# Patient Record
Sex: Male | Born: 1965 | Race: White | Hispanic: No | Marital: Single | Smoking: Former smoker
Health system: Southern US, Community
[De-identification: ages and names within clinical notes are randomized; demographics above are authoritative.]

## PROBLEM LIST (undated history)

## (undated) DIAGNOSIS — F419 Anxiety disorder, unspecified: Secondary | ICD-10-CM

## (undated) DIAGNOSIS — I10 Essential (primary) hypertension: Secondary | ICD-10-CM

## (undated) DIAGNOSIS — G4730 Sleep apnea, unspecified: Secondary | ICD-10-CM

## (undated) DIAGNOSIS — F39 Unspecified mood [affective] disorder: Secondary | ICD-10-CM

## (undated) DIAGNOSIS — F319 Bipolar disorder, unspecified: Secondary | ICD-10-CM

---

## 2015-06-05 ENCOUNTER — Other Ambulatory Visit: Payer: Self-pay | Admitting: Orthopedic Surgery

## 2015-06-05 DIAGNOSIS — M47816 Spondylosis without myelopathy or radiculopathy, lumbar region: Secondary | ICD-10-CM

## 2015-06-10 ENCOUNTER — Ambulatory Visit
Admission: RE | Admit: 2015-06-10 | Discharge: 2015-06-10 | Disposition: A | Discharge status: Home / Self Care | Payer: BLUE CROSS/BLUE SHIELD | Source: Ambulatory Visit | Attending: Orthopedic Surgery | Admitting: Orthopedic Surgery

## 2015-06-10 DIAGNOSIS — M47816 Spondylosis without myelopathy or radiculopathy, lumbar region: Secondary | ICD-10-CM

## 2015-11-24 DIAGNOSIS — R030 Elevated blood-pressure reading, without diagnosis of hypertension: Secondary | ICD-10-CM | POA: Diagnosis not present

## 2015-11-24 DIAGNOSIS — R399 Unspecified symptoms and signs involving the genitourinary system: Secondary | ICD-10-CM | POA: Diagnosis not present

## 2015-11-24 DIAGNOSIS — K5900 Constipation, unspecified: Secondary | ICD-10-CM | POA: Diagnosis not present

## 2015-11-24 DIAGNOSIS — N529 Male erectile dysfunction, unspecified: Secondary | ICD-10-CM | POA: Diagnosis not present

## 2015-12-10 DIAGNOSIS — F411 Generalized anxiety disorder: Secondary | ICD-10-CM | POA: Diagnosis not present

## 2016-01-08 DIAGNOSIS — F411 Generalized anxiety disorder: Secondary | ICD-10-CM | POA: Diagnosis not present

## 2016-01-16 DIAGNOSIS — R400 Somnolence: Secondary | ICD-10-CM | POA: Diagnosis not present

## 2016-02-18 DIAGNOSIS — G4733 Obstructive sleep apnea (adult) (pediatric): Secondary | ICD-10-CM | POA: Diagnosis not present

## 2016-03-18 ENCOUNTER — Emergency Department (HOSPITAL_BASED_OUTPATIENT_CLINIC_OR_DEPARTMENT_OTHER)
Admission: EM | Admit: 2016-03-18 | Discharge: 2016-03-18 | Disposition: A | Discharge status: Home / Self Care | Payer: BLUE CROSS/BLUE SHIELD | Attending: Emergency Medicine | Admitting: Emergency Medicine

## 2016-03-18 ENCOUNTER — Encounter (HOSPITAL_BASED_OUTPATIENT_CLINIC_OR_DEPARTMENT_OTHER): Payer: Self-pay | Admitting: *Deleted

## 2016-03-18 DIAGNOSIS — Z79899 Other long term (current) drug therapy: Secondary | ICD-10-CM | POA: Diagnosis not present

## 2016-03-18 DIAGNOSIS — I10 Essential (primary) hypertension: Secondary | ICD-10-CM | POA: Insufficient documentation

## 2016-03-18 DIAGNOSIS — Z760 Encounter for issue of repeat prescription: Secondary | ICD-10-CM | POA: Diagnosis not present

## 2016-03-18 HISTORY — DX: Essential (primary) hypertension: I10

## 2016-03-18 HISTORY — DX: Sleep apnea, unspecified: G47.30

## 2016-03-18 HISTORY — DX: Unspecified mood (affective) disorder: F39

## 2016-03-18 HISTORY — DX: Bipolar disorder, unspecified: F31.9

## 2016-03-18 HISTORY — DX: Anxiety disorder, unspecified: F41.9

## 2016-03-18 MED ORDER — QUETIAPINE FUMARATE 300 MG PO TABS: 300.0000 mg | ORAL_TABLET | Freq: Every day | ORAL | 0 refills | Status: DC

## 2016-03-18 MED ORDER — VENLAFAXINE HCL ER 75 MG PO CP24: 225.0000 mg | ORAL_CAPSULE | Freq: Every evening | ORAL | 0 refills | Status: DC

## 2016-03-18 NOTE — ED Triage Notes (Addendum)
Pt reports out of effexor xr '150mg'$  and seroquel '300mg'$  x 4 days

## 2016-03-18 NOTE — ED Notes (Addendum)
Dr Pauline Good prescriber of medication unable to be reached for refill of medications. Pt reports he has been taking medications for year and missed appointment with provider today and has been attempting to get refill threw his pharmacy without success. Pt further that his therapist is Brent Bulla and they both can be reached at [phone]. Pt states he needs enough medication to get to August 19 th 2017 which is the date that he is scheduled to see the above providers

## 2016-03-18 NOTE — ED Provider Notes (Signed)
[facility] DEPT [facility] Provider Note   CSN: [id number] Arrival date & time: 03/18/16  1925  First Provider Contact:   First MD Initiated Contact with Patient 03/18/16 2134     By signing my name below, I, Julien Nordmann, attest that this documentation has been prepared under the direction and in the presence of [employer], PA-C.  Electronically Signed: Julien Nordmann, ED Scribe. 03/18/16. 9:41 PM.    History   Chief Complaint Chief Complaint  Patient presents with  . Medication Refill   The history is provided by the patient. No language interpreter was used.   HPI Comments: Kevin Hunter is a 50 y.o. male who has a PMHx of anxiety, bipolar disorder, mood disorder, HTN, depression, and sleep apnea presents to the Emergency Department presenting with a request for a medication refill. Pt reports he ran out of effexor xr 150 mg and seroquel 300 mg four days ago. He says he has not been able to rest well this past week after being recently diagnosed with sleep apnea. He notes having an appointment with Dr. Pauline Good today who prescribes his medication but was unable to get his medication today due to being 10 minutes late. He states he has been been very unstable emotionally since he has been off of his medication. He denies HI and SI. Pt does not currently have any pain.  Past Medical History:  Diagnosis Date  . Anxiety   . Bipolar 1 disorder ([place])   . Hypertension   . Mood disorder ([place])   . Sleep apnea     There are no active problems to display for this patient.   History reviewed. No pertinent surgical history.     Home Medications    Prior to Admission medications   Medication Sig Start Date End Date Taking? Authorizing Provider  amLODipine (NORVASC) 5 MG tablet Take 5 mg by mouth daily.   Yes Historical Provider, MD  clonazePAM (KLONOPIN) 1 MG tablet Take 1 mg by mouth 2 (two) times daily.   Yes Historical Provider, MD  linaclotide (LINZESS) 145 MCG CAPS  capsule Take 145 mcg by mouth daily before breakfast.   Yes Historical Provider, MD  tolterodine (DETROL) 2 MG tablet Take 4 mg by mouth 2 (two) times daily.   Yes Historical Provider, MD  traZODone (DESYREL) 100 MG tablet Take 100 mg by mouth at bedtime.   Yes Historical Provider, MD  QUEtiapine (SEROQUEL) 300 MG tablet Take 1 tablet (300 mg total) by mouth at bedtime. 03/18/16   Kalman Drape, PA  venlafaxine XR (EFFEXOR XR) 75 MG 24 hr capsule Take 3 capsules (225 mg total) by mouth every evening. 03/18/16   Kalman Drape, PA    Family History No family history on file.  Social History Social History  Substance Use Topics  . Smoking status: Not on file  . Smokeless tobacco: Not on file  . Alcohol use Not on file     Allergies   Review of patient's allergies indicates not on file.   Review of Systems Review of Systems  Respiratory: Negative for shortness of breath.   Cardiovascular: Negative for chest pain.  Neurological: Negative for headaches.  Psychiatric/Behavioral: Negative for suicidal ideas.     Physical Exam Updated Vital Signs BP 164/100   Pulse 92   Temp 98.1 F (36.7 C) (Oral)   Resp 16   Ht '5\' 10"'$  (1.778 m)   Wt 270 lb (122.5 kg)   SpO2 97%  BMI 38.74 kg/m   Physical Exam  Constitutional: He appears well-developed and well-nourished. No distress.  HENT:  Head: Normocephalic and atraumatic.  Eyes: Conjunctivae are normal.  Pulmonary/Chest: Effort normal. No respiratory distress.  Musculoskeletal: Normal range of motion.  Neurological: He is alert. Coordination normal.  Skin: Skin is warm and dry. He is not diaphoretic.  Psychiatric: He has a normal mood and affect. His behavior is normal.  Nursing note and vitals reviewed.    ED Treatments / Results  DIAGNOSTIC STUDIES: Oxygen Saturation is 97% on RA, normal by my interpretation.  COORDINATION OF CARE:  9:40 PM Discussed treatment plan with pt at bedside and pt agreed to  plan.  Labs (all labs ordered are listed, but only abnormal results are displayed) Labs Reviewed - No data to display  EKG  EKG Interpretation None       Radiology No results found.  Procedures Procedures (including critical care time)  Medications Ordered in ED Medications - No data to display   Initial Impression / Assessment and Plan / ED Course  I have reviewed the triage vital signs and the nursing notes.  Pertinent labs & imaging results that were available during my care of the patient were reviewed by me and considered in my medical decision making (see chart for details).  Clinical Course    Pt here for refill of medication. Medication is not a controlled substance. Will refill medication here. Discussed need to follow up with PCP in 2-3 days.  Pt denies SI or HI. Pt is safe for discharge at this time.  I personally performed the services described in this documentation, which was scribed in my presence. The recorded information has been reviewed and is accurate.     Final Clinical Impressions(s) / ED Diagnoses   Final diagnoses:  Medication refill    New Prescriptions Discharge Medication List as of 03/18/2016  9:47 PM    START taking these medications   Details  venlafaxine XR (EFFEXOR XR) 75 MG 24 hr capsule Take 3 capsules (225 mg total) by mouth every evening., Starting Thu 03/18/2016, [place], [place] 03/18/16 2356    Leo Grosser, MD 03/19/16 [phone]

## 2016-03-18 NOTE — Discharge Instructions (Signed)
Follow up with your primary care provider to have your prescriptions refilled.  Return to the emergency department with any concerning symptoms such as chest pain, shortness of breath, fever, nausea or vomiting.

## 2016-03-31 DIAGNOSIS — F411 Generalized anxiety disorder: Secondary | ICD-10-CM | POA: Diagnosis not present

## 2016-04-08 DIAGNOSIS — G4733 Obstructive sleep apnea (adult) (pediatric): Secondary | ICD-10-CM | POA: Diagnosis not present

## 2016-06-24 DIAGNOSIS — F411 Generalized anxiety disorder: Secondary | ICD-10-CM | POA: Diagnosis not present

## 2016-08-02 DIAGNOSIS — G4700 Insomnia, unspecified: Secondary | ICD-10-CM | POA: Diagnosis not present

## 2016-08-02 DIAGNOSIS — G4733 Obstructive sleep apnea (adult) (pediatric): Secondary | ICD-10-CM | POA: Diagnosis not present

## 2016-09-03 DIAGNOSIS — Z23 Encounter for immunization: Secondary | ICD-10-CM | POA: Diagnosis not present

## 2016-09-21 DIAGNOSIS — F411 Generalized anxiety disorder: Secondary | ICD-10-CM | POA: Diagnosis not present

## 2016-12-03 DIAGNOSIS — R399 Unspecified symptoms and signs involving the genitourinary system: Secondary | ICD-10-CM | POA: Diagnosis not present

## 2016-12-03 DIAGNOSIS — I10 Essential (primary) hypertension: Secondary | ICD-10-CM | POA: Diagnosis not present

## 2016-12-03 DIAGNOSIS — N529 Male erectile dysfunction, unspecified: Secondary | ICD-10-CM | POA: Diagnosis not present

## 2016-12-03 DIAGNOSIS — K5900 Constipation, unspecified: Secondary | ICD-10-CM | POA: Diagnosis not present

## 2017-01-04 DIAGNOSIS — F411 Generalized anxiety disorder: Secondary | ICD-10-CM | POA: Diagnosis not present

## 2017-01-04 DIAGNOSIS — F331 Major depressive disorder, recurrent, moderate: Secondary | ICD-10-CM | POA: Diagnosis not present

## 2017-02-07 DIAGNOSIS — K529 Noninfective gastroenteritis and colitis, unspecified: Secondary | ICD-10-CM | POA: Diagnosis not present

## 2017-02-07 DIAGNOSIS — Z1211 Encounter for screening for malignant neoplasm of colon: Secondary | ICD-10-CM | POA: Diagnosis not present

## 2017-02-07 DIAGNOSIS — K635 Polyp of colon: Secondary | ICD-10-CM | POA: Diagnosis not present

## 2017-02-10 DIAGNOSIS — Z1211 Encounter for screening for malignant neoplasm of colon: Secondary | ICD-10-CM | POA: Diagnosis not present

## 2017-02-10 DIAGNOSIS — K635 Polyp of colon: Secondary | ICD-10-CM | POA: Diagnosis not present

## 2017-02-10 DIAGNOSIS — K529 Noninfective gastroenteritis and colitis, unspecified: Secondary | ICD-10-CM | POA: Diagnosis not present

## 2017-04-11 DIAGNOSIS — F411 Generalized anxiety disorder: Secondary | ICD-10-CM | POA: Diagnosis not present

## 2017-04-11 DIAGNOSIS — F331 Major depressive disorder, recurrent, moderate: Secondary | ICD-10-CM | POA: Diagnosis not present

## 2017-06-29 DIAGNOSIS — N529 Male erectile dysfunction, unspecified: Secondary | ICD-10-CM | POA: Diagnosis not present

## 2017-06-29 DIAGNOSIS — R21 Rash and other nonspecific skin eruption: Secondary | ICD-10-CM | POA: Diagnosis not present

## 2017-06-29 DIAGNOSIS — I10 Essential (primary) hypertension: Secondary | ICD-10-CM | POA: Diagnosis not present

## 2017-06-29 DIAGNOSIS — R0981 Nasal congestion: Secondary | ICD-10-CM | POA: Diagnosis not present

## 2017-08-01 DIAGNOSIS — F411 Generalized anxiety disorder: Secondary | ICD-10-CM | POA: Diagnosis not present

## 2017-12-05 DIAGNOSIS — I10 Essential (primary) hypertension: Secondary | ICD-10-CM | POA: Diagnosis not present

## 2017-12-05 DIAGNOSIS — R399 Unspecified symptoms and signs involving the genitourinary system: Secondary | ICD-10-CM | POA: Diagnosis not present

## 2017-12-05 DIAGNOSIS — N529 Male erectile dysfunction, unspecified: Secondary | ICD-10-CM | POA: Diagnosis not present

## 2017-12-05 DIAGNOSIS — K5900 Constipation, unspecified: Secondary | ICD-10-CM | POA: Diagnosis not present

## 2017-12-07 DIAGNOSIS — F319 Bipolar disorder, unspecified: Secondary | ICD-10-CM | POA: Diagnosis not present

## 2019-01-10 DIAGNOSIS — K5900 Constipation, unspecified: Secondary | ICD-10-CM | POA: Diagnosis not present

## 2019-01-10 DIAGNOSIS — I10 Essential (primary) hypertension: Secondary | ICD-10-CM | POA: Diagnosis not present

## 2019-01-10 DIAGNOSIS — R399 Unspecified symptoms and signs involving the genitourinary system: Secondary | ICD-10-CM | POA: Diagnosis not present

## 2019-01-10 DIAGNOSIS — N529 Male erectile dysfunction, unspecified: Secondary | ICD-10-CM | POA: Diagnosis not present

## 2019-03-22 DIAGNOSIS — F319 Bipolar disorder, unspecified: Secondary | ICD-10-CM | POA: Diagnosis not present

## 2019-06-19 DIAGNOSIS — F319 Bipolar disorder, unspecified: Secondary | ICD-10-CM | POA: Diagnosis not present

## 2019-07-16 DIAGNOSIS — R399 Unspecified symptoms and signs involving the genitourinary system: Secondary | ICD-10-CM | POA: Diagnosis not present

## 2019-07-16 DIAGNOSIS — K5900 Constipation, unspecified: Secondary | ICD-10-CM | POA: Diagnosis not present

## 2019-07-16 DIAGNOSIS — N529 Male erectile dysfunction, unspecified: Secondary | ICD-10-CM | POA: Diagnosis not present

## 2019-07-16 DIAGNOSIS — I10 Essential (primary) hypertension: Secondary | ICD-10-CM | POA: Diagnosis not present

## 2019-08-30 DIAGNOSIS — I10 Essential (primary) hypertension: Secondary | ICD-10-CM | POA: Diagnosis not present

## 2019-09-18 DIAGNOSIS — F319 Bipolar disorder, unspecified: Secondary | ICD-10-CM | POA: Diagnosis not present

## 2019-12-18 DIAGNOSIS — F319 Bipolar disorder, unspecified: Secondary | ICD-10-CM | POA: Diagnosis not present

## 2020-03-19 DIAGNOSIS — F319 Bipolar disorder, unspecified: Secondary | ICD-10-CM | POA: Diagnosis not present

## 2020-05-06 DIAGNOSIS — N529 Male erectile dysfunction, unspecified: Secondary | ICD-10-CM | POA: Diagnosis not present

## 2020-05-06 DIAGNOSIS — R399 Unspecified symptoms and signs involving the genitourinary system: Secondary | ICD-10-CM | POA: Diagnosis not present

## 2020-05-06 DIAGNOSIS — I10 Essential (primary) hypertension: Secondary | ICD-10-CM | POA: Diagnosis not present

## 2020-05-06 DIAGNOSIS — Z23 Encounter for immunization: Secondary | ICD-10-CM | POA: Diagnosis not present

## 2020-05-06 DIAGNOSIS — K5900 Constipation, unspecified: Secondary | ICD-10-CM | POA: Diagnosis not present

## 2021-05-20 ENCOUNTER — Emergency Department (HOSPITAL_BASED_OUTPATIENT_CLINIC_OR_DEPARTMENT_OTHER): Payer: Managed Care, Other (non HMO)

## 2021-05-20 ENCOUNTER — Inpatient Hospital Stay (HOSPITAL_BASED_OUTPATIENT_CLINIC_OR_DEPARTMENT_OTHER)
Admission: EM | Admit: 2021-05-20 | Discharge: 2021-05-23 | DRG: 166 | Disposition: A | Discharge status: Home / Self Care | Payer: Managed Care, Other (non HMO) | Attending: Internal Medicine | Admitting: Internal Medicine

## 2021-05-20 ENCOUNTER — Other Ambulatory Visit: Payer: Self-pay

## 2021-05-20 ENCOUNTER — Encounter (HOSPITAL_BASED_OUTPATIENT_CLINIC_OR_DEPARTMENT_OTHER): Payer: Self-pay

## 2021-05-20 DIAGNOSIS — F411 Generalized anxiety disorder: Secondary | ICD-10-CM | POA: Diagnosis present

## 2021-05-20 DIAGNOSIS — C3411 Malignant neoplasm of upper lobe, right bronchus or lung: Principal | ICD-10-CM | POA: Diagnosis present

## 2021-05-20 DIAGNOSIS — C7931 Secondary malignant neoplasm of brain: Secondary | ICD-10-CM | POA: Diagnosis present

## 2021-05-20 DIAGNOSIS — G936 Cerebral edema: Secondary | ICD-10-CM | POA: Diagnosis present

## 2021-05-20 DIAGNOSIS — Z79899 Other long term (current) drug therapy: Secondary | ICD-10-CM

## 2021-05-20 DIAGNOSIS — Z6841 Body Mass Index (BMI) 40.0 and over, adult: Secondary | ICD-10-CM

## 2021-05-20 DIAGNOSIS — G4730 Sleep apnea, unspecified: Secondary | ICD-10-CM | POA: Diagnosis present

## 2021-05-20 DIAGNOSIS — Z87891 Personal history of nicotine dependence: Secondary | ICD-10-CM

## 2021-05-20 DIAGNOSIS — F319 Bipolar disorder, unspecified: Secondary | ICD-10-CM | POA: Diagnosis present

## 2021-05-20 DIAGNOSIS — Z419 Encounter for procedure for purposes other than remedying health state, unspecified: Secondary | ICD-10-CM

## 2021-05-20 DIAGNOSIS — I10 Essential (primary) hypertension: Secondary | ICD-10-CM | POA: Diagnosis present

## 2021-05-20 DIAGNOSIS — R918 Other nonspecific abnormal finding of lung field: Secondary | ICD-10-CM

## 2021-05-20 DIAGNOSIS — E871 Hypo-osmolality and hyponatremia: Secondary | ICD-10-CM | POA: Diagnosis present

## 2021-05-20 DIAGNOSIS — D496 Neoplasm of unspecified behavior of brain: Secondary | ICD-10-CM | POA: Insufficient documentation

## 2021-05-20 DIAGNOSIS — Z880 Allergy status to penicillin: Secondary | ICD-10-CM

## 2021-05-20 DIAGNOSIS — Z9889 Other specified postprocedural states: Secondary | ICD-10-CM

## 2021-05-20 DIAGNOSIS — C799 Secondary malignant neoplasm of unspecified site: Secondary | ICD-10-CM

## 2021-05-20 DIAGNOSIS — Z20822 Contact with and (suspected) exposure to covid-19: Secondary | ICD-10-CM | POA: Diagnosis present

## 2021-05-20 DIAGNOSIS — R519 Headache, unspecified: Secondary | ICD-10-CM

## 2021-05-20 DIAGNOSIS — F419 Anxiety disorder, unspecified: Secondary | ICD-10-CM | POA: Diagnosis present

## 2021-05-20 DIAGNOSIS — E669 Obesity, unspecified: Secondary | ICD-10-CM | POA: Diagnosis present

## 2021-05-20 DIAGNOSIS — R040 Epistaxis: Secondary | ICD-10-CM | POA: Diagnosis not present

## 2021-05-20 LAB — CBC WITH DIFFERENTIAL/PLATELET
Abs Immature Granulocytes: 0.05 10*3/uL (ref 0.00–0.07)
Basophils Absolute: 0.1 10*3/uL (ref 0.0–0.1)
Basophils Relative: 1 %
Eosinophils Absolute: 0.2 10*3/uL (ref 0.0–0.5)
Eosinophils Relative: 2 %
HCT: 42.1 % (ref 39.0–52.0)
Hemoglobin: 14.5 g/dL (ref 13.0–17.0)
Immature Granulocytes: 1 %
Lymphocytes Relative: 15 %
Lymphs Abs: 1.5 10*3/uL (ref 0.7–4.0)
MCH: 29.9 pg (ref 26.0–34.0)
MCHC: 34.4 g/dL (ref 30.0–36.0)
MCV: 86.8 fL (ref 80.0–100.0)
Monocytes Absolute: 0.9 10*3/uL (ref 0.1–1.0)
Monocytes Relative: 9 %
Neutro Abs: 7.2 10*3/uL (ref 1.7–7.7)
Neutrophils Relative %: 72 %
Platelets: 261 10*3/uL (ref 150–400)
RBC: 4.85 MIL/uL (ref 4.22–5.81)
RDW: 14.4 % (ref 11.5–15.5)
WBC: 9.7 10*3/uL (ref 4.0–10.5)
nRBC: 0 % (ref 0.0–0.2)

## 2021-05-20 LAB — COMPREHENSIVE METABOLIC PANEL
ALT: 24 U/L (ref 0–44)
AST: 22 U/L (ref 15–41)
Albumin: 4.1 g/dL (ref 3.5–5.0)
Alkaline Phosphatase: 72 U/L (ref 38–126)
Anion gap: 7 (ref 5–15)
BUN: 16 mg/dL (ref 6–20)
CO2: 24 mmol/L (ref 22–32)
Calcium: 8.8 mg/dL — ABNORMAL LOW (ref 8.9–10.3)
Chloride: 102 mmol/L (ref 98–111)
Creatinine, Ser: 1.14 mg/dL (ref 0.61–1.24)
GFR, Estimated: >60 mL/min (ref >60)
Glucose, Bld: 91 mg/dL (ref 70–99)
Potassium: 3.4 mmol/L — ABNORMAL LOW (ref 3.5–5.1)
Sodium: 133 mmol/L — ABNORMAL LOW (ref 135–145)
Total Bilirubin: 0.3 mg/dL (ref 0.3–1.2)
Total Protein: 7.8 g/dL (ref 6.5–8.1)

## 2021-05-20 LAB — RESP PANEL BY RT-PCR (FLU A&B, COVID) ARPGX2
Influenza A by PCR: NEGATIVE
Influenza B by PCR: NEGATIVE
SARS Coronavirus 2 by RT PCR: NEGATIVE

## 2021-05-20 LAB — TSH: TSH: 1.802 u[IU]/mL (ref 0.350–4.500)

## 2021-05-20 LAB — LIPASE, BLOOD: Lipase: 31 U/L (ref 11–51)

## 2021-05-20 IMAGING — CT CT HEAD W/O CM
3 series · 15 of 47 positions shown, 18 images · non-contrast
Comparison: None.

CLINICAL DATA: Three weeks of headache, dizziness, trouble writing
and typing

EXAM:
CT HEAD WITHOUT CONTRAST
TECHNIQUE: Contiguous axial images were obtained from the base of the skull
through the vertex without intravenous contrast.

[Series 2: head wo · axial · 0.44mm/px · z∈[-178,-43]mm · 9 of 33 slices shown, 12 images]
[im 3/33  brain]
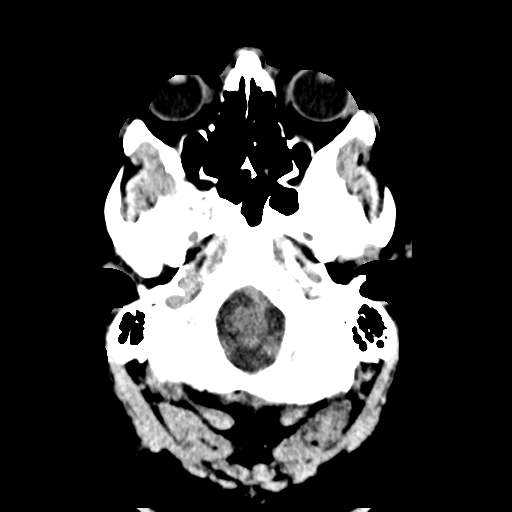
[im 3/33  bone]
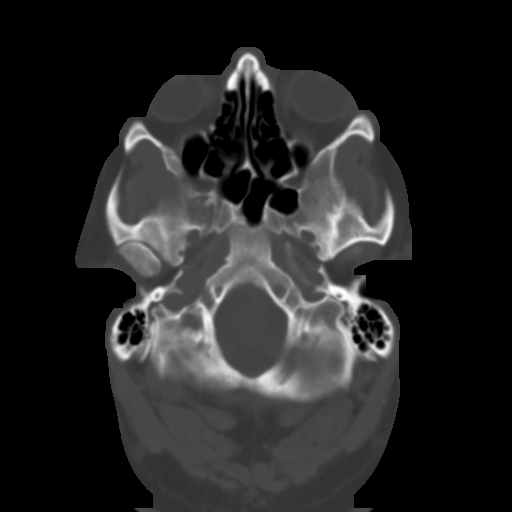
[im 6/33  brain]
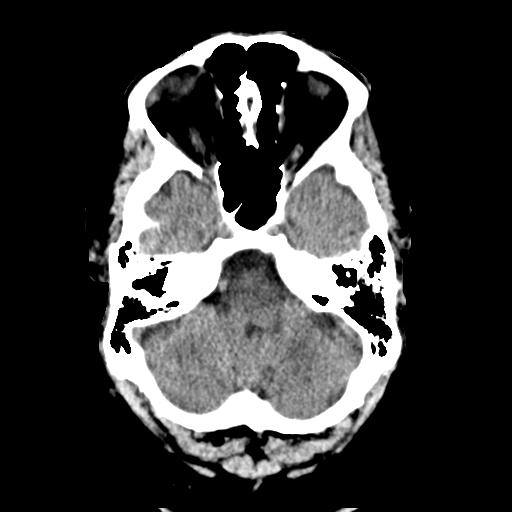
[im 9/33  brain]
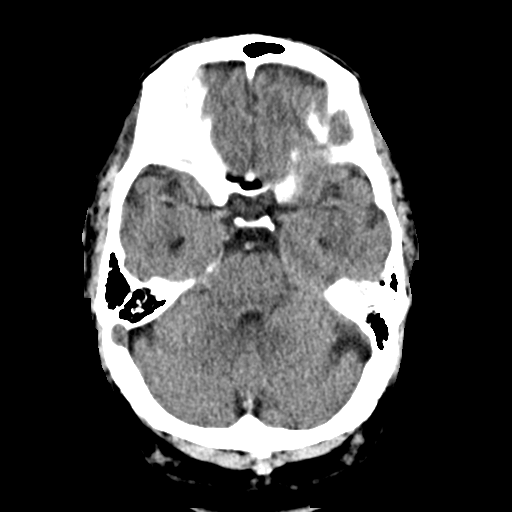
[im 13/33  brain]
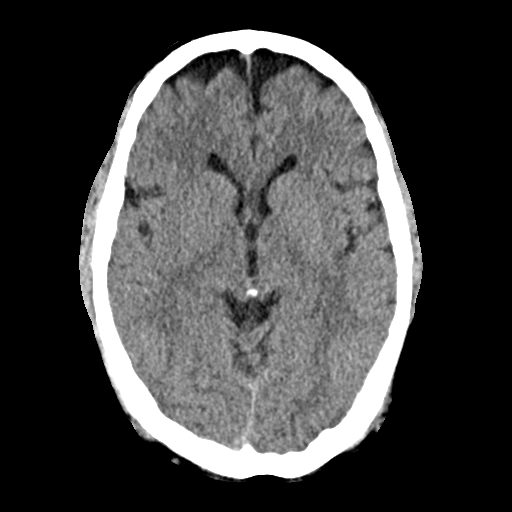
[im 17/33  brain]
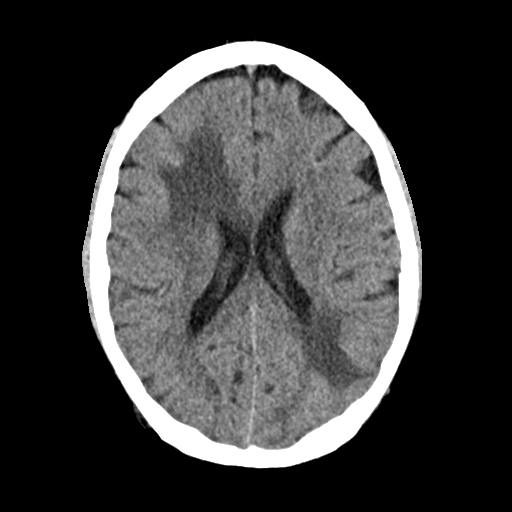
[im 17/33  bone]
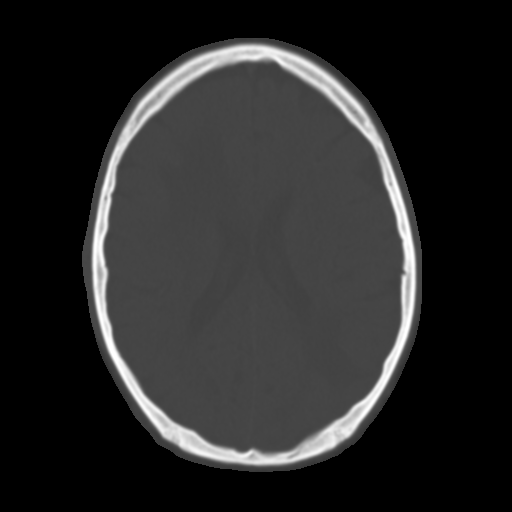
[im 20/33  brain]
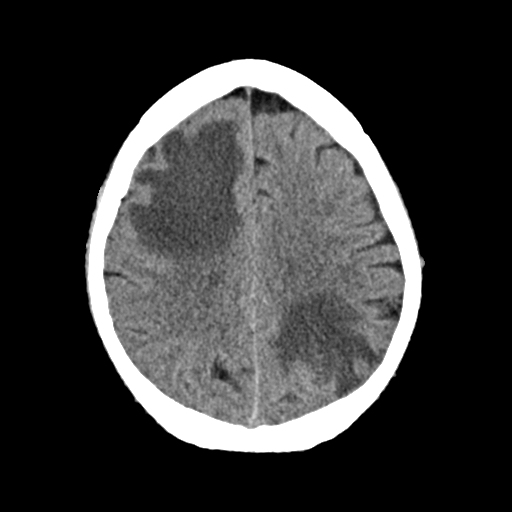
[im 24/33  brain]
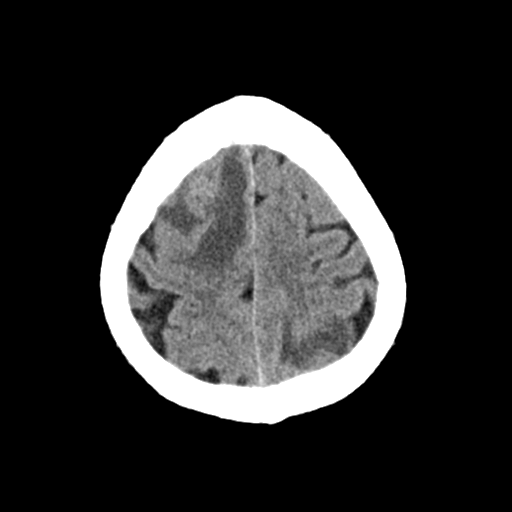
[im 27/33  brain]
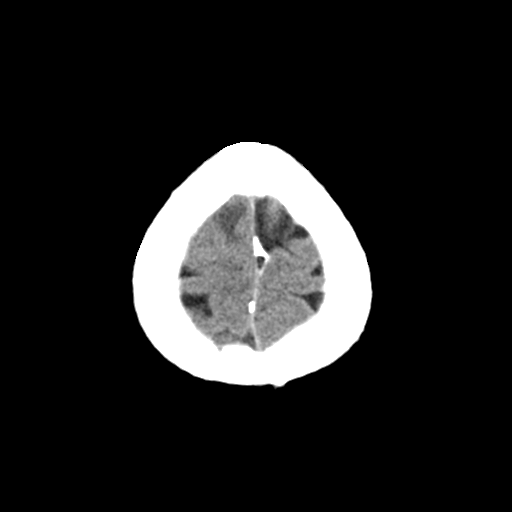
[im 30/33  brain]
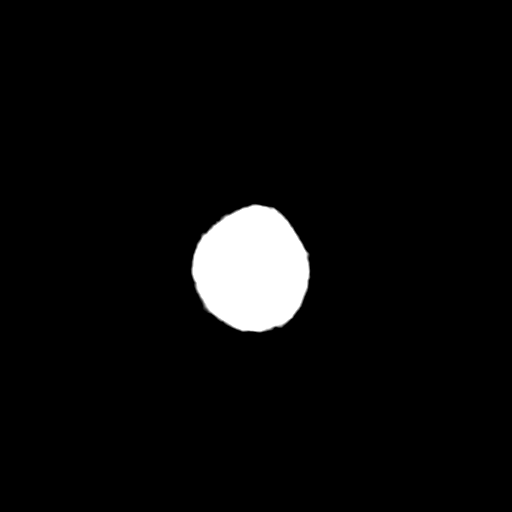
[im 30/33  bone]
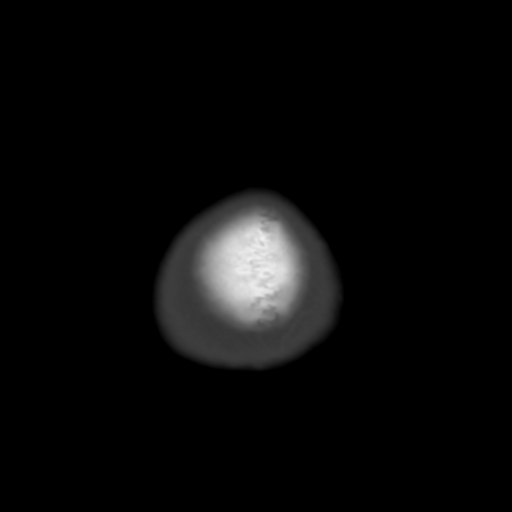

[Series 4: coronal soft · coronal · 0.34mm/px · 3 of 72 slices shown]
[im 24/72  brain]
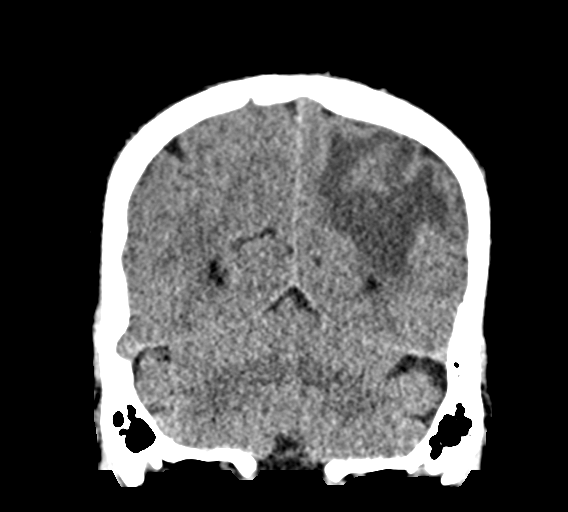
[im 32/72  brain]
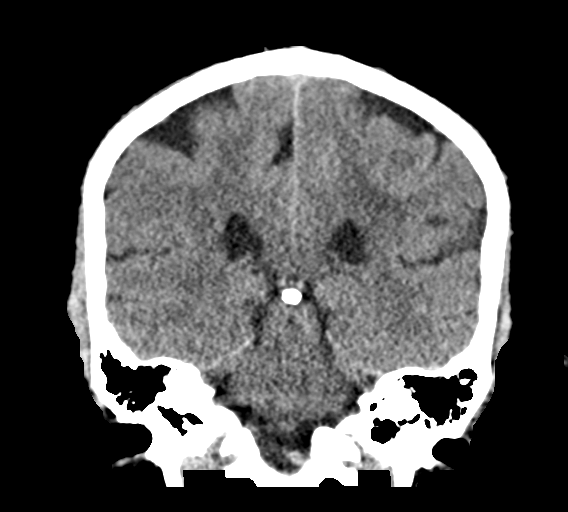
[im 40/72  brain]
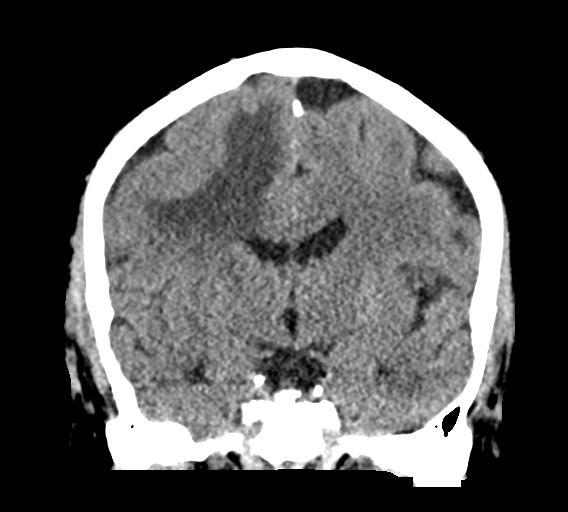

[Series 5: sag soft · sagittal · 0.34mm/px · 3 of 58 slices shown]
[im 20/58  brain]
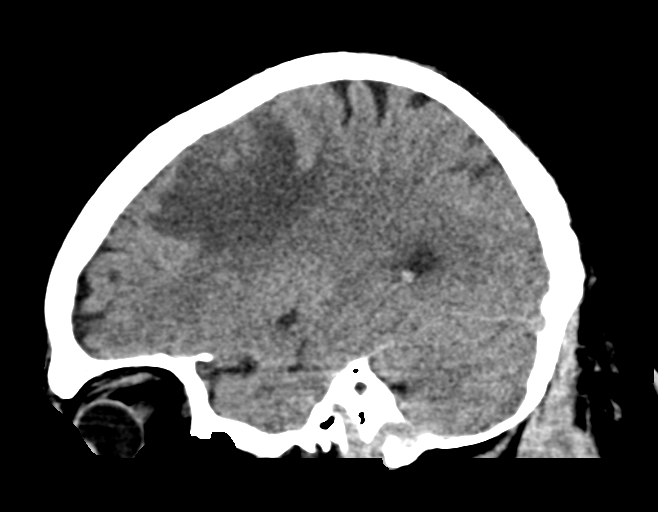
[im 29/58  brain]
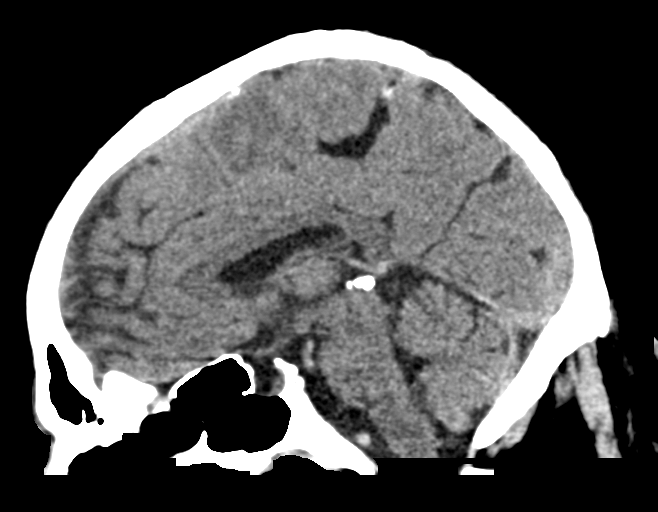
[im 39/58  brain]
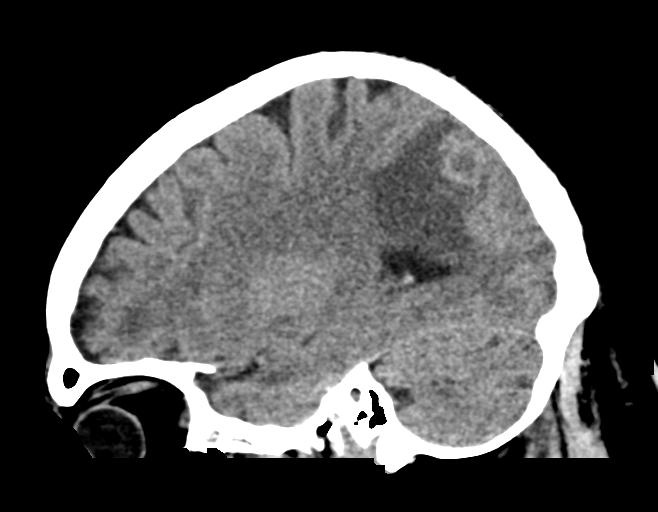

[15 of 47 positions shown; findings below may reference images not displayed]

FINDINGS: Brain: There is a 1.5 cm centrally hypodense lesion in the right
frontal lobe with significant surrounding vasogenic edema with
sparing of the overlying white matter. There is a similar appearing
lesion in the left parietal lobe measuring 1.2 cm with surrounding
edema.

There is mild vasogenic edema in the left inferior frontal gyrus
(4-60).

There is no evidence of acute intracranial hemorrhage or infarct.
There is no extra-axial fluid collection

Mass effect from the above described edema slightly effaces the body
of the right lateral ventricle. There is no midline shift.

Vascular: No hyperdense vessel or unexpected calcification.

Skull: Normal. Negative for fracture or focal lesion.

Sinuses/Orbits: The imaged paranasal sinuses are clear. The globes
and orbits are unremarkable.

Other: None.
IMPRESSION: Discrete parenchymal lesions in the right frontal and left parietal
lobes with marked surrounding vasogenic edema described above most
suspicious for metastases. Mild vasogenic edema in the left inferior
frontal gyrus raises suspicion for an additional lesion in this
location. Brain MRI with and without contrast is recommended for
further characterization.

## 2021-05-20 MED ORDER — CLONAZEPAM 0.5 MG PO TABS
1.0000 mg | ORAL_TABLET | Freq: Two times a day (BID) | ORAL | Status: DC
  Filled 2021-05-20: qty 1
  Filled 2021-05-20: qty 2

## 2021-05-20 MED ORDER — DEXAMETHASONE SODIUM PHOSPHATE 10 MG/ML IJ SOLN
10.0000 mg | Freq: Once | INTRAMUSCULAR | Status: AC
  Administered 2021-05-20: 10 mg via INTRAVENOUS
  Filled 2021-05-20: qty 1

## 2021-05-20 MED ORDER — LORAZEPAM 0.5 MG PO TABS
0.5000 mg | ORAL_TABLET | Freq: Four times a day (QID) | ORAL | Status: DC | PRN
  Administered 2021-05-20: 0.5 mg via ORAL
  Filled 2021-05-20: qty 1

## 2021-05-20 MED ORDER — SODIUM CHLORIDE 0.9 % IV SOLN: 2000.0000 mg | Freq: Once | INTRAVENOUS | Status: DC

## 2021-05-20 MED ORDER — LEVETIRACETAM IN NACL 1000 MG/100ML IV SOLN
1000.0000 mg | Freq: Once | INTRAVENOUS | Status: AC
  Administered 2021-05-20: 1000 mg via INTRAVENOUS
  Filled 2021-05-20: qty 100

## 2021-05-20 MED ORDER — IOHEXOL 300 MG/ML  SOLN
100.0000 mL | Freq: Once | INTRAMUSCULAR | Status: AC | PRN
  Administered 2021-05-20: 100 mL via INTRAVENOUS

## 2021-05-20 MED ORDER — QUETIAPINE FUMARATE 100 MG PO TABS
300.0000 mg | ORAL_TABLET | Freq: Every day | ORAL | Status: DC
  Administered 2021-05-20 – 2021-05-22 (×3): 300 mg via ORAL
  Filled 2021-05-20 (×3): qty 3

## 2021-05-20 MED ORDER — AMLODIPINE BESYLATE 5 MG PO TABS
5.0000 mg | ORAL_TABLET | Freq: Every day | ORAL | Status: DC
  Administered 2021-05-20 – 2021-05-23 (×4): 5 mg via ORAL
  Filled 2021-05-20 (×4): qty 1

## 2021-05-20 MED ORDER — DEXAMETHASONE SODIUM PHOSPHATE 4 MG/ML IJ SOLN
4.0000 mg | Freq: Four times a day (QID) | INTRAMUSCULAR | Status: DC
  Administered 2021-05-20 – 2021-05-21 (×2): 4 mg via INTRAVENOUS
  Filled 2021-05-20 (×3): qty 1

## 2021-05-20 MED ORDER — TRAZODONE HCL 100 MG PO TABS
100.0000 mg | ORAL_TABLET | Freq: Every day | ORAL | Status: DC
  Administered 2021-05-20 – 2021-05-22 (×3): 100 mg via ORAL
  Filled 2021-05-20 (×2): qty 1
  Filled 2021-05-20: qty 2

## 2021-05-20 MED ORDER — LEVETIRACETAM IN NACL 500 MG/100ML IV SOLN
500.0000 mg | Freq: Two times a day (BID) | INTRAVENOUS | Status: DC
  Filled 2021-05-20 (×2): qty 100

## 2021-05-20 MED ORDER — VENLAFAXINE HCL ER 75 MG PO CP24
225.0000 mg | ORAL_CAPSULE | Freq: Every evening | ORAL | Status: DC
  Filled 2021-05-20: qty 1

## 2021-05-20 NOTE — ED Triage Notes (Signed)
Pt c/o HA, dizziness, and misspelling words when typing x ~1 month-states he was seen by PCP-no tests and was advised to increase trazadone-pt states today sx was worse while at work today-NAD-steady gait

## 2021-05-20 NOTE — ED Provider Notes (Signed)
Emergency Department Provider Note   I have reviewed the triage vital signs and the nursing notes.   HISTORY  Chief Complaint Headache   HPI Kevin Hunter is a 55 y.o. male with PMH reviewed below presents to the emergency department with intermittent right-sided headache and various neurologic complaints.  Patient has noticed intermittent headaches for the past month which is unusual to him.  He frequently gets a throbbing, right-sided headache in the right forehead area.  Denies any sudden onset, maximal intensity headache symptoms.  No photophobia.  No nausea or vomiting.  He was occasionally feeling lightheaded but denies chest pain or heart palpitations.  He tells me that he noticed around 2 weeks ago that he was having a more cautious gait and thought maybe his left leg was more weak than the right.  Around this time he also noticed that he would have difficulty spelling certain words which was unusual for him.  He would be taking notes and realized that he was misspelling multiple things.  He was at a meeting today and was typing on a screen in front of multiple people who noticed him misspelling multiple words and ultimately asked him what was going on.  This prompted him to come to the emergency department.  He was not having a headache at the time of spelling issues.  He did have a headache earlier this morning but it improved with Sudafed.  Denies any URI symptoms.  Denies numbness, vision change, speech change.   Past Medical History:  Diagnosis Date   Anxiety    Bipolar 1 disorder ([place])    Hypertension    Mood disorder ([place])    Sleep apnea     Patient Active Problem List   Diagnosis Date Noted   Brain metastasis ([place]) 05/21/2021   Hyponatremia 05/21/2021   Bipolar 1 disorder ([place])    Anxiety    Hypertension    Brain tumor ([place]) 05/20/2021     History reviewed. No pertinent surgical history.  Allergies Penicillins  History reviewed. No pertinent family  history.  Social History Social History   Tobacco Use   Smoking status: Former    Types: Cigarettes   Smokeless tobacco: Never  Vaping Use   Vaping Use: Every day  Substance Use Topics   Alcohol use: Never   Drug use: Never    Review of Systems  Constitutional: No fever/chills Eyes: No visual changes. ENT: No sore throat. Cardiovascular: Denies chest pain. Respiratory: Denies shortness of breath. Gastrointestinal: No abdominal pain.  No nausea, no vomiting.  No diarrhea.  No constipation. Genitourinary: Negative for dysuria. Musculoskeletal: Negative for back pain. Skin: Negative for rash. Neurological: Negative for focal weakness or numbness. Positive HA. Positive issues with writing/typing.   10-point ROS otherwise negative.  ____________________________________________   PHYSICAL EXAM:  VITAL SIGNS: ED Triage Vitals  Enc Vitals Group     BP 05/20/21 1207 (!) 164/110     Pulse Rate 05/20/21 1207 83     Resp 05/20/21 1207 20     Temp 05/20/21 1207 98.3 F (36.8 C)     Temp Source 05/20/21 1207 Oral     SpO2 05/20/21 1207 95 %     Weight 05/20/21 1205 296 lb (134.3 kg)     Height 05/20/21 1205 5\' 10"  (1.778 m)   Constitutional: Alert and oriented. Well appearing and in no acute distress. Eyes: Conjunctivae are normal.  Head: Atraumatic. Nose: No congestion/rhinnorhea. Mouth/Throat: Mucous membranes are moist.   Neck:  No stridor.  Cardiovascular: Normal rate, regular rhythm. Good peripheral circulation. Grossly normal heart sounds.   Respiratory: Normal respiratory effort.  No retractions. Lungs CTAB. Gastrointestinal: Soft and nontender. No distention.  Musculoskeletal: No lower extremity tenderness nor edema. No gross deformities of extremities. Neurologic:  Normal speech and language. No gross focal neurologic deficits are appreciated. No facial asymmetry. 4+/5 strength in the LUE and LLE. 5/5 on the RLE.  Skin:  Skin is warm, dry and intact. No rash  noted.  ____________________________________________   LABS (all labs ordered are listed, but only abnormal results are displayed)  Labs Reviewed  COMPREHENSIVE METABOLIC PANEL - Abnormal; Notable for the following components:      Result Value   Sodium 133 (*)    Potassium 3.4 (*)    Calcium 8.8 (*)    All other components within normal limits  COMPREHENSIVE METABOLIC PANEL - Abnormal; Notable for the following components:   Glucose, Bld 122 (*)    Total Protein 8.3 (*)    All other components within normal limits  CBC - Abnormal; Notable for the following components:   WBC 11.3 (*)    All other components within normal limits  RESP PANEL BY RT-PCR (FLU A&B, COVID) ARPGX2  LIPASE, BLOOD  CBC WITH DIFFERENTIAL/PLATELET  TSH  MAGNESIUM  OSMOLALITY  HIV ANTIBODY (ROUTINE TESTING W REFLEX)  URINALYSIS, ROUTINE W REFLEX MICROSCOPIC  SODIUM, URINE, RANDOM  CREATININE, URINE, RANDOM  OSMOLALITY, URINE   ____________________________________________  RADIOLOGY  CT head:   IMPRESSION: Discrete parenchymal lesions in the right frontal and left parietal lobes with marked surrounding vasogenic edema described above most suspicious for metastases. Mild vasogenic edema in the left inferior frontal gyrus raises suspicion for an additional lesion in this location. Brain MRI with and without contrast is recommended for further characterization.   Electronically Signed By: Valetta Mole M.D. On: 05/20/2021 13:49 ____________________________________________   PROCEDURES  Procedure(s) performed:   Procedures  None ____________________________________________   INITIAL IMPRESSION / ASSESSMENT AND PLAN / ED COURSE  Pertinent labs & imaging results that were available during my care of the patient were reviewed by me and considered in my medical decision making (see chart for details).   Patient presents emergency department with 2 to 4 weeks of intermittent headaches with  neurologic symptoms as described above.  He has no significant focal neurologic deficits.  Some subjective weakness in the left leg at times but difficult to fully appreciate this on exam.  Plan for initial noncontrast head CT imaging.  No history features to suspect subarachnoid hemorrhage, intracranial mass.  Stroke does remain on my differential.  It sounds like his issues with spelling and taking notes has been ongoing for the past 2 weeks but ultimately noticed by coworkers today which brought him to care.   02:15 PM  Discussed the case with Dr. Malen Gauze.  Advises on Decadron and Keppra dosing.  Will send patient for CT chest/abdomen/pelvis to look for area of primary cancer, presuming metastatic disease.  Patient will need transfer/admit for MRI brain with/without contrast along with oncology and neurosurgical consultation. No airway concern. Updated patient on CT findings and plan moving forward.   Discussed patient's case with [facility] to request admission. Patient and family (if present) updated with plan. Care transferred to [facility] service.  I reviewed all nursing notes, vitals, pertinent old records, EKGs, labs, imaging (as available).  ____________________________________________  FINAL CLINICAL IMPRESSION(S) / ED DIAGNOSES  Final diagnoses:  Metastasis ([place])  Acute nonintractable headache, unspecified  headache type     MEDICATIONS GIVEN DURING THIS VISIT:  Medications  dexamethasone (DECADRON) injection 4 mg (4 mg Intravenous Given 05/21/21 0515)  levETIRAcetam (KEPPRA) IVPB 500 mg/100 mL premix (has no administration in time range)  amLODipine (NORVASC) tablet 5 mg (5 mg Oral Given 05/20/21 2214)  clonazePAM (KLONOPIN) tablet 1 mg (1 mg Oral Not Given 05/20/21 2215)  QUEtiapine (SEROQUEL) tablet 300 mg (300 mg Oral Given 05/20/21 2216)  traZODone (DESYREL) tablet 100 mg (100 mg Oral Given 05/20/21 2216)  LORazepam (ATIVAN) tablet 0.5 mg (0.5 mg Oral Given 05/20/21 2216)  acetaminophen  (TYLENOL) tablet 650 mg (has no administration in time range)    Or  acetaminophen (TYLENOL) suppository 650 mg (has no administration in time range)  dexamethasone (DECADRON) injection 10 mg (10 mg Intravenous Given 05/20/21 1442)  levETIRAcetam (KEPPRA) IVPB 1000 mg/100 mL premix (0 mg Intravenous Stopped 05/20/21 1503)    And  levETIRAcetam (KEPPRA) IVPB 1000 mg/100 mL premix (0 mg Intravenous Stopped 05/20/21 1501)  iohexol (OMNIPAQUE) 300 MG/ML solution 100 mL (100 mLs Intravenous Contrast Given 05/20/21 1427)  gadobutrol (GADAVIST) 1 MMOL/ML injection 10 mL (10 mLs Intravenous Contrast Given 05/21/21 0656)      Note:  This document was prepared using Dragon voice recognition software and may include unintentional dictation errors.  Nanda Quinton, MD, [facility] Emergency [facility]    Kaneisha Ellenberger, [facility], MD 05/21/21 [phone]

## 2021-05-20 NOTE — ED Notes (Signed)
Patient transported to CT 

## 2021-05-20 NOTE — Plan of Care (Addendum)
ON CALL PHONE NOTE  Called by Dr Laverta [place] at [facility]. Patient presenting with 2 weeks of symptoms of misspelling words, headache and dizziness which was very apparent today when he was in a meeting and was asked to come get checked out.  CT head revealing of multiple areas of vasogenic edema concerning for metastatic process.  Impression: Likely brain metastases  Recs: -Oncological work-up-CT chest abdomen pelvis to look for a primary, MRI of the brain with and without contrast when he is at [facility], oncology and neurosurgical consultation. -Decadron 10 mg IV x1 followed by Decadron 4 mg every 6 hours.  I would also recommend putting him on Keppra 500 twice daily after giving him a 2000 mg load of Keppra x1.  I am not sure if an inpatient neurological consultation is necessary-he needs oncology and neurosurgery consultations as inpatient and should follow up with neuro-oncology as outpatient if needed.  Please call inpatient neurology service as needed.  -- Amie [place], MD Neurologist [facility] Pager: [phone]

## 2021-05-21 ENCOUNTER — Inpatient Hospital Stay (HOSPITAL_COMMUNITY): Payer: Managed Care, Other (non HMO)

## 2021-05-21 ENCOUNTER — Encounter (HOSPITAL_COMMUNITY): Payer: Self-pay | Admitting: Internal Medicine

## 2021-05-21 ENCOUNTER — Other Ambulatory Visit: Payer: Self-pay | Admitting: Radiation Therapy

## 2021-05-21 DIAGNOSIS — R519 Headache, unspecified: Secondary | ICD-10-CM | POA: Diagnosis not present

## 2021-05-21 DIAGNOSIS — E669 Obesity, unspecified: Secondary | ICD-10-CM | POA: Diagnosis present

## 2021-05-21 DIAGNOSIS — I10 Essential (primary) hypertension: Secondary | ICD-10-CM | POA: Diagnosis present

## 2021-05-21 DIAGNOSIS — R918 Other nonspecific abnormal finding of lung field: Secondary | ICD-10-CM

## 2021-05-21 DIAGNOSIS — C7931 Secondary malignant neoplasm of brain: Secondary | ICD-10-CM

## 2021-05-21 DIAGNOSIS — Z87891 Personal history of nicotine dependence: Secondary | ICD-10-CM | POA: Diagnosis not present

## 2021-05-21 DIAGNOSIS — Z6841 Body Mass Index (BMI) 40.0 and over, adult: Secondary | ICD-10-CM | POA: Diagnosis not present

## 2021-05-21 DIAGNOSIS — C3411 Malignant neoplasm of upper lobe, right bronchus or lung: Secondary | ICD-10-CM | POA: Diagnosis present

## 2021-05-21 DIAGNOSIS — Z20822 Contact with and (suspected) exposure to covid-19: Secondary | ICD-10-CM | POA: Diagnosis present

## 2021-05-21 DIAGNOSIS — Z880 Allergy status to penicillin: Secondary | ICD-10-CM | POA: Diagnosis not present

## 2021-05-21 DIAGNOSIS — F319 Bipolar disorder, unspecified: Secondary | ICD-10-CM | POA: Diagnosis not present

## 2021-05-21 DIAGNOSIS — R040 Epistaxis: Secondary | ICD-10-CM | POA: Diagnosis not present

## 2021-05-21 DIAGNOSIS — E871 Hypo-osmolality and hyponatremia: Secondary | ICD-10-CM

## 2021-05-21 DIAGNOSIS — F411 Generalized anxiety disorder: Secondary | ICD-10-CM | POA: Diagnosis present

## 2021-05-21 DIAGNOSIS — F419 Anxiety disorder, unspecified: Secondary | ICD-10-CM | POA: Diagnosis not present

## 2021-05-21 DIAGNOSIS — Z79899 Other long term (current) drug therapy: Secondary | ICD-10-CM | POA: Diagnosis not present

## 2021-05-21 DIAGNOSIS — G936 Cerebral edema: Secondary | ICD-10-CM | POA: Diagnosis present

## 2021-05-21 DIAGNOSIS — G4730 Sleep apnea, unspecified: Secondary | ICD-10-CM | POA: Diagnosis present

## 2021-05-21 LAB — CBC
HCT: 45.4 % (ref 39.0–52.0)
Hemoglobin: 15.7 g/dL (ref 13.0–17.0)
MCH: 29.9 pg (ref 26.0–34.0)
MCHC: 34.6 g/dL (ref 30.0–36.0)
MCV: 86.5 fL (ref 80.0–100.0)
Platelets: 280 10*3/uL (ref 150–400)
RBC: 5.25 MIL/uL (ref 4.22–5.81)
RDW: 14.3 % (ref 11.5–15.5)
WBC: 11.3 10*3/uL — ABNORMAL HIGH (ref 4.0–10.5)
nRBC: 0 % (ref 0.0–0.2)

## 2021-05-21 LAB — CREATININE, URINE, RANDOM: Creatinine, Urine: 225.47 mg/dL

## 2021-05-21 LAB — RAPID URINE DRUG SCREEN, HOSP PERFORMED
Amphetamines: NOT DETECTED
Barbiturates: NOT DETECTED
Benzodiazepines: NOT DETECTED
Cocaine: NOT DETECTED
Opiates: NOT DETECTED
Tetrahydrocannabinol: NOT DETECTED

## 2021-05-21 LAB — COMPREHENSIVE METABOLIC PANEL
ALT: 26 U/L (ref 0–44)
AST: 25 U/L (ref 15–41)
Albumin: 4 g/dL (ref 3.5–5.0)
Alkaline Phosphatase: 76 U/L (ref 38–126)
Anion gap: 11 (ref 5–15)
BUN: 14 mg/dL (ref 6–20)
CO2: 23 mmol/L (ref 22–32)
Calcium: 9.4 mg/dL (ref 8.9–10.3)
Chloride: 101 mmol/L (ref 98–111)
Creatinine, Ser: 1.1 mg/dL (ref 0.61–1.24)
GFR, Estimated: >60 mL/min (ref >60)
Glucose, Bld: 122 mg/dL — ABNORMAL HIGH (ref 70–99)
Potassium: 4.2 mmol/L (ref 3.5–5.1)
Sodium: 135 mmol/L (ref 135–145)
Total Bilirubin: 0.4 mg/dL (ref 0.3–1.2)
Total Protein: 8.3 g/dL — ABNORMAL HIGH (ref 6.5–8.1)

## 2021-05-21 LAB — OSMOLALITY, URINE: Osmolality, Ur: 822 mOsm/kg (ref 300–900)

## 2021-05-21 LAB — HIV ANTIBODY (ROUTINE TESTING W REFLEX): HIV Screen 4th Generation wRfx: NONREACTIVE

## 2021-05-21 LAB — URINALYSIS, ROUTINE W REFLEX MICROSCOPIC
Bilirubin Urine: NEGATIVE
Glucose, UA: NEGATIVE mg/dL
Hgb urine dipstick: NEGATIVE
Ketones, ur: NEGATIVE mg/dL
Leukocytes,Ua: NEGATIVE
Nitrite: NEGATIVE
Protein, ur: 30 mg/dL — AB
Specific Gravity, Urine: 1.034 — ABNORMAL HIGH (ref 1.005–1.030)
pH: 5 (ref 5.0–8.0)

## 2021-05-21 LAB — OSMOLALITY: Osmolality: 285 mOsm/kg (ref 275–295)

## 2021-05-21 LAB — SODIUM, URINE, RANDOM: Sodium, Ur: 36 mmol/L

## 2021-05-21 LAB — MAGNESIUM: Magnesium: 2.3 mg/dL (ref 1.7–2.4)

## 2021-05-21 MED ORDER — HYDROCODONE-ACETAMINOPHEN 5-325 MG PO TABS
1.0000 | ORAL_TABLET | ORAL | Status: DC | PRN
  Administered 2021-05-21: 1 via ORAL
  Filled 2021-05-21: qty 1

## 2021-05-21 MED ORDER — CLONAZEPAM 0.5 MG PO TABS
2.0000 mg | ORAL_TABLET | Freq: Two times a day (BID) | ORAL | Status: DC
  Administered 2021-05-21 – 2021-05-23 (×4): 2 mg via ORAL
  Filled 2021-05-21 (×5): qty 4

## 2021-05-21 MED ORDER — CLONAZEPAM 0.5 MG PO TABS: 2.0000 mg | ORAL_TABLET | Freq: Two times a day (BID) | ORAL | Status: DC

## 2021-05-21 MED ORDER — LEVETIRACETAM 500 MG PO TABS
500.0000 mg | ORAL_TABLET | Freq: Two times a day (BID) | ORAL | Status: DC
  Administered 2021-05-21 – 2021-05-23 (×5): 500 mg via ORAL
  Filled 2021-05-21 (×5): qty 1

## 2021-05-21 MED ORDER — VENLAFAXINE HCL ER 75 MG PO CP24
225.0000 mg | ORAL_CAPSULE | Freq: Every evening | ORAL | Status: DC
  Administered 2021-05-21 – 2021-05-22 (×2): 225 mg via ORAL
  Filled 2021-05-21 (×4): qty 1

## 2021-05-21 MED ORDER — ACETAMINOPHEN 650 MG RE SUPP: 650.0000 mg | Freq: Four times a day (QID) | RECTAL | Status: DC | PRN

## 2021-05-21 MED ORDER — GADOBUTROL 1 MMOL/ML IV SOLN
10.0000 mL | Freq: Once | INTRAVENOUS | Status: AC | PRN
  Administered 2021-05-21: 10 mL via INTRAVENOUS

## 2021-05-21 MED ORDER — ACETAMINOPHEN 325 MG PO TABS: 650.0000 mg | ORAL_TABLET | Freq: Four times a day (QID) | ORAL | Status: DC | PRN

## 2021-05-21 MED ORDER — LORAZEPAM 1 MG PO TABS: 1.0000 mg | ORAL_TABLET | Freq: Four times a day (QID) | ORAL | Status: DC | PRN

## 2021-05-21 MED ORDER — DEXAMETHASONE 4 MG PO TABS
4.0000 mg | ORAL_TABLET | Freq: Four times a day (QID) | ORAL | Status: DC
  Administered 2021-05-21 – 2021-05-23 (×7): 4 mg via ORAL
  Filled 2021-05-21 (×10): qty 1

## 2021-05-21 NOTE — Consult Note (Signed)
NAME:  YUEPHENG SCHALLER, MRN:  [id number], DOB:  [birth year], LOS: 0 ADMISSION DATE:  05/20/2021, CONSULTATION DATE: 05/21/2021 REFERRING MD: Dr. Olevia Bowens, CHIEF COMPLAINT: Lung mass  History of Present Illness:  This is a 55 year old gentleman, past medical history of hypertension presented to the emergency department in [facility] with headache.  CT imaging concerning for brain metastasis.  CT of the chest revealed large upper lobe mass on the right side.  Pulmonary was consulted for discussion regarding tissue biopsy.  Case has been reviewed by neurosurgery as well as neuro oncology.  He is a former smoker.  Pertinent  Medical History   Past Medical History:  Diagnosis Date   Anxiety    Bipolar 1 disorder ([place])    Hypertension    Mood disorder ([place])    Sleep apnea      Significant Hospital Events: Including procedures, antibiotic start and stop dates in addition to other pertinent events     Interim History / Subjective:  Per HPI above.  Patient very anxious about recent diagnosis.  Objective   Blood pressure (!) 149/97, pulse 87, temperature 98.5 F (36.9 C), resp. rate 18, height _0  (1.778 m), weight 134.3 kg, SpO2 95 %.        Intake/Output Summary (Last 24 hours) at 05/21/2021 1010 Last data filed at 05/21/2021 0900 Gross per 24 hour  Intake 92.89 ml  Output 1100 ml  Net -1007.11 ml   Filed Weights   05/20/21 1205  Weight: 134.3 kg    Examination: General: This is a obese middle-aged gentleman tearful sitting up at bed. HENT: Large neck, NCAT tracking appropriately Lungs: Clear to auscultation bilaterally no crackles no wheeze Cardiovascular: regular rate rhythm S1-S2 Abdomen: Obese soft nontender nondistended Extremities: No significant edema Neuro: Alert oriented following commands GU: Deferred  Resolved Hospital Problem list     Assessment & Plan:   55 year old male Right upper lobe lung mass, small amount of mediastinal adenopathy, Brain metastasis  concerning for an advanced stage bronchogenic carcinoma.  Plan: Met with patient this morning discussed risk benefits and alternatives of proceeding with bronchoscopy. Patient N.p.o. at midnight. Not on any blood thinners or antiplatelets We discussed risk of bleeding and pneumothorax. Patient is agreeable to proceed. Once tissue diagnosis is confirmed we will continue discussion with multidisciplinary team to include options for potential stereotactic radiosurgery for the brain lesions as well as chemo and radiation needs.  We appreciate the consultation. Please call with any questions.   Labs   CBC: Recent Labs  Lab 05/20/21 1324 05/21/21 0457  WBC 9.7 11.3*  NEUTROABS 7.2  --   HGB 14.5 15.7  HCT 42.1 45.4  MCV 86.8 86.5  PLT 261 993    Basic Metabolic Panel: Recent Labs  Lab 05/20/21 1324 05/21/21 0457  NA 133* 135  K 3.4* 4.2  CL 102 101  CO2 24 23  GLUCOSE 91 122*  BUN 16 14  CREATININE 1.14 1.10  CALCIUM 8.8* 9.4  MG  --  2.3   GFR: Estimated Creatinine Clearance: 105.9 mL/min (by C-G formula based on SCr of 1.1 mg/dL). Recent Labs  Lab 05/20/21 1324 05/21/21 0457  WBC 9.7 11.3*    Liver Function Tests: Recent Labs  Lab 05/20/21 1324 05/21/21 0457  AST 22 25  ALT 24 26  ALKPHOS 72 76  BILITOT 0.3 0.4  PROT 7.8 8.3*  ALBUMIN 4.1 4.0   Recent Labs  Lab 05/20/21 1324  LIPASE 31   No results  for input(s): AMMONIA in the last 168 hours.  ABG No results found for: PHART, PCO2ART, PO2ART, HCO3, TCO2, ACIDBASEDEF, O2SAT   Coagulation Profile: No results for input(s): INR, PROTIME in the last 168 hours.  Cardiac Enzymes: No results for input(s): CKTOTAL, CKMB, CKMBINDEX, TROPONINI in the last 168 hours.  HbA1C: No results found for: HGBA1C  CBG: No results for input(s): GLUCAP in the last 168 hours.  Review of Systems:   Review of Systems  Constitutional:  Negative for chills, fever, malaise/fatigue and weight loss.  HENT:   Negative for hearing loss, sore throat and tinnitus.   Eyes:  Negative for blurred vision and double vision.  Respiratory:  Negative for cough, hemoptysis, sputum production, shortness of breath, wheezing and stridor.   Cardiovascular:  Negative for chest pain, palpitations, orthopnea, leg swelling and PND.  Gastrointestinal:  Negative for abdominal pain, constipation, diarrhea, heartburn, nausea and vomiting.  Genitourinary:  Negative for dysuria, hematuria and urgency.  Musculoskeletal:  Negative for joint pain and myalgias.  Skin:  Negative for itching and rash.  Neurological:  Positive for dizziness and headaches. Negative for tingling and weakness.  Endo/Heme/Allergies:  Negative for environmental allergies. Does not bruise/bleed easily.  Psychiatric/Behavioral:  Negative for depression. The patient is not nervous/anxious and does not have insomnia.   All other systems reviewed and are negative.   Past Medical History:  He,  has a past medical history of Anxiety, Bipolar 1 disorder ([place]), Hypertension, Mood disorder ([place]), and Sleep apnea.   Surgical History:  History reviewed. No pertinent surgical history.   Social History:   reports that he has quit smoking. His smoking use included cigarettes. He has never used smokeless tobacco. He reports that he does not drink alcohol and does not use drugs.   Family History:  His family history is not on file.   Allergies Allergies  Allergen Reactions   Penicillins      Home Medications  Prior to Admission medications   Medication Sig Start Date End Date Taking? Authorizing Provider  amLODipine (NORVASC) 5 MG tablet Take 5 mg by mouth daily.   Yes [provider]  clonazePAM (KLONOPIN) 1 MG tablet Take 1 mg by mouth 2 (two) times daily.   Yes [provider]  QUEtiapine (SEROQUEL) 300 MG tablet Take 1 tablet (300 mg total) by mouth at bedtime. 03/18/16  Yes Focht, Jessica L, PA  tolterodine (DETROL) 2 MG tablet Take 4  mg by mouth 2 (two) times daily.   Yes [provider]  traZODone (DESYREL) 100 MG tablet Take 100 mg by mouth at bedtime.   Yes [provider]  venlafaxine XR (EFFEXOR XR) 75 MG 24 hr capsule Take 3 capsules (225 mg total) by mouth every evening. 03/18/16  Yes Focht, Fraser Din, PA  linaclotide (LINZESS) 145 MCG CAPS capsule Take 145 mcg by mouth daily before breakfast.    [provider]     Garner Nash, DO [facility] 05/21/2021 10:11 AM

## 2021-05-21 NOTE — Progress Notes (Signed)
05/21/2021 2:34 PM  Paged MD Olevia Bowens to inform him that patient has been off the floor. Per previous conversations that staff have had with patient, he went home to shower and plans to return.   New orders received.   Attending RN updated.   Kennedy Bohanon MSN, RN-BC, CNML [facility] Phone: [phone]

## 2021-05-21 NOTE — H&P (Signed)
History and Physical    PLEASE NOTE THAT DRAGON DICTATION SOFTWARE WAS USED IN THE CONSTRUCTION OF THIS NOTE.   Kevin Hunter YKD:[id number] DOB: 1965/11/21 DOA: 05/20/2021  PCP: Corine Shelter, PA-C Patient coming from: home   I have personally briefly reviewed patient's old medical records in [place]  Chief Complaint: Headaches  HPI: Kevin Hunter is a 55 y.o. male with medical history significant for hypertension who is admitted to [facility] on 05/20/2021 by way of transfer from [place] emergency department with suspected brain metastasis after presenting from home to the latter facility complaining of headaches.  The patient reports 1 month of intermittent, but progressive right-sided headaches associated with the right frontotemporal region.  Denies any associated visual or olfactory aura, nor any associated scintillating scotoma.  Denies any associated photophobia, nausea, vomiting.  Outside of the last month, he denies any history of regular or recurrent headaches.   Additionally, over the last 2 weeks, he reports potential weakness in his left lower extremity relative to his right lower extremity, denying any unilateral weakness in the upper extremities.  Denies any associated numbness, paresthesia, change in vision, blurry vision, diplopia, facial droop, dysarthria, dysphagia, or vertigo.  Over the last 2 weeks, he has noted however difficulty in spelling which she states is new for him.   Denies any associated subjective fever, chills, rigors, or generalized myalgias.  No recent neck stiffness, sore throat, shortness of breath, wheezing, abdominal pain, diarrhea, or rash.  No recent known COVID-19 exposures.  Not associate with any recent dysuria, gross materia, or change in urinary urgency/frequency.  He also denies any recent chest pain, palpitations, diaphoresis.   Denies any known history of underlying malignancy.  Confirms that he is a former  smoker.   While the patient is noted difficulty with spelling over the last 2 weeks, he conveys that his work colleagues first noted this issue earlier today, prompting him to present to [place] emergency department for further evaluation thereof.     [facility] ED Course:  Vital signs in the ED were notable for the following:  Temperature max 98.3, heart rate 72-83; blood pressure 117/83-156/95; respiratory rate 17-20, oxygen saturation 93 to 95% on room air.  Labs were notable for the following: CMP notable for the following: Sodium 133, without prior serum sodium data point available for point comparison, chloride 102, bicarbonate 24, creatinine 1.14, glucose 91, liver enzymes were found to be within normal limits.  TSH within normal limits.  CBC notable for white blood cell count 9700.  Screening COVID-19/influenza PCR were checked in the emergency department earlier today and found to be negative.  Imaging and additional notable ED work-up: Noncontrast CT of the head showed discrete parenchymal lesions in the right frontal and left parietal lobes with surrounding edema without midline shift, and concerning for metastatic disease.  CT chest/abdomen/pelvis with contrast showed mass of the right lung apex measuring 6.8 x 4.2 cm, favoring primary lung malignancy as well as showed mildly enlarged right hilar and mediastinal lymph nodes concerning for metastatic disease.  Case was discussed with the on-call neurologist, Dr. Malen Gauze, who recommended admission to the hospitalist service at [facility] for further evaluation and management of suspected new diagnosis of brain metastasis, including pursuing MRI brain.  Additionally, Dr. Malen Gauze recommended initiation of Decadron as well as Keppra.  Specifically, he recommended Keppra 2 g IV x1 dose followed by Keppra 500 mg IV twice  daily.  Additionally, he recommended Decadron 10 mg IV x1 followed by Decadron 4 mg IV every 6  hours.  While in the ED, the following were administered: Decadron 10 mg IV x1, Keppra 2 g IV x1, Ativan 0.5 mg p.o. x1 dose for anxiety.  Additionally, he following home medications were resumed: Norvasc, clonazepam, quetiapine.     Review of Systems: As per HPI otherwise 10 point review of systems negative.   Past Medical History:  Diagnosis Date   Anxiety    Bipolar 1 disorder ([place])    Hypertension    Mood disorder ([place])    Sleep apnea     History reviewed. No pertinent surgical history.  Social History:  reports that he has quit smoking. His smoking use included cigarettes. He has never used smokeless tobacco. He reports that he does not drink alcohol and does not use drugs.   Allergies  Allergen Reactions   Penicillins     Family history reviewed and not pertinent    Prior to Admission medications   Medication Sig Start Date End Date Taking? Authorizing Provider  amLODipine (NORVASC) 5 MG tablet Take 5 mg by mouth daily.   Yes [provider]  clonazePAM (KLONOPIN) 1 MG tablet Take 1 mg by mouth 2 (two) times daily.   Yes [provider]  QUEtiapine (SEROQUEL) 300 MG tablet Take 1 tablet (300 mg total) by mouth at bedtime. 03/18/16  Yes Focht, Jessica L, PA  tolterodine (DETROL) 2 MG tablet Take 4 mg by mouth 2 (two) times daily.   Yes [provider]  traZODone (DESYREL) 100 MG tablet Take 100 mg by mouth at bedtime.   Yes [provider]  venlafaxine XR (EFFEXOR XR) 75 MG 24 hr capsule Take 3 capsules (225 mg total) by mouth every evening. 03/18/16  Yes Focht, Fraser Din, PA  linaclotide (LINZESS) 145 MCG CAPS capsule Take 145 mcg by mouth daily before breakfast.    [provider]     Objective    Physical Exam: Vitals:   05/20/21 2300 05/21/21 0100 05/21/21 0251 05/21/21 0354  BP: 131/85 133/63 127/88 (!) 139/94  Pulse: 79 94 89 78  Resp:   18 18  Temp:   97.7 F (36.5 C) (!) 97.5 F (36.4 C)  TempSrc:   Oral    SpO2: 90% 90% 93% 90%  Weight:      Height:        General: appears to be stated age; alert, oriented Skin: warm, dry, no rash Head:  AT/[place] Mouth:  Oral mucosa membranes appear moist, normal dentition Neck: supple; trachea midline Heart:  RRR; did not appreciate any M/R/G Lungs: CTAB, did not appreciate any wheezes, rales, or rhonchi Abdomen: + BS; soft, ND, NT Vascular: 2+ pedal pulses b/l; 2+ radial pulses b/l Extremities: no peripheral edema, no muscle wasting Neuro: strength and sensation intact in upper and lower extremities b/l    Labs on Admission: I have personally reviewed following labs and imaging studies  CBC: Recent Labs  Lab 05/20/21 1324  WBC 9.7  NEUTROABS 7.2  HGB 14.5  HCT 42.1  MCV 86.8  PLT 035   Basic Metabolic Panel: Recent Labs  Lab 05/20/21 1324  NA 133*  K 3.4*  CL 102  CO2 24  GLUCOSE 91  BUN 16  CREATININE 1.14  CALCIUM 8.8*   GFR: Estimated Creatinine Clearance: 102.2 mL/min (by C-G formula based on SCr of 1.14 mg/dL). Liver Function Tests: Recent Labs  Lab  05/20/21 1324  AST 22  ALT 24  ALKPHOS 72  BILITOT 0.3  PROT 7.8  ALBUMIN 4.1   Recent Labs  Lab 05/20/21 1324  LIPASE 31   No results for input(s): AMMONIA in the last 168 hours. Coagulation Profile: No results for input(s): INR, PROTIME in the last 168 hours. Cardiac Enzymes: No results for input(s): CKTOTAL, CKMB, CKMBINDEX, TROPONINI in the last 168 hours. BNP (last 3 results) No results for input(s): PROBNP in the last 8760 hours. HbA1C: No results for input(s): HGBA1C in the last 72 hours. CBG: No results for input(s): GLUCAP in the last 168 hours. Lipid Profile: No results for input(s): CHOL, HDL, LDLCALC, TRIG, CHOLHDL, LDLDIRECT in the last 72 hours. Thyroid Function Tests: Recent Labs    05/20/21 1324  TSH 1.802   Anemia Panel: No results for input(s): VITAMINB12, FOLATE, FERRITIN, TIBC, IRON, RETICCTPCT in the last 72 hours. Urine  analysis: No results found for: COLORURINE, APPEARANCEUR, LABSPEC, PHURINE, GLUCOSEU, HGBUR, BILIRUBINUR, KETONESUR, PROTEINUR, UROBILINOGEN, NITRITE, LEUKOCYTESUR  Radiological Exams on Admission: CT HEAD WO CONTRAST (5MM)  Result Date: 05/20/2021 CLINICAL DATA:  Three weeks of headache, dizziness, trouble writing and typing EXAM: CT HEAD WITHOUT CONTRAST TECHNIQUE: Contiguous axial images were obtained from the base of the skull through the vertex without intravenous contrast. COMPARISON:  None. FINDINGS: Brain: There is a 1.5 cm centrally hypodense lesion in the right frontal lobe with significant surrounding vasogenic edema with sparing of the overlying white matter. There is a similar appearing lesion in the left parietal lobe measuring 1.2 cm with surrounding edema. There is mild vasogenic edema in the left inferior frontal gyrus (4-60). There is no evidence of acute intracranial hemorrhage or infarct. There is no extra-axial fluid collection Mass effect from the above described edema slightly effaces the body of the right lateral ventricle. There is no midline shift. Vascular: No hyperdense vessel or unexpected calcification. Skull: Normal. Negative for fracture or focal lesion. Sinuses/Orbits: The imaged paranasal sinuses are clear. The globes and orbits are unremarkable. Other: None. IMPRESSION: Discrete parenchymal lesions in the right frontal and left parietal lobes with marked surrounding vasogenic edema described above most suspicious for metastases. Mild vasogenic edema in the left inferior frontal gyrus raises suspicion for an additional lesion in this location. Brain MRI with and without contrast is recommended for further characterization. Electronically Signed   By: Valetta Mole M.D.   On: 05/20/2021 13:49   CT CHEST ABDOMEN PELVIS W CONTRAST  Result Date: 05/20/2021 CLINICAL DATA:  Evaluate for metastatic disease EXAM: CT CHEST, ABDOMEN, AND PELVIS WITH CONTRAST TECHNIQUE: Multidetector  CT imaging of the chest, abdomen and pelvis was performed following the standard protocol during bolus administration of intravenous contrast. CONTRAST:  17mL OMNIPAQUE IOHEXOL 300 MG/ML  SOLN COMPARISON:  None. FINDINGS: CT CHEST FINDINGS Cardiovascular: Normal heart size. No pericardial effusion. No significant coronary artery calcifications. No significant atherosclerotic disease of the thoracic aorta. Mediastinum/Nodes: Esophagus and thyroid are unremarkable. Enlarged right hilar lymph node measuring 1.3 cm in short axis on series 2, image 27. Enlarged right lower paratracheal lymph node measuring 1.4 cm short axis on series 2, image 23. Lungs/Pleura: Central airways are patent upper lobe predominant centrilobular and paraseptal emphysema. Mass of the right lung apex measuring 6.8 x 4.2 cm which abuts the lateral chest wall. Musculoskeletal: No chest wall mass or suspicious bone lesions identified. CT ABDOMEN PELVIS FINDINGS Hepatobiliary: No focal liver abnormality is seen. No gallstones, gallbladder wall thickening, or biliary dilatation. Pancreas: Unremarkable.  No pancreatic ductal dilatation or surrounding inflammatory changes. Spleen: Normal in size without focal abnormality. Adrenals/Urinary Tract: Indeterminate right adrenal nodule measuring 2.7 cm. Kidneys enhance symmetrically with no evidence of hydronephrosis or nephrolithiasis. Bladder is unremarkable. Stomach/Bowel: Stomach is within normal limits. Appendix appears normal. No evidence of bowel wall thickening, distention, or inflammatory changes. Vascular/Lymphatic: Aortic atherosclerosis. No enlarged abdominal or pelvic lymph nodes. Reproductive: Prostate is unremarkable. Other: No abdominal wall hernia or abnormality. No abdominopelvic ascites. Musculoskeletal: T8-T9 endplate lucencies, likely due to Schmorl's nodes. No aggressive appearing osseous lesions. IMPRESSION: Mass of the right lung apex measuring 6.8 x 4.2 cm which abuts the lateral  chest wall, favor primary lung malignancy. Mildly enlarged right hilar and mediastinal lymph nodes, concerning for metastatic disease. Indeterminate right adrenal nodule, differential includes metastatic disease versus a lipid poor adenoma. Aortic Atherosclerosis (ICD10-I70.0) and Emphysema (ICD10-J43.9). Electronically Signed   By: Yetta Glassman M.D.   On: 05/20/2021 15:29      Assessment/Plan   Kevin Hunter is a 55 y.o. male with medical history significant for hypertension who is admitted to [facility] on 05/20/2021 by way of transfer from [place] emergency department with suspected brain metastasis after presenting from home to the latter facility complaining of headaches.   Principal Problem:   Brain metastasis ([place]) Active Problems:   Hyponatremia   Bipolar 1 disorder ([facility])   Anxiety   Hypertension     #) Brain metastasis: Per CT head performed earlier today, new diagnosis of suspected brain metastasis, with CT head showing discrete parenchymal lesions in the right frontal and left parietal lobes with surrounding cerebral edema concerning for metastasis, with ensuing CT chest/abdomen/pelvis showing evidence of a mass in the right lung apex concerning for primary lung malignancy.  CT head not associated with any evidence of midline shift, and neuro exam reveals no evidence of acute focal neurologic deficits.  Cushing's triad for increased intracranial pressure currently negative, in the absence of any evidence of bradycardia, significant hypertension, or irregular respirations.    Case was discussed with the on-call neurologist, Dr. Malen Gauze, who recommended admission to the hospitalist service at [facility] for further evaluation and management of suspected new diagnosis of brain metastasis, including pursuing MRI brain, which is currently pending.  Additionally, Dr. Malen Gauze recommended initiation of both Decadron as well as Keppra, with these specific  recommendations, including associated loading doses, initiated per his recommendations.  Neurology to follow.    Plan: MRI brain with and without contrast, per neurology recommendations.  Neurology consulted.  Every 4 hours neurochecks.  IV Decadron and Keppra, per neurology recommendations, as further quantified above.  Repeat CMP and CBC in the morning. suspect onc consult and potential neuro-onc consult pending result of MRI brain.  Hold home Effexor due to associated diminished seizure threshold.       #) Acute hypo-osmolar euvolemic hyponatremia: Mild hyponatremia with presenting serum sodium of 133, without prior data point available for point comparison.  Clinically, patient appears euvolemic, and presentation is concerning for SIADH in the setting of suspicion for brain metastasis as well as potential primary lung cancer, as further outlined above.  Of note, TSH found to be within normal limits.  No recent trauma.  No evidence of objective acute focal neurologic deficits, as further detailed above.  Potential mild pharmacologic contribution is also possible, including presence of Effexor on home medications.   Plan: monitor strict I's and O's and daily weights.  check UA, random urine sodium,  urine osmolality.  Check serum osmolality to confirm suspected hypoosmolar etiology.  Repeat BMP in the morning.  Further evaluation management of suspected brain metastasis as well as right lung apex mass, as further described above.  Holding home Effexor, as further detailed above.       #) Bipolar disorder, type I, documented history of such, on Seroquel as an outpatient.  Plan: Continue Seroquel.      #) Generalized anxiety disorder: Documented history of such, on Effexor as well as scheduled clonazepam as an outpatient.  Plan: In the setting of suspected brain metastasis with associated cerebral edema, will hold home Effexor for now, as further detailed above.  Continue home scheduled  clonazepam.     #) Essential hypertension: Documented history of such, on amlodipine as an outpatient.  Systolic blood blood pressures have been normotensive to mildly hypertensive, with maximum blood pressures noted to be in the systolic 625W.  Will closely monitor ensuing blood pressure, as hypertension can serve as a indicator for worsening intracranial pressure.  Plan: Resume home amlodipine.  Close monitoring with single pressures via routine vital signs.     DVT prophylaxis: SCDs Code Status: Full code Family Communication: none Disposition Plan: Per Rounding Team Consults called: case discussed with Dr. Malen Gauze of neuro, as further detailed above;a Admission status: inpatient; med-tele   Of note, this patient was added by me to the following Admit List/Treatment Team:  mcadmits.   Of note, the Adult Admission Order Set (Multimorbid order set) was used by me in the admission process for this patient.  PLEASE NOTE THAT DRAGON DICTATION SOFTWARE WAS USED IN THE CONSTRUCTION OF THIS NOTE.   Rhetta Mura DO [facility] Pager [phone] From [place]   05/21/2021, 4:14 AM

## 2021-05-21 NOTE — Progress Notes (Signed)
Called patient cell phone to inform have medications to give him and patient said was waiting for someone to get back, they had to go get keys,  asked if would be back up to room in 30 minutes so could give him medications.    Floor had received a call from patient 30 minutes prior saying they could not find his car and spoke to [place].

## 2021-05-21 NOTE — TOC Initial Note (Signed)
Transition of Care [facility]) - Initial/Assessment Note    Patient Details  Name: Kevin Hunter MRN: [id number] Date of Birth: 11-07-1965  Transition of Care [facility]) CM/SW Contact:    Tom-Johnson, Renea Ee, RN Phone Number: 05/21/2021, 4:02 PM  Clinical Narrative:                 CM spoke with patient at bedside. Has PMH of Headaches, Bipolar 1 disorder, HTN, Mood disorder, Sleep Apnea. Presented to the [place] ED with severe headaches and transferred to [facility]. CT head showed Parenchymal lesion in rt frontal and left parietal lobes with surrounding edema. CT abdomen, chest and pelvis showed mass in rt lung apex. Started on IV Keppra and Decadron. Patient states he lives alone, wife deceased from renal cancer three years ago. Has four children who lives with their mothers. Patient's Mother is still living. States he is employed by [employer] and he was at work when his coworkers noticed he was documenting wrong on the computer and advised him to go to the ED. States he is able to drive self and independent with care prior to hospitalization. Does not have DME's. Has a PCP and Medically insured. Patient became emotional and crying during conversations. CM consoled him and asked him if he would like to speak with someone and he declined at this time.  1420- CM at the nurses desk speaking with nurse and noticed patient not in his room. Nurse stated patient left the unit. CM had seen patient and his brother walked down the hall earlier, thinking they were going to the cafeteria or snack machine. While talking with nurse, patient called nurse on the phone asking if his wallet and keys were in his room. CM went in patient's room and noted keys and wallet with a packet in a patient belongings bag on the chair. Nurse made aware. CM will continue to follow with needs.  Expected Discharge Plan: Home/Self Care Barriers to Discharge: Continued Medical Work  up   Patient Goals and CMS Choice Patient states their goals for this hospitalization and ongoing recovery are:: To go home CMS Medicare.gov Compare Post Acute Care list provided to:: Patient    Expected Discharge Plan and Services Expected Discharge Plan: Home/Self Care   Discharge Planning Services: CM Consult   Living arrangements for the past 2 months: Apartment                                      Prior Living Arrangements/Services Living arrangements for the past 2 months: Apartment Lives with:: Self Patient language and need for interpreter reviewed:: Yes Do you feel safe going back to the place where you live?: Yes      Need for Family Participation in Patient Care: Yes (Comment) Care giver support system in place?: Yes (comment)   Criminal Activity/Legal Involvement Pertinent to Current Situation/Hospitalization: No - Comment as needed  Activities of Daily Living Home Assistive Devices/Equipment: None ADL Screening (condition at time of admission) Patient's cognitive ability adequate to safely complete daily activities?: Yes Is the patient deaf or have difficulty hearing?: No Does the patient have difficulty seeing, even when wearing glasses/contacts?: No Does the patient have difficulty concentrating, remembering, or making decisions?: No Patient able to express need for assistance with ADLs?: Yes Does the patient have difficulty dressing or bathing?: No Independently performs ADLs?: Yes (appropriate for developmental age)  Does the patient have difficulty walking or climbing stairs?: No Weakness of Legs: None Weakness of Arms/Hands: None  Permission Sought/Granted Permission sought to share information with : Case Manager, Family Supports Permission granted to share information with : Yes, Verbal Permission Granted              Emotional Assessment Appearance:: Appears stated age Attitude/Demeanor/Rapport: Engaged Affect (typically observed):  Accepting, Appropriate, Hopeful Orientation: : Oriented to Self, Oriented to Place, Oriented to  Time, Oriented to Situation Alcohol / Substance Use: Other (comment) (Vaps)    Admission diagnosis:  Brain tumor ([place]) [D49.6] Metastasis ([place]) [C79.9] Patient Active Problem List   Diagnosis Date Noted   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

## 2021-05-21 NOTE — ED Notes (Signed)
Report given to [facility] on 75M

## 2021-05-21 NOTE — ED Notes (Signed)
[facility] here to transport pt 

## 2021-05-21 NOTE — ED Notes (Signed)
Report given to [facility]. 

## 2021-05-21 NOTE — Progress Notes (Signed)
Leadership was notified pt has left the floor and believed he was down at the ER attempting to locate his belongings. After pt being gone a while leadership called PT to inquire on his location. PT stated that he had gone home to take a shower and get his belongings and would be returning to the hospital within the next hour. Leadership asked pt if he had notified staff that he would be going home to shower and he stated "yes". Leadership spoke to pt's RN about the situation ; which she reported pt had not told her he was going home. Will continue to search for pt.

## 2021-05-21 NOTE — ED Notes (Signed)
Pt was using urinal and dropped on floor; floor cleaned and pt gown, socks and mesh underwear changed

## 2021-05-21 NOTE — Anesthesia Preprocedure Evaluation (Addendum)
Anesthesia Evaluation  Patient identified by MRN, date of birth, ID band Patient awake    Reviewed: Allergy & Precautions, NPO status , Patient's Chart, lab work & pertinent test results  History of Anesthesia Complications Negative for: history of anesthetic complications  Airway Mallampati: II  TM Distance: >3 FB Neck ROM: Full    Dental  (+)    Pulmonary sleep apnea , former smoker,  Pulmonary nodules   Pulmonary exam normal        Cardiovascular hypertension, Normal cardiovascular exam     Neuro/Psych Anxiety Bipolar Disorder Metastatic lesions in right frontal and left parietal lobes with edema, no midline shift    GI/Hepatic negative GI ROS, Neg liver ROS,   Endo/Other  negative endocrine ROS  Renal/GU negative Renal ROS  negative genitourinary   Musculoskeletal negative musculoskeletal ROS (+)   Abdominal   Peds  Hematology negative hematology ROS (+)   Anesthesia Other Findings Day of surgery medications reviewed with patient.  Reproductive/Obstetrics negative OB ROS                            Anesthesia Physical Anesthesia Plan  ASA: 3  Anesthesia Plan: General   Post-op Pain Management:    Induction: Intravenous  PONV Risk Score and Plan: 2 and Treatment may vary due to age or medical condition, Ondansetron, Dexamethasone and Midazolam  Airway Management Planned: Oral ETT  Additional Equipment: None  Intra-op Plan:   Post-operative Plan: Extubation in OR  Informed Consent: I have reviewed the patients History and Physical, chart, labs and discussed the procedure including the risks, benefits and alternatives for the proposed anesthesia with the patient or authorized representative who has indicated his/her understanding and acceptance.     Dental advisory given  Plan Discussed with: CRNA  Anesthesia Plan Comments:        Anesthesia Quick Evaluation

## 2021-05-21 NOTE — Plan of Care (Signed)
  Problem: Activity: Goal: Risk for activity intolerance will decrease Outcome: Progressing   Problem: Elimination: Goal: Will not experience complications related to urinary retention Outcome: Progressing   

## 2021-05-21 NOTE — Progress Notes (Signed)
Let provider Icard know that patient did not remember details of the risks of the procedure tomorrow so did not sign consent.  Provider will have sign tomorrow prior to and go over details again.  Per provider not an issue.

## 2021-05-21 NOTE — H&P (View-Only) (Signed)
NAME:  Kevin Hunter, MRN:  [id number], DOB:  [birth year], LOS: 0 ADMISSION DATE:  05/20/2021, CONSULTATION DATE: 05/21/2021 REFERRING MD: Dr. Olevia Bowens, CHIEF COMPLAINT: Lung mass  History of Present Illness:  This is a 55 year old gentleman, past medical history of hypertension presented to the emergency department in [facility] with headache.  CT imaging concerning for brain metastasis.  CT of the chest revealed large upper lobe mass on the right side.  Pulmonary was consulted for discussion regarding tissue biopsy.  Case has been reviewed by neurosurgery as well as neuro oncology.  He is a former smoker.  Pertinent  Medical History   Past Medical History:  Diagnosis Date   Anxiety    Bipolar 1 disorder ([place])    Hypertension    Mood disorder ([place])    Sleep apnea      Significant Hospital Events: Including procedures, antibiotic start and stop dates in addition to other pertinent events     Interim History / Subjective:  Per HPI above.  Patient very anxious about recent diagnosis.  Objective   Blood pressure (!) 149/97, pulse 87, temperature 98.5 F (36.9 C), resp. rate 18, height _0  (1.778 m), weight 134.3 kg, SpO2 95 %.        Intake/Output Summary (Last 24 hours) at 05/21/2021 1010 Last data filed at 05/21/2021 0900 Gross per 24 hour  Intake 92.89 ml  Output 1100 ml  Net -1007.11 ml   Filed Weights   05/20/21 1205  Weight: 134.3 kg    Examination: General: This is a obese middle-aged gentleman tearful sitting up at bed. HENT: Large neck, NCAT tracking appropriately Lungs: Clear to auscultation bilaterally no crackles no wheeze Cardiovascular: regular rate rhythm S1-S2 Abdomen: Obese soft nontender nondistended Extremities: No significant edema Neuro: Alert oriented following commands GU: Deferred  Resolved Hospital Problem list     Assessment & Plan:   55 year old male Right upper lobe lung mass, small amount of mediastinal adenopathy, Brain metastasis  concerning for an advanced stage bronchogenic carcinoma.  Plan: Met with patient this morning discussed risk benefits and alternatives of proceeding with bronchoscopy. Patient N.p.o. at midnight. Not on any blood thinners or antiplatelets We discussed risk of bleeding and pneumothorax. Patient is agreeable to proceed. Once tissue diagnosis is confirmed we will continue discussion with multidisciplinary team to include options for potential stereotactic radiosurgery for the brain lesions as well as chemo and radiation needs.  We appreciate the consultation. Please call with any questions.   Labs   CBC: Recent Labs  Lab 05/20/21 1324 05/21/21 0457  WBC 9.7 11.3*  NEUTROABS 7.2  --   HGB 14.5 15.7  HCT 42.1 45.4  MCV 86.8 86.5  PLT 261 993    Basic Metabolic Panel: Recent Labs  Lab 05/20/21 1324 05/21/21 0457  NA 133* 135  K 3.4* 4.2  CL 102 101  CO2 24 23  GLUCOSE 91 122*  BUN 16 14  CREATININE 1.14 1.10  CALCIUM 8.8* 9.4  MG  --  2.3   GFR: Estimated Creatinine Clearance: 105.9 mL/min (by C-G formula based on SCr of 1.1 mg/dL). Recent Labs  Lab 05/20/21 1324 05/21/21 0457  WBC 9.7 11.3*    Liver Function Tests: Recent Labs  Lab 05/20/21 1324 05/21/21 0457  AST 22 25  ALT 24 26  ALKPHOS 72 76  BILITOT 0.3 0.4  PROT 7.8 8.3*  ALBUMIN 4.1 4.0   Recent Labs  Lab 05/20/21 1324  LIPASE 31   No results  for input(s): AMMONIA in the last 168 hours.  ABG No results found for: PHART, PCO2ART, PO2ART, HCO3, TCO2, ACIDBASEDEF, O2SAT   Coagulation Profile: No results for input(s): INR, PROTIME in the last 168 hours.  Cardiac Enzymes: No results for input(s): CKTOTAL, CKMB, CKMBINDEX, TROPONINI in the last 168 hours.  HbA1C: No results found for: HGBA1C  CBG: No results for input(s): GLUCAP in the last 168 hours.  Review of Systems:   Review of Systems  Constitutional:  Negative for chills, fever, malaise/fatigue and weight loss.  HENT:   Negative for hearing loss, sore throat and tinnitus.   Eyes:  Negative for blurred vision and double vision.  Respiratory:  Negative for cough, hemoptysis, sputum production, shortness of breath, wheezing and stridor.   Cardiovascular:  Negative for chest pain, palpitations, orthopnea, leg swelling and PND.  Gastrointestinal:  Negative for abdominal pain, constipation, diarrhea, heartburn, nausea and vomiting.  Genitourinary:  Negative for dysuria, hematuria and urgency.  Musculoskeletal:  Negative for joint pain and myalgias.  Skin:  Negative for itching and rash.  Neurological:  Positive for dizziness and headaches. Negative for tingling and weakness.  Endo/Heme/Allergies:  Negative for environmental allergies. Does not bruise/bleed easily.  Psychiatric/Behavioral:  Negative for depression. The patient is not nervous/anxious and does not have insomnia.   All other systems reviewed and are negative.   Past Medical History:  He,  has a past medical history of Anxiety, Bipolar 1 disorder ([place]), Hypertension, Mood disorder ([place]), and Sleep apnea.   Surgical History:  History reviewed. No pertinent surgical history.   Social History:   reports that he has quit smoking. His smoking use included cigarettes. He has never used smokeless tobacco. He reports that he does not drink alcohol and does not use drugs.   Family History:  His family history is not on file.   Allergies Allergies  Allergen Reactions   Penicillins      Home Medications  Prior to Admission medications   Medication Sig Start Date End Date Taking? Authorizing Provider  amLODipine (NORVASC) 5 MG tablet Take 5 mg by mouth daily.   Yes [provider]  clonazePAM (KLONOPIN) 1 MG tablet Take 1 mg by mouth 2 (two) times daily.   Yes [provider]  QUEtiapine (SEROQUEL) 300 MG tablet Take 1 tablet (300 mg total) by mouth at bedtime. 03/18/16  Yes Focht, Jessica L, PA  tolterodine (DETROL) 2 MG tablet Take 4  mg by mouth 2 (two) times daily.   Yes [provider]  traZODone (DESYREL) 100 MG tablet Take 100 mg by mouth at bedtime.   Yes [provider]  venlafaxine XR (EFFEXOR XR) 75 MG 24 hr capsule Take 3 capsules (225 mg total) by mouth every evening. 03/18/16  Yes Focht, Fraser Din, PA  linaclotide (LINZESS) 145 MCG CAPS capsule Take 145 mcg by mouth daily before breakfast.    [provider]     Garner Nash, DO [facility] 05/21/2021 10:11 AM

## 2021-05-21 NOTE — TOC Progression Note (Signed)
Transition of Care [facility]) - Progression Note    Patient Details  Name: Kevin Hunter MRN: [id number] Date of Birth: 1965/12/06  Transition of Care [facility]) CM/SW Contact  Tom-Johnson, Renea Ee, RN Phone Number: 05/21/2021, 5:03 PM  Clinical Narrative:    CM spoke with patient in his room. Stated he went home to take a shower. Patient still emotional and crying. CM consoled him and patient declined any further assistance. CM will continue to follow.   Expected Discharge Plan: Home/Self Care Barriers to Discharge: Continued Medical Work up  Expected Discharge Plan and Services Expected Discharge Plan: Home/Self Care   Discharge Planning Services: CM Consult   Living arrangements for the past 2 months: Apartment                                       Social Determinants of Health (SDOH) Interventions    Readmission Risk Interventions No flowsheet data found.

## 2021-05-21 NOTE — Progress Notes (Signed)
05/21/2021 3:16 PM  Called and spoke to patient. Per patient, "I am trying to find the hospital now. I am on [facility]. Near [facility]." Directions provided.   Will call back in 20 minutes.   Beniah Magnan MSN, RN-BC, CNML [facility] Phone: [phone]

## 2021-05-21 NOTE — Progress Notes (Signed)
[facility] PROGRESS NOTE    Progress Note  SOLAN VOSLER  YSA:[id number] DOB: July 20, 1966 DOA: 05/20/2021 PCP: Corine Shelter, PA-C     Brief Narrative:   TADEN WITTER is an 55 y.o. male past medical history significant for hypertension who came in with headache, noncontrast CT of the head showed a parenchymal lesion in the right frontal and left parietal lobe with surrounding edema but no midline shift, CT scan of the abdomen chest and pelvis with contrast showing mass in the right lung apex measuring 6 x 4 cm and mildly enlarged right hilar lymphadenopathy, the case was discussed with neurology was started on IV Keppra and Decadron    Assessment/Plan:   Brain metastasis ([place]): CT of the head showed suspected brain metastases, CT scan of the chest abdomen and pelvis showed right lung apex mass measuring 6 x 4 cm. MRI of the brain is pending.  Agree with IV Decadron has been loaded with Keppra and will continue Keppra twice a day. Will need to consult pulmonary to see if robotic assisted biopsy can be done.  Acute hypoosmolar euvolemic hyponatremia: Likely hypovolemic resolved with IV fluid hydration.  Bipolar disorder type I: Continue Seroquel no changes made.  General anxiety disorder: Continue Effexor and clonazepam or change made to his medication.  Essential hypertension: Resume Norvasc.   DVT prophylaxis: lovenox Family Communication:none Status is: Inpatient  Remains inpatient appropriate because:Hemodynamically unstable  Dispo: The patient is from: Home              Anticipated d/c is to: Home              Patient currently is not medically stable to d/c.   Difficult to place patient No    Code Status:     Code Status Orders  (From admission, onward)           Start     Ordered   05/21/21 0414  Full code  Continuous        05/21/21 0414           Code Status History     This patient has a current code status but no historical code  status.         IV Access:   Peripheral IV   Procedures and diagnostic studies:   CT HEAD WO CONTRAST (5MM)  Result Date: 05/20/2021 CLINICAL DATA:  Three weeks of headache, dizziness, trouble writing and typing EXAM: CT HEAD WITHOUT CONTRAST TECHNIQUE: Contiguous axial images were obtained from the base of the skull through the vertex without intravenous contrast. COMPARISON:  None. FINDINGS: Brain: There is a 1.5 cm centrally hypodense lesion in the right frontal lobe with significant surrounding vasogenic edema with sparing of the overlying white matter. There is a similar appearing lesion in the left parietal lobe measuring 1.2 cm with surrounding edema. There is mild vasogenic edema in the left inferior frontal gyrus (4-60). There is no evidence of acute intracranial hemorrhage or infarct. There is no extra-axial fluid collection Mass effect from the above described edema slightly effaces the body of the right lateral ventricle. There is no midline shift. Vascular: No hyperdense vessel or unexpected calcification. Skull: Normal. Negative for fracture or focal lesion. Sinuses/Orbits: The imaged paranasal sinuses are clear. The globes and orbits are unremarkable. Other: None. IMPRESSION: Discrete parenchymal lesions in the right frontal and left parietal lobes with marked surrounding vasogenic edema described above most suspicious for metastases. Mild vasogenic edema in the left inferior frontal  gyrus raises suspicion for an additional lesion in this location. Brain MRI with and without contrast is recommended for further characterization. Electronically Signed   By: Valetta Mole M.D.   On: 05/20/2021 13:49   CT CHEST ABDOMEN PELVIS W CONTRAST  Result Date: 05/20/2021 CLINICAL DATA:  Evaluate for metastatic disease EXAM: CT CHEST, ABDOMEN, AND PELVIS WITH CONTRAST TECHNIQUE: Multidetector CT imaging of the chest, abdomen and pelvis was performed following the standard protocol during bolus  administration of intravenous contrast. CONTRAST:  133mL OMNIPAQUE IOHEXOL 300 MG/ML  SOLN COMPARISON:  None. FINDINGS: CT CHEST FINDINGS Cardiovascular: Normal heart size. No pericardial effusion. No significant coronary artery calcifications. No significant atherosclerotic disease of the thoracic aorta. Mediastinum/Nodes: Esophagus and thyroid are unremarkable. Enlarged right hilar lymph node measuring 1.3 cm in short axis on series 2, image 27. Enlarged right lower paratracheal lymph node measuring 1.4 cm short axis on series 2, image 23. Lungs/Pleura: Central airways are patent upper lobe predominant centrilobular and paraseptal emphysema. Mass of the right lung apex measuring 6.8 x 4.2 cm which abuts the lateral chest wall. Musculoskeletal: No chest wall mass or suspicious bone lesions identified. CT ABDOMEN PELVIS FINDINGS Hepatobiliary: No focal liver abnormality is seen. No gallstones, gallbladder wall thickening, or biliary dilatation. Pancreas: Unremarkable. No pancreatic ductal dilatation or surrounding inflammatory changes. Spleen: Normal in size without focal abnormality. Adrenals/Urinary Tract: Indeterminate right adrenal nodule measuring 2.7 cm. Kidneys enhance symmetrically with no evidence of hydronephrosis or nephrolithiasis. Bladder is unremarkable. Stomach/Bowel: Stomach is within normal limits. Appendix appears normal. No evidence of bowel wall thickening, distention, or inflammatory changes. Vascular/Lymphatic: Aortic atherosclerosis. No enlarged abdominal or pelvic lymph nodes. Reproductive: Prostate is unremarkable. Other: No abdominal wall hernia or abnormality. No abdominopelvic ascites. Musculoskeletal: T8-T9 endplate lucencies, likely due to Schmorl's nodes. No aggressive appearing osseous lesions. IMPRESSION: Mass of the right lung apex measuring 6.8 x 4.2 cm which abuts the lateral chest wall, favor primary lung malignancy. Mildly enlarged right hilar and mediastinal lymph nodes,  concerning for metastatic disease. Indeterminate right adrenal nodule, differential includes metastatic disease versus a lipid poor adenoma. Aortic Atherosclerosis (ICD10-I70.0) and Emphysema (ICD10-J43.9). Electronically Signed   By: Yetta Glassman M.D.   On: 05/20/2021 15:29     Medical Consultants:   None.   Subjective:    [name] he relates his symptoms are improved.  Objective:    Vitals:   05/20/21 2300 05/21/21 0100 05/21/21 0251 05/21/21 0354  BP: 131/85 133/63 127/88 (!) 139/94  Pulse: 79 94 89 78  Resp:   18 18  Temp:   97.7 F (36.5 C) (!) 97.5 F (36.4 C)  TempSrc:   Oral   SpO2: 90% 90% 93% 90%  Weight:      Height:       SpO2: 90 %   Intake/Output Summary (Last 24 hours) at 05/21/2021 0810 Last data filed at 05/21/2021 0131 Gross per 24 hour  Intake 92.89 ml  Output 800 ml  Net -707.11 ml   Filed Weights   05/20/21 1205  Weight: 134.3 kg    Exam: General exam: In no acute distress. Respiratory system: Good air movement and clear to auscultation. Cardiovascular system: S1 & S2 heard, RRR. No JVD. Gastrointestinal system: Abdomen is nondistended, soft and nontender.  Extremities: No pedal edema. Skin: No rashes, lesions or ulcers Psychiatry: Judgement and insight appear normal. Mood & affect appropriate.    Data Reviewed:    Labs: Basic Metabolic Panel: Recent Labs  Lab 05/20/21  1324 05/21/21 0457  NA 133* 135  K 3.4* 4.2  CL 102 101  CO2 24 23  GLUCOSE 91 122*  BUN 16 14  CREATININE 1.14 1.10  CALCIUM 8.8* 9.4  MG  --  2.3   GFR Estimated Creatinine Clearance: 105.9 mL/min (by C-G formula based on SCr of 1.1 mg/dL). Liver Function Tests: Recent Labs  Lab 05/20/21 1324 05/21/21 0457  AST 22 25  ALT 24 26  ALKPHOS 72 76  BILITOT 0.3 0.4  PROT 7.8 8.3*  ALBUMIN 4.1 4.0   Recent Labs  Lab 05/20/21 1324  LIPASE 31   No results for input(s): AMMONIA in the last 168 hours. Coagulation profile No results for  input(s): INR, PROTIME in the last 168 hours. COVID-19 Labs  No results for input(s): DDIMER, FERRITIN, LDH, CRP in the last 72 hours.  Lab Results  Component Value Date   [place] NEGATIVE 05/20/2021    CBC: Recent Labs  Lab 05/20/21 1324 05/21/21 0457  WBC 9.7 11.3*  NEUTROABS 7.2  --   HGB 14.5 15.7  HCT 42.1 45.4  MCV 86.8 86.5  PLT 261 280   Cardiac Enzymes: No results for input(s): CKTOTAL, CKMB, CKMBINDEX, TROPONINI in the last 168 hours. BNP (last 3 results) No results for input(s): PROBNP in the last 8760 hours. CBG: No results for input(s): GLUCAP in the last 168 hours. D-Dimer: No results for input(s): DDIMER in the last 72 hours. Hgb A1c: No results for input(s): HGBA1C in the last 72 hours. Lipid Profile: No results for input(s): CHOL, HDL, LDLCALC, TRIG, CHOLHDL, LDLDIRECT in the last 72 hours. Thyroid function studies: Recent Labs    05/20/21 1324  TSH 1.802   Anemia work up: No results for input(s): VITAMINB12, FOLATE, FERRITIN, TIBC, IRON, RETICCTPCT in the last 72 hours. Sepsis Labs: Recent Labs  Lab 05/20/21 1324 05/21/21 0457  WBC 9.7 11.3*   Microbiology Recent Results (from the past 240 hour(s))  Resp Panel by RT-PCR (Flu A&B, Covid) Nasopharyngeal Swab     Status: None   Collection Time: 05/20/21  2:53 PM   Specimen: Nasopharyngeal Swab; Nasopharyngeal(NP) swabs in vial transport medium  Result Value Ref Range Status   SARS Coronavirus 2 by RT PCR NEGATIVE NEGATIVE Final    Comment: (NOTE) SARS-CoV-2 target nucleic acids are NOT DETECTED.  The SARS-CoV-2 RNA is generally detectable in upper respiratory specimens during the acute phase of infection. The lowest concentration of SARS-CoV-2 viral copies this assay can detect is 138 copies/mL. A negative result does not preclude SARS-Cov-2 infection and should not be used as the sole basis for treatment or other patient management decisions. A negative result may occur with   improper specimen collection/handling, submission of specimen other than nasopharyngeal swab, presence of viral mutation(s) within the areas targeted by this assay, and inadequate number of viral copies(<138 copies/mL). A negative result must be combined with clinical observations, patient history, and epidemiological information. The expected result is Negative.  Fact Sheet for Patients:  EntrepreneurPulse.com.au  Fact Sheet for Healthcare Providers:  IncredibleEmployment.be  This test is no t yet approved or cleared by the [place] FDA and  has been authorized for detection and/or diagnosis of SARS-CoV-2 by FDA under an Emergency Use Authorization (EUA). This EUA will remain  in effect (meaning this test can be used) for the duration of the COVID-19 declaration under Section 564(b)(1) of the Act, 21 U.S.C.section 360bbb-3(b)(1), unless the authorization is terminated  or revoked sooner.  Influenza A by PCR NEGATIVE NEGATIVE Final   Influenza B by PCR NEGATIVE NEGATIVE Final    Comment: (NOTE) The Xpert Xpress SARS-CoV-2/FLU/RSV plus assay is intended as an aid in the diagnosis of influenza from Nasopharyngeal swab specimens and should not be used as a sole basis for treatment. Nasal washings and aspirates are unacceptable for Xpert Xpress SARS-CoV-2/FLU/RSV testing.  Fact Sheet for Patients: EntrepreneurPulse.com.au  Fact Sheet for Healthcare Providers: IncredibleEmployment.be  This test is not yet approved or cleared by the [place] FDA and has been authorized for detection and/or diagnosis of SARS-CoV-2 by FDA under an Emergency Use Authorization (EUA). This EUA will remain in effect (meaning this test can be used) for the duration of the COVID-19 declaration under Section 564(b)(1) of the Act, 21 U.S.C. section 360bbb-3(b)(1), unless the authorization is terminated  or revoked.  Performed at [facility], [address] [place], [place] [address]      Medications:    amLODipine  5 mg Oral Daily   clonazePAM  1 mg Oral BID   dexamethasone (DECADRON) injection  4 mg Intravenous Q6H   QUEtiapine  300 mg Oral QHS   traZODone  100 mg Oral QHS   Continuous Infusions:  levETIRAcetam        LOS: 0 days   Charlynne Cousins  [facility]  05/21/2021, 8:10 AM

## 2021-05-22 ENCOUNTER — Inpatient Hospital Stay (HOSPITAL_COMMUNITY): Payer: Managed Care, Other (non HMO) | Admitting: Anesthesiology

## 2021-05-22 ENCOUNTER — Encounter: Payer: Self-pay | Admitting: *Deleted

## 2021-05-22 ENCOUNTER — Encounter (HOSPITAL_COMMUNITY)
Admission: EM | Disposition: A | Discharge status: Home / Self Care | Payer: Self-pay | Source: Home / Self Care | Attending: Internal Medicine

## 2021-05-22 ENCOUNTER — Inpatient Hospital Stay (HOSPITAL_COMMUNITY): Payer: Managed Care, Other (non HMO)

## 2021-05-22 ENCOUNTER — Encounter (HOSPITAL_COMMUNITY): Payer: Self-pay | Admitting: Internal Medicine

## 2021-05-22 DIAGNOSIS — R918 Other nonspecific abnormal finding of lung field: Secondary | ICD-10-CM

## 2021-05-22 DIAGNOSIS — F319 Bipolar disorder, unspecified: Secondary | ICD-10-CM | POA: Diagnosis not present

## 2021-05-22 DIAGNOSIS — C7931 Secondary malignant neoplasm of brain: Secondary | ICD-10-CM | POA: Diagnosis not present

## 2021-05-22 DIAGNOSIS — R519 Headache, unspecified: Secondary | ICD-10-CM | POA: Diagnosis not present

## 2021-05-22 HISTORY — PX: BRONCHIAL BRUSHINGS: SHX5108

## 2021-05-22 HISTORY — PX: VIDEO BRONCHOSCOPY WITH RADIAL ENDOBRONCHIAL ULTRASOUND: SHX6849

## 2021-05-22 HISTORY — PX: BRONCHIAL BIOPSY: SHX5109

## 2021-05-22 HISTORY — PX: VIDEO BRONCHOSCOPY WITH ENDOBRONCHIAL NAVIGATION: SHX6175

## 2021-05-22 HISTORY — PX: BRONCHIAL NEEDLE ASPIRATION BIOPSY: SHX5106

## 2021-05-22 SURGERY — VIDEO BRONCHOSCOPY WITH ENDOBRONCHIAL NAVIGATION
Anesthesia: General

## 2021-05-22 MED ORDER — CHLORHEXIDINE GLUCONATE 0.12 % MT SOLN: 15.0000 mL | Freq: Once | OROMUCOSAL | Status: AC

## 2021-05-22 MED ORDER — OXYMETAZOLINE HCL 0.05 % NA SOLN
NASAL | Status: DC | PRN
  Administered 2021-05-22 (×4): 2 via NASAL

## 2021-05-22 MED ORDER — LACTATED RINGERS IV SOLN: INTRAVENOUS | Status: DC

## 2021-05-22 MED ORDER — OXYMETAZOLINE HCL 0.05 % NA SOLN
NASAL | Status: AC
  Filled 2021-05-22: qty 30

## 2021-05-22 MED ORDER — CHLORHEXIDINE GLUCONATE 0.12 % MT SOLN
OROMUCOSAL | Status: AC
  Administered 2021-05-22: 15 mL via OROMUCOSAL
  Filled 2021-05-22: qty 15

## 2021-05-22 MED ORDER — SUGAMMADEX SODIUM 200 MG/2ML IV SOLN
INTRAVENOUS | Status: DC | PRN
  Administered 2021-05-22: 400 mg via INTRAVENOUS

## 2021-05-22 MED ORDER — ROCURONIUM BROMIDE 10 MG/ML (PF) SYRINGE
PREFILLED_SYRINGE | INTRAVENOUS | Status: DC | PRN
  Administered 2021-05-22: 60 mg via INTRAVENOUS

## 2021-05-22 MED ORDER — LIDOCAINE 2% (20 MG/ML) 5 ML SYRINGE
INTRAMUSCULAR | Status: DC | PRN
  Administered 2021-05-22: 100 mg via INTRAVENOUS

## 2021-05-22 MED ORDER — ONDANSETRON HCL 4 MG/2ML IJ SOLN
INTRAMUSCULAR | Status: DC | PRN
  Administered 2021-05-22: 4 mg via INTRAVENOUS

## 2021-05-22 MED ORDER — MIDAZOLAM HCL 2 MG/2ML IJ SOLN
INTRAMUSCULAR | Status: DC | PRN
  Administered 2021-05-22: 2 mg via INTRAVENOUS

## 2021-05-22 MED ORDER — PROPOFOL 10 MG/ML IV BOLUS
INTRAVENOUS | Status: DC | PRN
  Administered 2021-05-22: 20 mg via INTRAVENOUS
  Administered 2021-05-22: 180 mg via INTRAVENOUS

## 2021-05-22 MED ORDER — FENTANYL CITRATE (PF) 100 MCG/2ML IJ SOLN: 25.0000 ug | INTRAMUSCULAR | Status: DC | PRN

## 2021-05-22 MED ORDER — FENTANYL CITRATE (PF) 250 MCG/5ML IJ SOLN
INTRAMUSCULAR | Status: DC | PRN
  Administered 2021-05-22: 100 ug via INTRAVENOUS

## 2021-05-22 MED ORDER — PROMETHAZINE HCL 25 MG/ML IJ SOLN: 6.2500 mg | INTRAMUSCULAR | Status: DC | PRN

## 2021-05-22 MED ORDER — DEXAMETHASONE SODIUM PHOSPHATE 10 MG/ML IJ SOLN
INTRAMUSCULAR | Status: DC | PRN
  Administered 2021-05-22: 10 mg via INTRAVENOUS

## 2021-05-22 SURGICAL SUPPLY — 45 items
ADAPTER BRONCH F/PENTAX (ADAPTER) ×3 IMPLANT
ADAPTER VALVE BIOPSY EBUS (MISCELLANEOUS) IMPLANT
ADPTR VALVE BIOPSY EBUS (MISCELLANEOUS)
BRUSH CYTOL CELLEBRITY 1.5X140 (MISCELLANEOUS) ×3 IMPLANT
BRUSH SUPERTRAX BIOPSY (INSTRUMENTS) IMPLANT
BRUSH SUPERTRAX NDL-TIP CYTO (INSTRUMENTS) ×3 IMPLANT
CANISTER SUCT 3000ML PPV (MISCELLANEOUS) ×3 IMPLANT
CHANNEL WORK EXTEND EDGE 180 (KITS) IMPLANT
CHANNEL WORK EXTEND EDGE 45 (KITS) IMPLANT
CHANNEL WORK EXTEND EDGE 90 (KITS) IMPLANT
CONT SPEC 4OZ CLIKSEAL STRL BL (MISCELLANEOUS) ×3 IMPLANT
COVER BACK TABLE 60X90IN (DRAPES) ×3 IMPLANT
FILTER STRAW FLUID ASPIR (MISCELLANEOUS) IMPLANT
FORCEPS BIOP SUPERTRX PREMAR (INSTRUMENTS) ×3 IMPLANT
GAUZE SPONGE 4X4 12PLY STRL (GAUZE/BANDAGES/DRESSINGS) ×3 IMPLANT
GLOVE SURG SS PI 7.5 STRL IVOR (GLOVE) ×6 IMPLANT
GOWN STRL REUS W/ TWL LRG LVL3 (GOWN DISPOSABLE) ×4 IMPLANT
GOWN STRL REUS W/TWL LRG LVL3 (GOWN DISPOSABLE) ×2
KIT CLEAN ENDO COMPLIANCE (KITS) ×3 IMPLANT
KIT LOCATABLE GUIDE (CANNULA) IMPLANT
KIT MARKER FIDUCIAL DELIVERY (KITS) IMPLANT
KIT PROCEDURE EDGE 180 (KITS) IMPLANT
KIT PROCEDURE EDGE 45 (KITS) IMPLANT
KIT PROCEDURE EDGE 90 (KITS) IMPLANT
KIT TURNOVER KIT B (KITS) ×3 IMPLANT
MARKER SKIN DUAL TIP RULER LAB (MISCELLANEOUS) ×3 IMPLANT
NDL SUPERTRX PREMARK BIOPSY (NEEDLE) ×2 IMPLANT
NEEDLE SUPERTRX PREMARK BIOPSY (NEEDLE) ×3 IMPLANT
NS IRRIG 1000ML POUR BTL (IV SOLUTION) ×3 IMPLANT
OIL SILICONE PENTAX (PARTS (SERVICE/REPAIRS)) ×3 IMPLANT
PAD ARMBOARD 7.5X6 YLW CONV (MISCELLANEOUS) ×6 IMPLANT
PATCHES PATIENT (LABEL) ×9 IMPLANT
SOL ANTI FOG 6CC (MISCELLANEOUS) ×2 IMPLANT
SOLUTION ANTI FOG 6CC (MISCELLANEOUS) ×1
SYR 20CC LL (SYRINGE) ×3 IMPLANT
SYR 20ML ECCENTRIC (SYRINGE) ×3 IMPLANT
SYR 50ML SLIP (SYRINGE) ×3 IMPLANT
TOWEL OR 17X24 6PK STRL BLUE (TOWEL DISPOSABLE) ×3 IMPLANT
TRAP SPECIMEN MUCOUS 40CC (MISCELLANEOUS) IMPLANT
TUBE CONNECTING 20X1/4 (TUBING) ×3 IMPLANT
UNDERPAD 30X30 (UNDERPADS AND DIAPERS) ×3 IMPLANT
VALVE BIOPSY  SINGLE USE (MISCELLANEOUS) ×1
VALVE BIOPSY SINGLE USE (MISCELLANEOUS) ×2 IMPLANT
VALVE SUCTION BRONCHIO DISP (MISCELLANEOUS) ×3 IMPLANT
WATER STERILE IRR 1000ML POUR (IV SOLUTION) ×3 IMPLANT

## 2021-05-22 NOTE — Transfer of Care (Signed)
Immediate Anesthesia Transfer of Care Note  Patient: Kevin Hunter  Procedure(s) Performed: VIDEO BRONCHOSCOPY WITH ENDOBRONCHIAL NAVIGATION BRONCHIAL BRUSHINGS BRONCHIAL NEEDLE ASPIRATION BIOPSIES BRONCHIAL BIOPSIES RADIAL ENDOBRONCHIAL ULTRASOUND  Patient Location: PACU  Anesthesia Type:General  Level of Consciousness: drowsy and patient cooperative  Airway & Oxygen Therapy: Patient Spontanous Breathing and Patient connected to face mask oxygen  Post-op Assessment: Report given to RN and Post -op Vital signs reviewed and stable  Post vital signs: Reviewed and stable  Last Vitals:  Vitals Value Taken Time  BP 155/96 05/22/21 1036  Temp    Pulse 92 05/22/21 1036  Resp 16 05/22/21 1036  SpO2 96 % 05/22/21 1036  Vitals shown include unvalidated device data.  Last Pain:  Vitals:   05/22/21 0816  TempSrc:   PainSc: 0-No pain         Complications: No notable events documented.

## 2021-05-22 NOTE — Op Note (Signed)
Video Bronchoscopy with Robotic Assisted Bronchoscopic Navigation   Date of Operation: 05/22/2021   Pre-op Diagnosis: Lung mass, right upper lobe  Post-op Diagnosis: Lung mass, right upper lobe  Surgeon: Garner Nash, DO   Assistants: None   Anesthesia: General endotracheal anesthesia  Operation: Flexible video fiberoptic bronchoscopy with robotic assistance and biopsies.  Estimated Blood Loss: Minimal  Complications: None  Indications and History: Kevin Hunter is a 55 y.o. male with history of brain metastasis, lung mass. . The risks, benefits, complications, treatment options and expected outcomes were discussed with the patient.  The possibilities of pneumothorax, pneumonia, reaction to medication, pulmonary aspiration, perforation of a viscus, bleeding, failure to diagnose a condition and creating a complication requiring transfusion or operation were discussed with the patient who freely signed the consent.    Description of Procedure: The patient was seen in the Preoperative Area, was examined and was deemed appropriate to proceed.  The patient was taken to [facility] endoscopy room 3, identified as Kevin Hunter and the procedure verified as Flexible Video Fiberoptic Bronchoscopy.  A Time Out was held and the above information confirmed.   Prior to the date of the procedure a high-resolution CT scan of the chest was performed. Utilizing ION software program a virtual tracheobronchial tree was generated to allow the creation of distinct navigation pathways to the patient's parenchymal abnormalities. After being taken to the operating room general anesthesia was initiated and the patient  was orally intubated. The video fiberoptic bronchoscope was introduced via the endotracheal tube and a general inspection was performed which showed normal right and left lung anatomy, aspiration of the bilateral mainstems was completed to remove any remaining secretions. Robotic catheter inserted  into patient's endotracheal tube.   Target #1 right upper lobe: The distinct navigation pathways prepared prior to this procedure were then utilized to navigate to patient's lesion identified on CT scan. The robotic catheter was secured into place and the vision probe was withdrawn.  Lesion location was approximated using fluoroscopy and radial endobronchial ultrasound for peripheral targeting. Under fluoroscopic guidance transbronchial needle brushings, transbronchial needle biopsies, and transbronchial forceps biopsies were performed to be sent for cytology and pathology.   At the end of the procedure a general airway inspection was performed and there was no evidence of active bleeding.  Bronchoscope was used for a therapeutic aspiration of the bilateral mainstem's removal of blood clots and debris.  All distal subsegments were patent at the termination procedure the bronchoscope was removed.  The patient tolerated the procedure well. There was no significant blood loss and there were no obvious complications. A post-procedural chest x-ray is pending.  Samples Target #1: 1. Transbronchial needle brushings from right upper lobe 2. Transbronchial Wang needle biopsies from right upper lobe 3. Transbronchial forceps biopsies from right upper lobe  Plans:  The patient will be discharged from the PACU to home when recovered from anesthesia and after chest x-ray is reviewed. We will review the cytology, pathology and microbiology results with the patient when they become available. Outpatient followup will be with Garner Nash, DO.   Garner Nash, DO [facility] 05/22/2021 10:20 AM

## 2021-05-22 NOTE — Interval H&P Note (Signed)
History and Physical Interval Note:  05/22/2021 9:01 AM  Kevin Hunter  has presented today for surgery, with the diagnosis of pulmonary nodule.  The various methods of treatment have been discussed with the patient and family. After consideration of risks, benefits and other options for treatment, the patient has consented to  Procedure(s): [place] (N/A) as a surgical intervention.  The patient's history has been reviewed, patient examined, no change in status, stable for surgery.  I have reviewed the patient's chart and labs.  Questions were answered to the patient's satisfaction.     [place]

## 2021-05-22 NOTE — Anesthesia Procedure Notes (Signed)
Procedure Name: Intubation Date/Time: 05/22/2021 9:26 AM Performed by: Lance Coon, CRNA Pre-anesthesia Checklist: Patient identified, Emergency Drugs available, Suction available, Patient being monitored and Timeout performed Patient Re-evaluated:Patient Re-evaluated prior to induction Oxygen Delivery Method: Circle system utilized Preoxygenation: Pre-oxygenation with 100% oxygen Induction Type: IV induction Ventilation: Mask ventilation without difficulty Laryngoscope Size: Miller and 3 Grade View: Grade II Tube type: Oral Tube size: 8.5 mm Number of attempts: 1 Airway Equipment and Method: Stylet Placement Confirmation: ETT inserted through vocal cords under direct vision and positive ETCO2 Secured at: 23 cm Tube secured with: Tape Dental Injury: Teeth and Oropharynx as per pre-operative assessment

## 2021-05-22 NOTE — Progress Notes (Signed)
Patient's brother:  Tanya Marvin  [phone]

## 2021-05-22 NOTE — Progress Notes (Signed)
[facility] PROGRESS NOTE    Progress Note  Kevin Hunter  HWE:[id number] DOB: 08-16-1966 DOA: 05/20/2021 PCP: Corine Shelter, PA-C     Brief Narrative:   Kevin Hunter is an 55 y.o. male past medical history significant for hypertension who came in with headache, noncontrast CT of the head showed a parenchymal lesion in the right frontal and left parietal lobe with surrounding edema but no midline shift, CT scan of the abdomen chest and pelvis with contrast showing mass in the right lung apex measuring 6 x 4 cm and mildly enlarged right hilar lymphadenopathy, the case was discussed with neurology was started on IV Keppra and Decadron    Assessment/Plan:   Left lower lobe mass/brain metastasis ([place]): Consulted pulmonary and he status post primary lung mass biopsy. Results will probably be back after 05/25/2021. We have already notified the oncologist we will follow-up on the biopsy results and outpatient, he will also follow-up with [name] neuro oncologist. Had mild epistaxis, now resolved.  Acute hypoosmolar euvolemic hyponatremia: Likely hypovolemic resolved with IV fluid hydration.  Bipolar disorder type I: Continue Seroquel no changes made.  General anxiety disorder: Continue Effexor and clonazepam or change made to his medication.  Essential hypertension: Resume Norvasc.   DVT prophylaxis: lovenox Family Communication:none Status is: Inpatient  Remains inpatient appropriate because:Hemodynamically unstable  Dispo: The patient is from: Home              Anticipated d/c is to: Home              Patient currently is not medically stable to d/c.   Difficult to place patient No    Code Status:     Code Status Orders  (From admission, onward)           Start     Ordered   05/21/21 0414  Full code  Continuous        05/21/21 0414           Code Status History     This patient has a current code status but no historical code status.          IV Access:   Peripheral IV   Procedures and diagnostic studies:   CT HEAD WO CONTRAST (5MM)  Result Date: 05/20/2021 CLINICAL DATA:  Three weeks of headache, dizziness, trouble writing and typing EXAM: CT HEAD WITHOUT CONTRAST TECHNIQUE: Contiguous axial images were obtained from the base of the skull through the vertex without intravenous contrast. COMPARISON:  None. FINDINGS: Brain: There is a 1.5 cm centrally hypodense lesion in the right frontal lobe with significant surrounding vasogenic edema with sparing of the overlying white matter. There is a similar appearing lesion in the left parietal lobe measuring 1.2 cm with surrounding edema. There is mild vasogenic edema in the left inferior frontal gyrus (4-60). There is no evidence of acute intracranial hemorrhage or infarct. There is no extra-axial fluid collection Mass effect from the above described edema slightly effaces the body of the right lateral ventricle. There is no midline shift. Vascular: No hyperdense vessel or unexpected calcification. Skull: Normal. Negative for fracture or focal lesion. Sinuses/Orbits: The imaged paranasal sinuses are clear. The globes and orbits are unremarkable. Other: None. IMPRESSION: Discrete parenchymal lesions in the right frontal and left parietal lobes with marked surrounding vasogenic edema described above most suspicious for metastases. Mild vasogenic edema in the left inferior frontal gyrus raises suspicion for an additional lesion in this location. Brain MRI with and  without contrast is recommended for further characterization. Electronically Signed   By: Valetta Mole M.D.   On: 05/20/2021 13:49   MR Brain W and Wo Contrast  Result Date: 05/21/2021 CLINICAL DATA:  55 year old male who presented with headache, dizziness and abnormal head CT revealing multifocal vasogenic looking cerebral edema. Subsequent CT Chest, Abdomen, and Pelvis demonstrates a large right upper lung mass. Staging. EXAM:  MRI HEAD WITHOUT AND WITH CONTRAST TECHNIQUE: Multiplanar, multiecho pulse sequences of the brain and surrounding structures were obtained without and with intravenous contrast. CONTRAST:  97mL GADAVIST GADOBUTROL 1 MMOL/ML IV SOLN COMPARISON:  Head CT without contrast 05/20/2021. FINDINGS: Brain: Nodular, irregular heterogeneously enhancing masses in the right superior frontal gyrus (21 mm series 20, image 48) and left posterior parietal lobe (20 mm image 38) correspond to the areas of prominent vasogenic edema seen by CT. Faint hemosiderin associated with the left parietal lesion. Edema is stable since yesterday. Mild regional mass effect, but no midline shift. Smaller 7-8 mm enhancing nodular lesion in the anterior left frontal lobe corresponds to a more subtle area vasogenic edema there (series 20, image 36). No other abnormal brain enhancement identified. No dural thickening. No superimposed restricted diffusion to suggest acute infarction. No midline shift, ventriculomegaly, extra-axial collection or acute intracranial hemorrhage. Cervicomedullary junction and pituitary are within normal limits. Outside of the areas of edema, gray and white matter signal is within normal limits. No cortical encephalomalacia or other chronic cerebral blood products. Vascular: Major intracranial vascular flow voids are preserved, the left vertebral artery appears dominant. Following contrast the major dural venous sinuses are enhancing and appear to be patent. Skull and upper cervical spine: Heterogeneous decreased T1 marrow signal in the skull and upper cervical spine. But no destructive osseous lesion identified. Negative visible cervical spinal cord. Sinuses/Orbits: Negative. Other: Mastoids are clear. Visible internal auditory structures appear normal. Negative visible + scalp and face. IMPRESSION: 1. Three enhancing brain metastases, ranging from 7-8 mm (anterior left frontal lobe) to 21 mm (right superior frontal gyrus).  Associated vasogenic edema is stable from the CT yesterday. No midline shift or impending herniation. 2. Nonspecific decreased T1 marrow signal in the skull and upper cervical spine, but no destructive osseous lesion to strongly indicate osseous metastases. Electronically Signed   By: Genevie Ann M.D.   On: 05/21/2021 08:32   CT CHEST ABDOMEN PELVIS W CONTRAST  Result Date: 05/20/2021 CLINICAL DATA:  Evaluate for metastatic disease EXAM: CT CHEST, ABDOMEN, AND PELVIS WITH CONTRAST TECHNIQUE: Multidetector CT imaging of the chest, abdomen and pelvis was performed following the standard protocol during bolus administration of intravenous contrast. CONTRAST:  140mL OMNIPAQUE IOHEXOL 300 MG/ML  SOLN COMPARISON:  None. FINDINGS: CT CHEST FINDINGS Cardiovascular: Normal heart size. No pericardial effusion. No significant coronary artery calcifications. No significant atherosclerotic disease of the thoracic aorta. Mediastinum/Nodes: Esophagus and thyroid are unremarkable. Enlarged right hilar lymph node measuring 1.3 cm in short axis on series 2, image 27. Enlarged right lower paratracheal lymph node measuring 1.4 cm short axis on series 2, image 23. Lungs/Pleura: Central airways are patent upper lobe predominant centrilobular and paraseptal emphysema. Mass of the right lung apex measuring 6.8 x 4.2 cm which abuts the lateral chest wall. Musculoskeletal: No chest wall mass or suspicious bone lesions identified. CT ABDOMEN PELVIS FINDINGS Hepatobiliary: No focal liver abnormality is seen. No gallstones, gallbladder wall thickening, or biliary dilatation. Pancreas: Unremarkable. No pancreatic ductal dilatation or surrounding inflammatory changes. Spleen: Normal in size without focal abnormality. Adrenals/Urinary  Tract: Indeterminate right adrenal nodule measuring 2.7 cm. Kidneys enhance symmetrically with no evidence of hydronephrosis or nephrolithiasis. Bladder is unremarkable. Stomach/Bowel: Stomach is within normal  limits. Appendix appears normal. No evidence of bowel wall thickening, distention, or inflammatory changes. Vascular/Lymphatic: Aortic atherosclerosis. No enlarged abdominal or pelvic lymph nodes. Reproductive: Prostate is unremarkable. Other: No abdominal wall hernia or abnormality. No abdominopelvic ascites. Musculoskeletal: T8-T9 endplate lucencies, likely due to Schmorl's nodes. No aggressive appearing osseous lesions. IMPRESSION: Mass of the right lung apex measuring 6.8 x 4.2 cm which abuts the lateral chest wall, favor primary lung malignancy. Mildly enlarged right hilar and mediastinal lymph nodes, concerning for metastatic disease. Indeterminate right adrenal nodule, differential includes metastatic disease versus a lipid poor adenoma. Aortic Atherosclerosis (ICD10-I70.0) and Emphysema (ICD10-J43.9). Electronically Signed   By: Yetta Glassman M.D.   On: 05/20/2021 15:29     Medical Consultants:   None.   Subjective:    Kevin Hunter relates he wants pizza  Objective:    Vitals:   05/21/21 1556 05/21/21 2121 05/22/21 0521 05/22/21 0803  BP: (!) 144/91 125/73 (!) 122/96 139/64  Pulse: 92 80 75 61  Resp: 16 18 18 17   Temp: (!) 97.4 F (36.3 C) 97.7 F (36.5 C) 97.6 F (36.4 C) 97.6 F (36.4 C)  TempSrc: Oral Oral Oral Oral  SpO2:  94% 95% 94%  Weight:  134.3 kg    Height:       SpO2: 94 %   Intake/Output Summary (Last 24 hours) at 05/22/2021 1011 Last data filed at 05/22/2021 0700 Gross per 24 hour  Intake 120 ml  Output 0 ml  Net 120 ml    Filed Weights   05/20/21 1205 05/21/21 2121  Weight: 134.3 kg 134.3 kg    Exam: General exam:  In no acute ditress Respiratory system: Good air movement and Clear to auscultation Cardiovascular system: RRR Gastrointestinal system:  + BM, soft and not tender  Extremities: no edema Skin: No rashes, lesions or ulcers Psychiatry: Judgement and insight is preserved.   Data Reviewed:    Labs: Basic Metabolic  Panel: Recent Labs  Lab 05/20/21 1324 05/21/21 0457  NA 133* 135  K 3.4* 4.2  CL 102 101  CO2 24 23  GLUCOSE 91 122*  BUN 16 14  CREATININE 1.14 1.10  CALCIUM 8.8* 9.4  MG  --  2.3    GFR Estimated Creatinine Clearance: 105.9 mL/min (by C-G formula based on SCr of 1.1 mg/dL). Liver Function Tests: Recent Labs  Lab 05/20/21 1324 05/21/21 0457  AST 22 25  ALT 24 26  ALKPHOS 72 76  BILITOT 0.3 0.4  PROT 7.8 8.3*  ALBUMIN 4.1 4.0    Recent Labs  Lab 05/20/21 1324  LIPASE 31    No results for input(s): AMMONIA in the last 168 hours. Coagulation profile No results for input(s): INR, PROTIME in the last 168 hours. COVID-19 Labs  No results for input(s): DDIMER, FERRITIN, LDH, CRP in the last 72 hours.  Lab Results  Component Value Date   [place] NEGATIVE 05/20/2021    CBC: Recent Labs  Lab 05/20/21 1324 05/21/21 0457  WBC 9.7 11.3*  NEUTROABS 7.2  --   HGB 14.5 15.7  HCT 42.1 45.4  MCV 86.8 86.5  PLT 261 280    Cardiac Enzymes: No results for input(s): CKTOTAL, CKMB, CKMBINDEX, TROPONINI in the last 168 hours. BNP (last 3 results) No results for input(s): PROBNP in the last 8760 hours. CBG: No results  for input(s): GLUCAP in the last 168 hours. D-Dimer: No results for input(s): DDIMER in the last 72 hours. Hgb A1c: No results for input(s): HGBA1C in the last 72 hours. Lipid Profile: No results for input(s): CHOL, HDL, LDLCALC, TRIG, CHOLHDL, LDLDIRECT in the last 72 hours. Thyroid function studies: Recent Labs    05/20/21 1324  TSH 1.802    Anemia work up: No results for input(s): VITAMINB12, FOLATE, FERRITIN, TIBC, IRON, RETICCTPCT in the last 72 hours. Sepsis Labs: Recent Labs  Lab 05/20/21 1324 05/21/21 0457  WBC 9.7 11.3*    Microbiology Recent Results (from the past 240 hour(s))  Resp Panel by RT-PCR (Flu A&B, Covid) Nasopharyngeal Swab     Status: None   Collection Time: 05/20/21  2:53 PM   Specimen: Nasopharyngeal  Swab; Nasopharyngeal(NP) swabs in vial transport medium  Result Value Ref Range Status   SARS Coronavirus 2 by RT PCR NEGATIVE NEGATIVE Final    Comment: (NOTE) SARS-CoV-2 target nucleic acids are NOT DETECTED.  The SARS-CoV-2 RNA is generally detectable in upper respiratory specimens during the acute phase of infection. The lowest concentration of SARS-CoV-2 viral copies this assay can detect is 138 copies/mL. A negative result does not preclude SARS-Cov-2 infection and should not be used as the sole basis for treatment or other patient management decisions. A negative result may occur with  improper specimen collection/handling, submission of specimen other than nasopharyngeal swab, presence of viral mutation(s) within the areas targeted by this assay, and inadequate number of viral copies(<138 copies/mL). A negative result must be combined with clinical observations, patient history, and epidemiological information. The expected result is Negative.  Fact Sheet for Patients:  EntrepreneurPulse.com.au  Fact Sheet for Healthcare Providers:  IncredibleEmployment.be  This test is no t yet approved or cleared by the [place] FDA and  has been authorized for detection and/or diagnosis of SARS-CoV-2 by FDA under an Emergency Use Authorization (EUA). This EUA will remain  in effect (meaning this test can be used) for the duration of the COVID-19 declaration under Section 564(b)(1) of the Act, 21 U.S.C.section 360bbb-3(b)(1), unless the authorization is terminated  or revoked sooner.       Influenza A by PCR NEGATIVE NEGATIVE Final   Influenza B by PCR NEGATIVE NEGATIVE Final    Comment: (NOTE) The Xpert Xpress SARS-CoV-2/FLU/RSV plus assay is intended as an aid in the diagnosis of influenza from Nasopharyngeal swab specimens and should not be used as a sole basis for treatment. Nasal washings and aspirates are unacceptable for Xpert Xpress  SARS-CoV-2/FLU/RSV testing.  Fact Sheet for Patients: EntrepreneurPulse.com.au  Fact Sheet for Healthcare Providers: IncredibleEmployment.be  This test is not yet approved or cleared by the [place] FDA and has been authorized for detection and/or diagnosis of SARS-CoV-2 by FDA under an Emergency Use Authorization (EUA). This EUA will remain in effect (meaning this test can be used) for the duration of the COVID-19 declaration under Section 564(b)(1) of the Act, 21 U.S.C. section 360bbb-3(b)(1), unless the authorization is terminated or revoked.  Performed at [facility], [address] [place], [place] [address]      Medications:    [MAR Hold] amLODipine  5 mg Oral Daily   [MAR Hold] clonazePAM  2 mg Oral BID   [MAR Hold] dexamethasone  4 mg Oral Q6H   [MAR Hold] levETIRAcetam  500 mg Oral BID   [MAR Hold] QUEtiapine  300 mg Oral QHS   [MAR Hold] traZODone  100 mg Oral  QHS   [MAR Hold] venlafaxine XR  225 mg Oral QPM   Continuous Infusions:  lactated ringers 10 mL/hr at 05/22/21 0818      LOS: 1 day   Charlynne Cousins  [facility]  05/22/2021, 10:11 AM

## 2021-05-22 NOTE — Anesthesia Postprocedure Evaluation (Signed)
Anesthesia Post Note  Patient: Kevin Hunter  Procedure(s) Performed: VIDEO BRONCHOSCOPY WITH ENDOBRONCHIAL NAVIGATION BRONCHIAL BRUSHINGS BRONCHIAL NEEDLE ASPIRATION BIOPSIES BRONCHIAL BIOPSIES RADIAL ENDOBRONCHIAL ULTRASOUND     Patient location during evaluation: PACU Anesthesia Type: General Level of consciousness: awake and alert and oriented Pain management: pain level controlled Vital Signs Assessment: post-procedure vital signs reviewed and stable Respiratory status: spontaneous breathing, nonlabored ventilation and respiratory function stable Cardiovascular status: blood pressure returned to baseline Postop Assessment: no apparent nausea or vomiting Anesthetic complications: no   No notable events documented.  Last Vitals:  Vitals:   05/22/21 1214 05/22/21 1216  BP:  (!) 141/100  Pulse: 66   Resp: 16   Temp: 37.1 C   SpO2: 91%     Last Pain:  Vitals:   05/22/21 1214  TempSrc: Oral  PainSc: 0-No pain                 Marthenia Rolling

## 2021-05-22 NOTE — Plan of Care (Signed)
  Problem: Health Behavior/Discharge Planning: Goal: Ability to manage health-related needs will improve Outcome: Progressing   Problem: Activity: Goal: Risk for activity intolerance will decrease Outcome: Progressing   Problem: Nutrition: Goal: Adequate nutrition will be maintained Outcome: Progressing   

## 2021-05-22 NOTE — Progress Notes (Signed)
Reached out to [name] to introduce myself as the office RN Navigator and explain our new patient process. Reviewed the reason for their referral and scheduled their new patient appointment along with labs. Provided address and directions to the office including call back phone number. Reviewed with patient any concerns they may have or any possible barriers to attending their appointment.   Patient is still admitted and unable to find something to write down all the info. Plan made for me to call patient again on Monday to confirm appointment and provide any additional information.   Informed patient about my role as a navigator and that I will meet with them prior to their New Patient appointment and more fully discuss what services I can provide. At this time patient has no further questions or needs.    Oncology Nurse Navigator Documentation  Oncology Nurse Navigator Flowsheets 05/22/2021  Abnormal Finding Date 05/20/2021  Diagnosis Status Additional Work Up  Navigator Follow Up Date: 05/25/2021  Navigator Follow Up Reason: Patient Call  Navigator Location [facility]  Referral Date to RadOnc/MedOnc 05/22/2021  Navigator Encounter Type Introductory Phone Call  Patient Visit Type MedOnc  Treatment Phase Pre-Tx/Tx Discussion  Barriers/Navigation Needs Coordination of Care;Education  Education Other  Interventions Coordination of Care;Education  Acuity Level 2-Minimal Needs (1-2 Barriers Identified)  Coordination of Care Appts  Education Method Verbal  Time Spent with Patient 25

## 2021-05-22 NOTE — Discharge Instructions (Addendum)
Kevin Hunter was admitted to the Hospital on 05/20/2021 and Discharged on Discharge Date 05/23/2021 and should be excused from work/school   for 3  days starting 05/20/2021 , may return to work/school without any restrictions.  Call Bess Harvest MD, [facility] [phone] with questions.  Charlynne Cousins M.D on 05/23/2021,at 10:59 AM  [facility] Office  [phone]  Patient can return back to work on 05/25/2021 has no restrictions.   Flexible Bronchoscopy, Care After This sheet gives you information about how to care for yourself after your test. Your doctor may also give you more specific instructions. If you have problems or questions, contact your doctor. Follow these instructions at home: Eating and drinking Do not eat or drink anything (not even water) for 2 hours after your test, or until your numbing medicine (local anesthetic) wears off. When your numbness is gone and your cough and gag reflexes have come back, you may: Eat only soft foods. Slowly drink liquids. The day after the test, go back to your normal diet. Driving Do not drive for 24 hours if you were given a medicine to help you relax (sedative). Do not drive or use heavy machinery while taking prescription pain medicine. General instructions  Take over-the-counter and prescription medicines only as told by your doctor. Return to your normal activities as told. Ask what activities are safe for you. Do not use any products that have nicotine or tobacco in them. This includes cigarettes and e-cigarettes. If you need help quitting, ask your doctor. Keep all follow-up visits as told by your doctor. This is important. It is very important if you had a tissue sample (biopsy) taken. Get help right away if: You have shortness of breath that gets worse. You get light-headed. You feel like you are going to pass out (faint). You have chest pain. You cough up: More than a little blood. More blood  than before. Summary Do not eat or drink anything (not even water) for 2 hours after your test, or until your numbing medicine wears off. Do not use cigarettes. Do not use e-cigarettes. Get help right away if you have chest pain.  This information is not intended to replace advice given to you by your health care provider. Make sure you discuss any questions you have with your health care provider. Document Released: 05/30/2009 Document Revised: 07/15/2017 Document Reviewed: 08/20/2016 Elsevier Patient Education  2020 [employer].

## 2021-05-23 DIAGNOSIS — R918 Other nonspecific abnormal finding of lung field: Secondary | ICD-10-CM | POA: Diagnosis not present

## 2021-05-23 DIAGNOSIS — F319 Bipolar disorder, unspecified: Secondary | ICD-10-CM | POA: Diagnosis not present

## 2021-05-23 DIAGNOSIS — R519 Headache, unspecified: Secondary | ICD-10-CM | POA: Diagnosis not present

## 2021-05-23 DIAGNOSIS — C7931 Secondary malignant neoplasm of brain: Secondary | ICD-10-CM | POA: Diagnosis not present

## 2021-05-23 MED ORDER — FESOTERODINE FUMARATE ER 8 MG PO TB24
8.0000 mg | ORAL_TABLET | Freq: Every day | ORAL | Status: DC
  Administered 2021-05-23: 8 mg via ORAL
  Filled 2021-05-23: qty 1

## 2021-05-23 MED ORDER — HYDROCODONE-ACETAMINOPHEN 5-325 MG PO TABS: 1.0000 | ORAL_TABLET | ORAL | 0 refills | Status: AC | PRN

## 2021-05-23 MED ORDER — DEXAMETHASONE 4 MG PO TABS: 4.0000 mg | ORAL_TABLET | Freq: Two times a day (BID) | ORAL | 0 refills | Status: DC

## 2021-05-23 MED ORDER — LEVETIRACETAM 500 MG PO TABS: 500.0000 mg | ORAL_TABLET | Freq: Two times a day (BID) | ORAL | 0 refills | Status: DC

## 2021-05-23 NOTE — Progress Notes (Signed)
DISCHARGE NOTE HOME E[facility]I [facility] to be discharged Home per MD order. Discussed prescriptions and follow up appointments with the patient. Prescriptions given to patient; medication list explained in detail. Patient verbalized understanding.  Skin clean, dry and intact without evidence of skin break down, no evidence of skin tears noted. IV catheter discontinued intact. Site without signs and symptoms of complications. Dressing and pressure applied. Pt denies pain at the site currently. No complaints noted.  Patient free of lines, drains, and wounds.   An After Visit Summary (AVS) was printed and given to the patient. Patient escorted via wheelchair, and discharged home via private auto.  Gorham, Zenon Mayo, RN

## 2021-05-23 NOTE — Plan of Care (Signed)
  Problem: Education: Goal: Knowledge of General Education information will improve Description: Including pain rating scale, medication(s)/side effects and non-pharmacologic comfort measures Outcome: Adequate for Discharge   

## 2021-05-23 NOTE — Discharge Summary (Addendum)
Physician Discharge Summary  Kevin Hunter:[id number] DOB: 25-Dec-1965 DOA: 05/20/2021  PCP: Kevin Shelter, PA-C  Admit date: 05/20/2021 Discharge date: 05/23/2021  Admitted From: Home Disposition:  home  Recommendations for Outpatient Follow-up:  Follow up with PCP in 1-2 weeks Please obtain BMP/CBC in one week  Home Health:home Equipment/Devices:home  Discharge Condition:Stable CODE STATUS:Full Diet recommendation: Heart Healthy   Brief/Interim Summary:  55 y.o. male past medical history significant for hypertension who came in with headache, noncontrast CT of the head showed a parenchymal lesion in the right frontal and left parietal lobe with surrounding edema but no midline shift, CT scan of the abdomen chest and pelvis with contrast showing mass in the right lung apex measuring 6 x 4 cm and mildly enlarged right hilar lymphadenopathy, the case was discussed with neurology was started on IV Keppra and Decadron  Discharge Diagnoses:  Principal Problem:   Brain metastasis ([place]) Active Problems:   Hyponatremia   Bipolar 1 disorder ([place])   Anxiety   Hypertension   Lung mass  Non-small cell Carcinoma with brain metastasis: Pulmonary was consulted and is status post bronchoscopy with biopsy. Results will probably be back the week of the 05/25/2021. We have already contacted the neuro oncologist Kevin Hunter which she will follow-up as an outpatient. He will follow biopsy results with Kevin Hunter as an outpatient. He will continue on Decadron and Keppra prophylactically as an outpatient.  Acute hyperosmolar euvolemic hyponatremia: Resolved with IV fluid.  Bipolar disorder type I: No change made to his medication continue current regimen.  General anxiety disorder: Continue Effexor and clonazepam.  Essential hypertension: No changes to his medication continue Norvasc.  Discharge Instructions  Discharge Instructions     Diet - low sodium heart healthy   Complete  by: As directed    Increase activity slowly   Complete by: As directed       Allergies as of 05/23/2021       Reactions   Penicillins         Medication List     TAKE these medications    amLODipine 5 MG tablet Commonly known as: NORVASC Take 5 mg by mouth at bedtime.   clonazePAM 1 MG tablet Commonly known as: KLONOPIN Take 1 mg by mouth 2 (two) times daily as needed for anxiety (May take additional 0.5mg  as needed for panic attack).   dexamethasone 4 MG tablet Commonly known as: DECADRON Take 1 tablet (4 mg total) by mouth 2 (two) times daily for 14 days.   HYDROcodone-acetaminophen 5-325 MG tablet Commonly known as: NORCO/VICODIN Take 1-2 tablets by mouth every 4 (four) hours as needed for up to 5 days for severe pain.   ibuprofen 200 MG tablet Commonly known as: ADVIL Take 800 mg by mouth every 6 (six) hours as needed for headache.   levETIRAcetam 500 MG tablet Commonly known as: KEPPRA Take 1 tablet (500 mg total) by mouth 2 (two) times daily.   pseudoephedrine 30 MG tablet Commonly known as: SUDAFED Take 60 mg by mouth every 4 (four) hours as needed (headache).   psyllium 58.6 % packet Commonly known as: METAMUCIL Take 1 packet by mouth daily.   QUEtiapine 400 MG 24 hr tablet Commonly known as: SEROQUEL XR Take 400 mg by mouth at bedtime.   tolterodine 4 MG 24 hr capsule Commonly known as: DETROL LA Take 4 mg by mouth at bedtime.   traZODone 100 MG tablet Commonly known as: DESYREL Take 200 mg by mouth at  bedtime.   venlafaxine 75 MG tablet Commonly known as: EFFEXOR Take 150 mg by mouth at bedtime.        Follow-up Information     Kevin Bears, MD. Schedule an appointment as soon as possible for a visit.   Specialty: Oncology Contact information: [place] [address] [phone]         Kevin Nash, DO. Schedule an appointment as soon as possible for a visit.   Specialty: Pulmonary  Disease Contact information: [place] [address] [phone]                Allergies  Allergen Reactions   Penicillins     Consultations: Pulmonary   Procedures/Studies: CT HEAD WO CONTRAST (5MM)  Result Date: 05/20/2021 CLINICAL DATA:  Three weeks of headache, dizziness, trouble writing and typing EXAM: CT HEAD WITHOUT CONTRAST TECHNIQUE: Contiguous axial images were obtained from the base of the skull through the vertex without intravenous contrast. COMPARISON:  None. FINDINGS: Brain: There is a 1.5 cm centrally hypodense lesion in the right frontal lobe with significant surrounding vasogenic edema with sparing of the overlying white matter. There is a similar appearing lesion in the left parietal lobe measuring 1.2 cm with surrounding edema. There is mild vasogenic edema in the left inferior frontal gyrus (4-60). There is no evidence of acute intracranial hemorrhage or infarct. There is no extra-axial fluid collection Mass effect from the above described edema slightly effaces the body of the right lateral ventricle. There is no midline shift. Vascular: No hyperdense vessel or unexpected calcification. Skull: Normal. Negative for fracture or focal lesion. Sinuses/Orbits: The imaged paranasal sinuses are clear. The globes and orbits are unremarkable. Other: None. IMPRESSION: Discrete parenchymal lesions in the right frontal and left parietal lobes with marked surrounding vasogenic edema described above most suspicious for metastases. Mild vasogenic edema in the left inferior frontal gyrus raises suspicion for an additional lesion in this location. Brain MRI with and without contrast is recommended for further characterization. Electronically Signed   By: Valetta Mole M.D.   On: 05/20/2021 13:49   MR Brain W and Wo Contrast  Result Date: 05/21/2021 CLINICAL DATA:  55 year old male who presented with headache, dizziness and abnormal head CT revealing  multifocal vasogenic looking cerebral edema. Subsequent CT Chest, Abdomen, and Pelvis demonstrates a large right upper lung mass. Staging. EXAM: MRI HEAD WITHOUT AND WITH CONTRAST TECHNIQUE: Multiplanar, multiecho pulse sequences of the brain and surrounding structures were obtained without and with intravenous contrast. CONTRAST:  6mL GADAVIST GADOBUTROL 1 MMOL/ML IV SOLN COMPARISON:  Head CT without contrast 05/20/2021. FINDINGS: Brain: Nodular, irregular heterogeneously enhancing masses in the right superior frontal gyrus (21 mm series 20, image 48) and left posterior parietal lobe (20 mm image 38) correspond to the areas of prominent vasogenic edema seen by CT. Faint hemosiderin associated with the left parietal lesion. Edema is stable since yesterday. Mild regional mass effect, but no midline shift. Smaller 7-8 mm enhancing nodular lesion in the anterior left frontal lobe corresponds to a more subtle area vasogenic edema there (series 20, image 36). No other abnormal brain enhancement identified. No dural thickening. No superimposed restricted diffusion to suggest acute infarction. No midline shift, ventriculomegaly, extra-axial collection or acute intracranial hemorrhage. Cervicomedullary junction and pituitary are within normal limits. Outside of the areas of edema, gray and white matter signal is within normal limits. No cortical encephalomalacia or other chronic cerebral blood products. Vascular: Major intracranial  vascular flow voids are preserved, the left vertebral artery appears dominant. Following contrast the major dural venous sinuses are enhancing and appear to be patent. Skull and upper cervical spine: Heterogeneous decreased T1 marrow signal in the skull and upper cervical spine. But no destructive osseous lesion identified. Negative visible cervical spinal cord. Sinuses/Orbits: Negative. Other: Mastoids are clear. Visible internal auditory structures appear normal. Negative visible + scalp and  face. IMPRESSION: 1. Three enhancing brain metastases, ranging from 7-8 mm (anterior left frontal lobe) to 21 mm (right superior frontal gyrus). Associated vasogenic edema is stable from the CT yesterday. No midline shift or impending herniation. 2. Nonspecific decreased T1 marrow signal in the skull and upper cervical spine, but no destructive osseous lesion to strongly indicate osseous metastases. Electronically Signed   By: Genevie Ann M.D.   On: 05/21/2021 08:32   CT CHEST ABDOMEN PELVIS W CONTRAST  Result Date: 05/20/2021 CLINICAL DATA:  Evaluate for metastatic disease EXAM: CT CHEST, ABDOMEN, AND PELVIS WITH CONTRAST TECHNIQUE: Multidetector CT imaging of the chest, abdomen and pelvis was performed following the standard protocol during bolus administration of intravenous contrast. CONTRAST:  14mL OMNIPAQUE IOHEXOL 300 MG/ML  SOLN COMPARISON:  None. FINDINGS: CT CHEST FINDINGS Cardiovascular: Normal heart size. No pericardial effusion. No significant coronary artery calcifications. No significant atherosclerotic disease of the thoracic aorta. Mediastinum/Nodes: Esophagus and thyroid are unremarkable. Enlarged right hilar lymph node measuring 1.3 cm in short axis on series 2, image 27. Enlarged right lower paratracheal lymph node measuring 1.4 cm short axis on series 2, image 23. Lungs/Pleura: Central airways are patent upper lobe predominant centrilobular and paraseptal emphysema. Mass of the right lung apex measuring 6.8 x 4.2 cm which abuts the lateral chest wall. Musculoskeletal: No chest wall mass or suspicious bone lesions identified. CT ABDOMEN PELVIS FINDINGS Hepatobiliary: No focal liver abnormality is seen. No gallstones, gallbladder wall thickening, or biliary dilatation. Pancreas: Unremarkable. No pancreatic ductal dilatation or surrounding inflammatory changes. Spleen: Normal in size without focal abnormality. Adrenals/Urinary Tract: Indeterminate right adrenal nodule measuring 2.7 cm. Kidneys  enhance symmetrically with no evidence of hydronephrosis or nephrolithiasis. Bladder is unremarkable. Stomach/Bowel: Stomach is within normal limits. Appendix appears normal. No evidence of bowel wall thickening, distention, or inflammatory changes. Vascular/Lymphatic: Aortic atherosclerosis. No enlarged abdominal or pelvic lymph nodes. Reproductive: Prostate is unremarkable. Other: No abdominal wall hernia or abnormality. No abdominopelvic ascites. Musculoskeletal: T8-T9 endplate lucencies, likely due to Schmorl's nodes. No aggressive appearing osseous lesions. IMPRESSION: Mass of the right lung apex measuring 6.8 x 4.2 cm which abuts the lateral chest wall, favor primary lung malignancy. Mildly enlarged right hilar and mediastinal lymph nodes, concerning for metastatic disease. Indeterminate right adrenal nodule, differential includes metastatic disease versus a lipid poor adenoma. Aortic Atherosclerosis (ICD10-I70.0) and Emphysema (ICD10-J43.9). Electronically Signed   By: Yetta Glassman M.D.   On: 05/20/2021 15:29   DG CHEST PORT 1 VIEW  Result Date: 05/22/2021 CLINICAL DATA:  Status post bronchoscopy with biopsy. EXAM: PORTABLE CHEST 1 VIEW COMPARISON:  CT chest abdomen pelvis 05/20/2021 FINDINGS: Of note, the patient is mildly rotated on this portable examination. Nodular opacity at the right apex corresponds to the pulmonary mass seen on CT 05/20/2021. Bibasilar subsegmental atelectasis. No pleural effusion or pneumothorax. Heart is normal in size, accounting for patient positioning. Visualized skeletal structures are unremarkable. IMPRESSION: Right apical lung mass.  Bibasilar atelectasis.  No pneumothorax. Electronically Signed   By: Ileana Roup M.D.   On: 05/22/2021 11:12   DG C-ARM BRONCHOSCOPY  Result Date: 05/22/2021 C-ARM BRONCHOSCOPY: Fluoroscopy was utilized by the requesting physician.  No radiographic interpretation.   (Subjective: No complains Discharge Exam: Vitals:   05/23/21  0416 05/23/21 0851  BP: 119/82 (!) 132/91  Pulse: 64 70  Resp: 18 18  Temp: 97.6 F (36.4 C) 97.8 F (36.6 C)  SpO2: 94% 96%   Vitals:   05/22/21 1810 05/22/21 2049 05/23/21 0416 05/23/21 0851  BP: (!) 143/92 (!) 139/95 119/82 (!) 132/91  Pulse: 75 68 64 70  Resp: 16 18 18 18   Temp: 98 F (36.7 C) 98.1 F (36.7 C) 97.6 F (36.4 C) 97.8 F (36.6 C)  TempSrc:  Oral Oral Oral  SpO2: 92% 90% 94% 96%  Weight:   128.7 kg   Height:        General: Pt is alert, awake, not in acute distress Cardiovascular: RRR, S1/S2 +, no rubs, no gallops Respiratory: CTA bilaterally, no wheezing, no rhonchi Abdominal: Soft, NT, ND, bowel sounds + Extremities: no edema, no cyanosis    The results of significant diagnostics from this hospitalization (including imaging, microbiology, ancillary and laboratory) are listed below for reference.     Microbiology: Recent Results (from the past 240 hour(s))  Resp Panel by RT-PCR (Flu A&B, Covid) Nasopharyngeal Swab     Status: None   Collection Time: 05/20/21  2:53 PM   Specimen: Nasopharyngeal Swab; Nasopharyngeal(NP) swabs in vial transport medium  Result Value Ref Range Status   SARS Coronavirus 2 by RT PCR NEGATIVE NEGATIVE Final    Comment: (NOTE) SARS-CoV-2 target nucleic acids are NOT DETECTED.  The SARS-CoV-2 RNA is generally detectable in upper respiratory specimens during the acute phase of infection. The lowest concentration of SARS-CoV-2 viral copies this assay can detect is 138 copies/mL. A negative result does not preclude SARS-Cov-2 infection and should not be used as the sole basis for treatment or other patient management decisions. A negative result may occur with  improper specimen collection/handling, submission of specimen other than nasopharyngeal swab, presence of viral mutation(s) within the areas targeted by this assay, and inadequate number of viral copies(<138 copies/mL). A negative result must be combined  with clinical observations, patient history, and epidemiological information. The expected result is Negative.  Fact Sheet for Patients:  EntrepreneurPulse.com.au  Fact Sheet for Healthcare Providers:  IncredibleEmployment.be  This test is no t yet approved or cleared by the [place] FDA and  has been authorized for detection and/or diagnosis of SARS-CoV-2 by FDA under an Emergency Use Authorization (EUA). This EUA will remain  in effect (meaning this test can be used) for the duration of the COVID-19 declaration under Section 564(b)(1) of the Act, 21 U.S.C.section 360bbb-3(b)(1), unless the authorization is terminated  or revoked sooner.       Influenza A by PCR NEGATIVE NEGATIVE Final   Influenza B by PCR NEGATIVE NEGATIVE Final    Comment: (NOTE) The Xpert Xpress SARS-CoV-2/FLU/RSV plus assay is intended as an aid in the diagnosis of influenza from Nasopharyngeal swab specimens and should not be used as a sole basis for treatment. Nasal washings and aspirates are unacceptable for Xpert Xpress SARS-CoV-2/FLU/RSV testing.  Fact Sheet for Patients: EntrepreneurPulse.com.au  Fact Sheet for Healthcare Providers: IncredibleEmployment.be  This test is not yet approved or cleared by the [place] FDA and has been authorized for detection and/or diagnosis of SARS-CoV-2 by FDA under an Emergency Use Authorization (EUA). This EUA will remain in effect (meaning this test can be used) for the duration of the  COVID-19 declaration under Section 564(b)(1) of the Act, 21 U.S.C. section 360bbb-3(b)(1), unless the authorization is terminated or revoked.  Performed at [facility], [address] [place], [place] [address]      Labs: BNP (last 3 results) No results for input(s): BNP in the last 8760 hours. Basic Metabolic Panel: Recent Labs  Lab 05/20/21 1324 05/21/21 0457  NA 133* 135   K 3.4* 4.2  CL 102 101  CO2 24 23  GLUCOSE 91 122*  BUN 16 14  CREATININE 1.14 1.10  CALCIUM 8.8* 9.4  MG  --  2.3   Liver Function Tests: Recent Labs  Lab 05/20/21 1324 05/21/21 0457  AST 22 25  ALT 24 26  ALKPHOS 72 76  BILITOT 0.3 0.4  PROT 7.8 8.3*  ALBUMIN 4.1 4.0   Recent Labs  Lab 05/20/21 1324  LIPASE 31   No results for input(s): AMMONIA in the last 168 hours. CBC: Recent Labs  Lab 05/20/21 1324 05/21/21 0457  WBC 9.7 11.3*  NEUTROABS 7.2  --   HGB 14.5 15.7  HCT 42.1 45.4  MCV 86.8 86.5  PLT 261 280   Cardiac Enzymes: No results for input(s): CKTOTAL, CKMB, CKMBINDEX, TROPONINI in the last 168 hours. BNP: Invalid input(s): POCBNP CBG: No results for input(s): GLUCAP in the last 168 hours. D-Dimer No results for input(s): DDIMER in the last 72 hours. Hgb A1c No results for input(s): HGBA1C in the last 72 hours. Lipid Profile No results for input(s): CHOL, HDL, LDLCALC, TRIG, CHOLHDL, LDLDIRECT in the last 72 hours. Thyroid function studies Recent Labs    05/20/21 1324  TSH 1.802   Anemia work up No results for input(s): VITAMINB12, FOLATE, FERRITIN, TIBC, IRON, RETICCTPCT in the last 72 hours. Urinalysis    Component Value Date/Time   COLORURINE YELLOW 05/21/2021 0827   APPEARANCEUR CLEAR 05/21/2021 0827   LABSPEC 1.034 (H) 05/21/2021 0827   PHURINE 5.0 05/21/2021 0827   GLUCOSEU NEGATIVE 05/21/2021 0827   HGBUR NEGATIVE 05/21/2021 0827   BILIRUBINUR NEGATIVE 05/21/2021 0827   KETONESUR NEGATIVE 05/21/2021 0827   PROTEINUR 30 (A) 05/21/2021 0827   NITRITE NEGATIVE 05/21/2021 0827   LEUKOCYTESUR NEGATIVE 05/21/2021 0827   Sepsis Labs Invalid input(s): PROCALCITONIN,  WBC,  LACTICIDVEN Microbiology Recent Results (from the past 240 hour(s))  Resp Panel by RT-PCR (Flu A&B, Covid) Nasopharyngeal Swab     Status: None   Collection Time: 05/20/21  2:53 PM   Specimen: Nasopharyngeal Swab; Nasopharyngeal(NP) swabs in vial transport  medium  Result Value Ref Range Status   SARS Coronavirus 2 by RT PCR NEGATIVE NEGATIVE Final    Comment: (NOTE) SARS-CoV-2 target nucleic acids are NOT DETECTED.  The SARS-CoV-2 RNA is generally detectable in upper respiratory specimens during the acute phase of infection. The lowest concentration of SARS-CoV-2 viral copies this assay can detect is 138 copies/mL. A negative result does not preclude SARS-Cov-2 infection and should not be used as the sole basis for treatment or other patient management decisions. A negative result may occur with  improper specimen collection/handling, submission of specimen other than nasopharyngeal swab, presence of viral mutation(s) within the areas targeted by this assay, and inadequate number of viral copies(<138 copies/mL). A negative result must be combined with clinical observations, patient history, and epidemiological information. The expected result is Negative.  Fact Sheet for Patients:  EntrepreneurPulse.com.au  Fact Sheet for Healthcare Providers:  IncredibleEmployment.be  This test is no t yet approved or cleared by the [place]  States FDA and  has been authorized for detection and/or diagnosis of SARS-CoV-2 by FDA under an Emergency Use Authorization (EUA). This EUA will remain  in effect (meaning this test can be used) for the duration of the COVID-19 declaration under Section 564(b)(1) of the Act, 21 U.S.C.section 360bbb-3(b)(1), unless the authorization is terminated  or revoked sooner.       Influenza A by PCR NEGATIVE NEGATIVE Final   Influenza B by PCR NEGATIVE NEGATIVE Final    Comment: (NOTE) The Xpert Xpress SARS-CoV-2/FLU/RSV plus assay is intended as an aid in the diagnosis of influenza from Nasopharyngeal swab specimens and should not be used as a sole basis for treatment. Nasal washings and aspirates are unacceptable for Xpert Xpress SARS-CoV-2/FLU/RSV testing.  Fact Sheet for  Patients: EntrepreneurPulse.com.au  Fact Sheet for Healthcare Providers: IncredibleEmployment.be  This test is not yet approved or cleared by the [place] FDA and has been authorized for detection and/or diagnosis of SARS-CoV-2 by FDA under an Emergency Use Authorization (EUA). This EUA will remain in effect (meaning this test can be used) for the duration of the COVID-19 declaration under Section 564(b)(1) of the Act, 21 U.S.C. section 360bbb-3(b)(1), unless the authorization is terminated or revoked.  Performed at [facility], [address] [place], [place] [address]      SIGNED:   Charlynne Cousins, MD  [facility] 05/23/2021, 10:58 AM Pager   If 7PM-7AM, please contact night-coverage [email or web address] Password [email or web address]

## 2021-05-23 NOTE — Progress Notes (Signed)
PCCM:  Social visit today.  Doing well post bronchoscopy.  Follow up with medical oncology, rad onc and me to be scheduled   Garner Nash, DO [facility] 05/23/2021 11:27 AM

## 2021-05-24 ENCOUNTER — Encounter (HOSPITAL_COMMUNITY): Payer: Self-pay | Admitting: Pulmonary Disease

## 2021-05-25 ENCOUNTER — Inpatient Hospital Stay: Payer: Managed Care, Other (non HMO) | Attending: Internal Medicine

## 2021-05-25 ENCOUNTER — Encounter: Payer: Self-pay | Admitting: *Deleted

## 2021-05-25 DIAGNOSIS — C778 Secondary and unspecified malignant neoplasm of lymph nodes of multiple regions: Secondary | ICD-10-CM | POA: Insufficient documentation

## 2021-05-25 DIAGNOSIS — C7931 Secondary malignant neoplasm of brain: Secondary | ICD-10-CM | POA: Insufficient documentation

## 2021-05-25 DIAGNOSIS — I10 Essential (primary) hypertension: Secondary | ICD-10-CM | POA: Insufficient documentation

## 2021-05-25 DIAGNOSIS — C3411 Malignant neoplasm of upper lobe, right bronchus or lung: Secondary | ICD-10-CM | POA: Insufficient documentation

## 2021-05-25 DIAGNOSIS — C7971 Secondary malignant neoplasm of right adrenal gland: Secondary | ICD-10-CM | POA: Insufficient documentation

## 2021-05-25 NOTE — Progress Notes (Signed)
Spoke to patient and reviewed his appointment time for this week. Time, date and location reviewed and confirmed with patient. He has no further questions at this time.   Oncology Nurse Navigator Documentation  Oncology Nurse Navigator Flowsheets 05/25/2021  Abnormal Finding Date -  Diagnosis Status -  Navigator Follow Up Date: 05/27/2021  Navigator Follow Up Reason: New Patient Appointment  Navigator Location [facility]  Referral Date to RadOnc/MedOnc -  Navigator Encounter Type Appt/Treatment Plan Review  Patient Visit Type MedOnc  Treatment Phase Pre-Tx/Tx Discussion  Barriers/Navigation Needs Coordination of Care;Education  Education Other  Interventions Education;Psycho-Social Support  Acuity Level 2-Minimal Needs (1-2 Barriers Identified)  Coordination of Care -  Education Method Verbal;Teach-back  Time Spent with Patient 15

## 2021-05-26 ENCOUNTER — Other Ambulatory Visit: Payer: Self-pay | Admitting: Radiation Therapy

## 2021-05-26 DIAGNOSIS — C7931 Secondary malignant neoplasm of brain: Secondary | ICD-10-CM

## 2021-05-26 LAB — CYTOLOGY - NON PAP

## 2021-05-27 ENCOUNTER — Inpatient Hospital Stay (HOSPITAL_BASED_OUTPATIENT_CLINIC_OR_DEPARTMENT_OTHER): Payer: Managed Care, Other (non HMO) | Admitting: Hematology & Oncology

## 2021-05-27 ENCOUNTER — Other Ambulatory Visit: Payer: Self-pay

## 2021-05-27 ENCOUNTER — Encounter: Payer: Self-pay | Admitting: *Deleted

## 2021-05-27 ENCOUNTER — Inpatient Hospital Stay: Payer: Managed Care, Other (non HMO)

## 2021-05-27 ENCOUNTER — Encounter: Payer: Self-pay | Admitting: Hematology & Oncology

## 2021-05-27 VITALS — BP 123/80 | HR 89 | Temp 97.9°F | Resp 18 | Ht 70.0 in | Wt 286.0 lb

## 2021-05-27 DIAGNOSIS — R918 Other nonspecific abnormal finding of lung field: Secondary | ICD-10-CM

## 2021-05-27 DIAGNOSIS — C7971 Secondary malignant neoplasm of right adrenal gland: Secondary | ICD-10-CM | POA: Diagnosis not present

## 2021-05-27 DIAGNOSIS — C3411 Malignant neoplasm of upper lobe, right bronchus or lung: Secondary | ICD-10-CM | POA: Diagnosis not present

## 2021-05-27 DIAGNOSIS — C7931 Secondary malignant neoplasm of brain: Secondary | ICD-10-CM

## 2021-05-27 DIAGNOSIS — C3491 Malignant neoplasm of unspecified part of right bronchus or lung: Secondary | ICD-10-CM

## 2021-05-27 DIAGNOSIS — Z021 Encounter for pre-employment examination: Secondary | ICD-10-CM | POA: Insufficient documentation

## 2021-05-27 DIAGNOSIS — I10 Essential (primary) hypertension: Secondary | ICD-10-CM

## 2021-05-27 DIAGNOSIS — C3490 Malignant neoplasm of unspecified part of unspecified bronchus or lung: Secondary | ICD-10-CM

## 2021-05-27 DIAGNOSIS — Z7189 Other specified counseling: Secondary | ICD-10-CM

## 2021-05-27 DIAGNOSIS — C778 Secondary and unspecified malignant neoplasm of lymph nodes of multiple regions: Secondary | ICD-10-CM | POA: Diagnosis not present

## 2021-05-27 HISTORY — DX: Malignant neoplasm of unspecified part of right bronchus or lung: C34.91

## 2021-05-27 HISTORY — DX: Other specified counseling: Z71.89

## 2021-05-27 LAB — CBC WITH DIFFERENTIAL (CANCER CENTER ONLY)
Abs Immature Granulocytes: 0.16 10*3/uL — ABNORMAL HIGH (ref 0.00–0.07)
Basophils Absolute: 0 10*3/uL (ref 0.0–0.1)
Basophils Relative: 0 %
Eosinophils Absolute: 0.2 10*3/uL (ref 0.0–0.5)
Eosinophils Relative: 2 %
HCT: 43.4 % (ref 39.0–52.0)
Hemoglobin: 14.7 g/dL (ref 13.0–17.0)
Immature Granulocytes: 1 %
Lymphocytes Relative: 16 %
Lymphs Abs: 1.9 10*3/uL (ref 0.7–4.0)
MCH: 29.6 pg (ref 26.0–34.0)
MCHC: 33.9 g/dL (ref 30.0–36.0)
MCV: 87.5 fL (ref 80.0–100.0)
Monocytes Absolute: 1.1 10*3/uL — ABNORMAL HIGH (ref 0.1–1.0)
Monocytes Relative: 10 %
Neutro Abs: 8.3 10*3/uL — ABNORMAL HIGH (ref 1.7–7.7)
Neutrophils Relative %: 71 %
Platelet Count: 257 10*3/uL (ref 150–400)
RBC: 4.96 MIL/uL (ref 4.22–5.81)
RDW: 14.6 % (ref 11.5–15.5)
WBC Count: 11.8 10*3/uL — ABNORMAL HIGH (ref 4.0–10.5)
nRBC: 0 % (ref 0.0–0.2)

## 2021-05-27 LAB — CMP (CANCER CENTER ONLY)
ALT: 17 U/L (ref 0–44)
AST: 13 U/L — ABNORMAL LOW (ref 15–41)
Albumin: 3.9 g/dL (ref 3.5–5.0)
Alkaline Phosphatase: 56 U/L (ref 38–126)
Anion gap: 9 (ref 5–15)
BUN: 15 mg/dL (ref 6–20)
CO2: 28 mmol/L (ref 22–32)
Calcium: 9.5 mg/dL (ref 8.9–10.3)
Chloride: 100 mmol/L (ref 98–111)
Creatinine: 1.24 mg/dL (ref 0.61–1.24)
GFR, Estimated: >60 mL/min (ref >60)
Glucose, Bld: 111 mg/dL — ABNORMAL HIGH (ref 70–99)
Potassium: 3.5 mmol/L (ref 3.5–5.1)
Sodium: 137 mmol/L (ref 135–145)
Total Bilirubin: 0.4 mg/dL (ref 0.3–1.2)
Total Protein: 6.8 g/dL (ref 6.5–8.1)

## 2021-05-27 LAB — LACTATE DEHYDROGENASE: LDH: 170 U/L (ref 98–192)

## 2021-05-27 MED ORDER — DEXAMETHASONE 6 MG PO TABS: 6.0000 mg | ORAL_TABLET | Freq: Two times a day (BID) | ORAL | 2 refills | Status: DC

## 2021-05-27 NOTE — Progress Notes (Signed)
Location/Histology of Brain Tumor: Left Frontal lobe, right superior frontal gyrus, Right Lung apex, right hilar and mediastinal lymph nodes.  Patient presented with reports of 1 month history of intermittent but progressive right sided headaches associated with the right frontotemporal region.  He notes over the past 3 weeks, weakness in his legs, left worse than right.  He also reports some difficulty with spelling over the same period.  PET:  3T MRI Brain 06/04/2021:  Bronchoscopy 05/22/2021:  MRI Brain 05/21/2021: Three enhancing brain metastases, ranging from 7-8 mm (anterior left frontal lobe) to 21 mm (right superior frontal gyrus). Associated vasogenic edema is stable from the CT yesterday. No midline shift or impending herniation.  CT Chest 05/20/2021: Mass of the right lung apex measuring 6.8 x 4.2 cm which abuts the lateral chest wall, favor primary lung malignancy.  Mildly enlarged right hilar and mediastinal lymph nodes, concerning for metastatic disease.  Indeterminate right adrenal nodule, differential includes metastatic disease versus a lipid poor adenoma.  CT Head 05/20/2021: There is a 1.5 cm centrally hypodense lesion in the right frontal lobe with significant surrounding vasogenic edema with sparing of the overlying white matter. There is a similar appearing lesion in the left parietal lobe measuring 1.2 cm with surrounding edema.  Mild vasogenic edema in the left inferior frontal gyrus raises suspicion for an additional lesion in this Location.  Cytology: Right Upper Lobe Lung 05/22/2021  Past/Anticipated interventions by cardiothoracic surgery, if any:  Past or anticipated interventions, if any, per neurosurgery:   Past or anticipated interventions, if any, per medical oncology:  Dr. Marin Olp 05/27/2021  Dose of Decadron, if applicable: 6 mg BID, has not started  Tobacco/Marijuana/Snuff/ETOH use: Former smoker, quit 3 years ago.  He reports he has been  vaping.   Signs/Symptoms Weight changes, if any: Stable Respiratory complaints, if any: None Hemoptysis, if any: None Pain issues, if any:  Denies chest pain, pressure, or tightness.  Recent neurologic symptoms, if any:  Seizures:  Headaches: Right frontal headache, intermittent, "depends on level of activity".  Taking tylenol, some relief.  Does not want to take hydrocodone for headache, its not that bad. Nausea: No Dizziness/ataxia: Reports gait instability and dizziness. Difficulty with hand coordination: He reports he is not dropping or having difficulty picking up things.  He reports difficulty with typing a text, fingers just won't do what his minds tells them too. Focal numbness/weakness: Has occasional numbness in his right ankle. Visual deficits/changes: Denies Confusion/Memory deficits: Reports both confusion and memory loss.   SAFETY ISSUES: Prior radiation? no Pacemaker/ICD? No Possible current pregnancy? N/a Is the patient on methotrexate? No  Additional Complaints / other details:

## 2021-05-27 NOTE — Progress Notes (Signed)
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  HPI: Kevin Hunter is a very interesting man.  He is a 55 year old white male.  He [place].  He has a very interesting job.  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  I talked him quite a bit about this.  It is incredibly interesting as what he does.  Has been healthy his whole life.  He has smoke.  He says back in the day, he was smoking 2 packs a day.  He still smokes a little bit.  He went to the emergency room back on 05/20/2021.  At the time, he had 1 month of intermittent right-sided headaches.  There is no visual changes.  He had no nausea or vomiting.  He had no cough.  Had little bit of weakness in the left lower extremity about 2 weeks prior to his admission.  He come again had the headaches but no other symptoms.  He began to have some difficulty with spelling.  He was having difficulty at work.  It was subsequently felt that he needed to go to the emergency room.  He went to the emergency room at [facility].  He subsequently had a CT of the brain done.  This showed some parenchymal lesions.  CT of the body showed a right lung apex mass measuring 6.8 x 4.2 cm.  There was some mildly enlarged right hilar and mediastinal lymph nodes.  He subsequently was admitted to Hospital.  He Had an MRI Done.  The MRI Showed 3 Lesions in the Brain.  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  He Was Seen by Pulmonary.  He did undergo a bronchoscopy.  This was done on 05/22/2021.  Biopsies were taken.  Unfortunately, the were not all that significant.  However, there was a diagnosis made.  The pathology report ([id number]) showed nonsmall cell carcinoma consistent with squamous cell carcinoma.  There was not enough material for any  molecular studies.  We are sending blood off for a liquid biopsy.  He was subsequently sent home.  He was not placed on any Decadron at home.  Has little bit of a headache.  He has had no hemoptysis.  He has had no cough.  He has not lost weight.  There is no change in bowel or bladder habits.  He has had no rashes.  He has had no swollen lymph nodes.  There has been no hoarseness.  Overall, his performance status is ECOG 0.    Past Medical History:  Diagnosis Date   Anxiety    Bipolar 1 disorder ([place])    Hypertension    Mood disorder ([place])    Sleep apnea   :   Past Surgical History:  Procedure Laterality Date   BRONCHIAL BIOPSY  05/22/2021   Procedure: BRONCHIAL BIOPSIES;  Surgeon: Garner Nash, DO;  Location: [facility];  Service: Pulmonary;;   BRONCHIAL BRUSHINGS  05/22/2021   Procedure: BRONCHIAL BRUSHINGS;  Surgeon: Garner Nash, DO;  Location: [facility];  Service: Pulmonary;;   BRONCHIAL NEEDLE ASPIRATION BIOPSY  05/22/2021   Procedure: BRONCHIAL NEEDLE ASPIRATION BIOPSIES;  Surgeon: Garner Nash, DO;  Location: [facility];  Service: Pulmonary;;   VIDEO BRONCHOSCOPY WITH ENDOBRONCHIAL NAVIGATION N/A  05/22/2021   Procedure: VIDEO BRONCHOSCOPY WITH ENDOBRONCHIAL NAVIGATION;  Surgeon: Garner Nash, DO;  Location: [facility];  Service: Pulmonary;  Laterality: N/A;   VIDEO BRONCHOSCOPY WITH RADIAL ENDOBRONCHIAL ULTRASOUND  05/22/2021   Procedure: RADIAL ENDOBRONCHIAL ULTRASOUND;  Surgeon: Garner Nash, DO;  Location: [facility];  Service: Pulmonary;;  :   Current Outpatient Medications:    amLODipine (NORVASC) 5 MG tablet, Take 5 mg by mouth at bedtime., Disp: , Rfl:    clonazePAM (KLONOPIN) 1 MG tablet, Take 1 mg by mouth 2 (two) times daily as needed for anxiety (May take additional 0.5mg  as needed for panic attack)., Disp: , Rfl:    HYDROcodone-acetaminophen (NORCO/VICODIN) 5-325 MG tablet, Take 1-2 tablets by mouth every 4 (four) hours as  needed for up to 5 days for severe pain., Disp: 30 tablet, Rfl: 0   ibuprofen (ADVIL) 200 MG tablet, Take 800 mg by mouth every 6 (six) hours as needed for headache., Disp: , Rfl:    pseudoephedrine (SUDAFED) 30 MG tablet, Take 60 mg by mouth every 4 (four) hours as needed (headache)., Disp: , Rfl:    psyllium (METAMUCIL) 58.6 % packet, Take 1 packet by mouth daily., Disp: , Rfl:    QUEtiapine (SEROQUEL XR) 400 MG 24 hr tablet, Take 400 mg by mouth at bedtime., Disp: , Rfl:    tolterodine (DETROL LA) 4 MG 24 hr capsule, Take 4 mg by mouth at bedtime., Disp: , Rfl:    traZODone (DESYREL) 100 MG tablet, Take 200 mg by mouth at bedtime., Disp: , Rfl:    venlafaxine (EFFEXOR) 75 MG tablet, Take 150 mg by mouth at bedtime., Disp: , Rfl: :  :   Allergies  Allergen Reactions   Penicillins Other (See Comments)    Unknown Reaction when he was an infant.  :  No family history on file.:   Social History   Socioeconomic History   Marital status: Single    Spouse name: Not on file   Number of children: Not on file   Years of education: Not on file   Highest education level: Not on file  Occupational History   Not on file  Tobacco Use   Smoking status: Former    Types: Cigarettes    Quit date: 05/16/2018    Years since quitting: 3.0   Smokeless tobacco: Never  Vaping Use   Vaping Use: Every day   Start date: 05/16/2018  Substance and Sexual Activity   Alcohol use: Never   Drug use: Never   Sexual activity: Not on file  Other Topics Concern   Not on file  Social History Narrative   Not on file   Social Determinants of Health   Financial Resource Strain: Not on file  Food Insecurity: Not on file  Transportation Needs: Not on file  Physical Activity: Not on file  Stress: Not on file  Social Connections: Not on file  Intimate Partner Violence: Not on file  :  Review of Systems  Constitutional: Negative.   HENT: Negative.    Eyes: Negative.   Respiratory: Negative.     Cardiovascular: Negative.   Gastrointestinal: Negative.   Genitourinary: Negative.   Musculoskeletal: Negative.   Skin: Negative.   Neurological:  Positive for dizziness and headaches.  Endo/Heme/Allergies: Negative.   Psychiatric/Behavioral: Negative.      Exam: @IPVITALS @ This is a well-developed and well-nourished white male in no obvious distress.  Vital signs show temperature of 97.9.  Pulse 89.  Blood pressure 123/80.  Weight is 286 pounds.  Head and neck exam shows no ocular or oral lesions.  Pupils react appropriately.  He has no adenopathy in the neck or supraclavicular region.  Lungs are clear bilaterally.  He has good air movement bilaterally.  Cardiac exam regular rate and rhythm with no murmurs, rubs or bruits.  Abdomen is soft.  He is obese.  He has good bowel sounds.  There is no fluid wave.  There is no palpable liver or spleen tip.  Back exam shows no tenderness over the spine, ribs or hips.  Extremities shows no clubbing, cyanosis or edema.  Has good strength in upper and lower extremities.  Neurological exam shows no obvious neurological deficits.  Skin exam shows some hyperpigmented lesions on his skin.  I see nothing that is suspicious.   Recent Labs    05/27/21 1104  WBC 11.8*  HGB 14.7  HCT 43.4  PLT 257    Recent Labs    05/27/21 1104  NA 137  K 3.5  CL 100  CO2 28  GLUCOSE 111*  BUN 15  CREATININE 1.24  CALCIUM 9.5    Blood smear review: None  Pathology: See above    Assessment and Plan: Kevin Hunter is a really nice 55 year old white male.  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  However, he has stage IV by virtue of the brain metastasis.  I see no other disease elsewhere..  We have to get a PET scan on him.  I think this will help [place] decide how we can treat him systemically.  He clearly needs to have the CNS lesions treated.  I think that radiosurgery would be the best way to go.  As such, we will speak with Dr. Sondra Come of  Radiation Oncology and have him referred for radiosurgery.  I do not see any reason why cannot have him radiosurgery.  I need to get him on Decadron.  I think this will be helpful.  We will put him on 4 mg twice a day.  I told to make sure he takes the Decadron with food.  Again systemically, we will have to see what we have with our molecular markers.  I suspect that would probably go to use a combination of chemotherapy and immunotherapy.  We then may consider radiation therapy for his residual disease.  I know this is somewhat controversial.  However, I really think we can be aggressive with Kevin Hunter since he looks so good.  He has a fantastic performance status.  If somebody just came up to him they would never know he had cancer.  We will treat the brain metastasis first.  We will then treat him systemically.  I had a wonderful time talking with Kevin Hunter.  He is incredibly interesting.  I still feel that we can do a lot of good with him.

## 2021-05-27 NOTE — Progress Notes (Signed)
Initial RN Navigator Patient Visit  Name: Kevin Hunter Date of Referral : Hospital Follow Up Diagnosis: Lung Cancer  Met with patient prior to their visit with MD. [name] patient "Your Patient Navigator" handout which explains my role, areas in which I am able to help, and all the contact information for myself and the office. Also gave patient MD and Navigator business card. Reviewed with patient the general overview of expected course after initial diagnosis and time frame for all steps to be completed.  New patient packet given to patient which includes: orientation to office and staff; campus directory; education on My Chart and Advance Directives; and patient centered education on lung cancer.  Patient lives by himself but has supportive friends and family in the area. He works from home and has a flexible schedule.   There was a desire to send [facility] however there is limited tissue. Per pathology "There is likely insufficient cellularity in the cellblock for additional testing." Tempus drawn and sent today.   He sees Dr Lisbeth Renshaw of [facility] tomorrow.   Patient completed visit with Dr. Marin Olp  Patient needs a PET scan which has already been scheduled for 06/09/2021. His SRS has already been scheduled and Dr Marin Olp plans to see him back after this and other workup is completed.   Patient understands all follow up procedures and expectations. They have my number to reach out for any further clarification or additional needs.    Oncology Nurse Navigator Documentation  Oncology Nurse Navigator Flowsheets 05/27/2021  Abnormal Finding Date -  Diagnosis Status -  Navigator Follow Up Date: -  Navigator Follow Up Reason: -  Navigator Location [facility]  Referral Date to RadOnc/MedOnc -  Navigator Encounter Type Initial MedOnc  Patient Visit Type MedOnc  Treatment Phase Pre-Tx/Tx Discussion  Barriers/Navigation Needs Coordination of Care;Education  Education Newly Diagnosed  Cancer Education;Pain/ Symptom Management  Interventions Education;Psycho-Social Support;Coordination of Care  Acuity Level 2-Minimal Needs (1-2 Barriers Identified)  Coordination of Care Pathology  Education Method Verbal;Written  Support Groups/Services Friends and Family  Time Spent with Patient 84

## 2021-05-28 ENCOUNTER — Other Ambulatory Visit: Payer: Self-pay

## 2021-05-28 ENCOUNTER — Ambulatory Visit
Admission: RE | Admit: 2021-05-28 | Discharge: 2021-05-28 | Disposition: A | Discharge status: Home / Self Care | Payer: Managed Care, Other (non HMO) | Source: Ambulatory Visit | Attending: Radiation Oncology | Admitting: Radiation Oncology

## 2021-05-28 ENCOUNTER — Encounter: Payer: Self-pay | Admitting: Radiation Oncology

## 2021-05-28 ENCOUNTER — Telehealth: Payer: Self-pay | Admitting: Hematology & Oncology

## 2021-05-28 VITALS — BP 121/81 | HR 96 | Temp 97.7°F | Resp 20 | Wt 290.2 lb

## 2021-05-28 DIAGNOSIS — C3411 Malignant neoplasm of upper lobe, right bronchus or lung: Secondary | ICD-10-CM | POA: Insufficient documentation

## 2021-05-28 DIAGNOSIS — I700 Atherosclerosis of aorta: Secondary | ICD-10-CM | POA: Diagnosis not present

## 2021-05-28 DIAGNOSIS — Z803 Family history of malignant neoplasm of breast: Secondary | ICD-10-CM | POA: Insufficient documentation

## 2021-05-28 DIAGNOSIS — J432 Centrilobular emphysema: Secondary | ICD-10-CM | POA: Insufficient documentation

## 2021-05-28 DIAGNOSIS — G936 Cerebral edema: Secondary | ICD-10-CM | POA: Diagnosis not present

## 2021-05-28 DIAGNOSIS — G4730 Sleep apnea, unspecified: Secondary | ICD-10-CM | POA: Diagnosis not present

## 2021-05-28 DIAGNOSIS — Z7952 Long term (current) use of systemic steroids: Secondary | ICD-10-CM | POA: Diagnosis not present

## 2021-05-28 DIAGNOSIS — C7931 Secondary malignant neoplasm of brain: Secondary | ICD-10-CM | POA: Insufficient documentation

## 2021-05-28 DIAGNOSIS — F319 Bipolar disorder, unspecified: Secondary | ICD-10-CM | POA: Diagnosis not present

## 2021-05-28 DIAGNOSIS — C3491 Malignant neoplasm of unspecified part of right bronchus or lung: Secondary | ICD-10-CM

## 2021-05-28 DIAGNOSIS — Z801 Family history of malignant neoplasm of trachea, bronchus and lung: Secondary | ICD-10-CM | POA: Insufficient documentation

## 2021-05-28 DIAGNOSIS — Z87891 Personal history of nicotine dependence: Secondary | ICD-10-CM | POA: Diagnosis not present

## 2021-05-28 DIAGNOSIS — I10 Essential (primary) hypertension: Secondary | ICD-10-CM | POA: Insufficient documentation

## 2021-05-28 MED ORDER — DEXAMETHASONE 4 MG PO TABS: 4.0000 mg | ORAL_TABLET | Freq: Two times a day (BID) | ORAL | 0 refills | Status: DC

## 2021-05-28 NOTE — Telephone Encounter (Signed)
No los 10/12

## 2021-05-28 NOTE — Progress Notes (Signed)
Radiation Oncology         [phone]) [phone] ________________________________  Name: Kevin Hunter        MRN: [id number]  Date of Service: 05/28/2021 DOB: 1966/02/08  DQ:[id number], Elta Guadeloupe, PA-C     REFERRING PHYSICIAN: Dr. Marin Olp   DIAGNOSIS: The primary encounter diagnosis was Lung cancer, primary, with metastasis from lung to other site, right [facility]). A diagnosis of Metastasis to brain [facility]) was also pertinent to this visit.   HISTORY OF PRESENT ILLNESS: Kevin Hunter is a 55 y.o. male seen at the request of Dr. Marin Olp for a new diagnosis of stage IV lung cancer.  The patient had been experiencing a headache for about a month without other symptoms of nausea, visual changes, or movement disruption.  He did develop a little bit of weakness in the left lower extremity as well as some difficulty with cognitive recall such as with spelling.  He proceeded to the emergency room at [facility] on 05/20/2021 this showed a 1.5 cm hypodense lesion in the right frontal lobe with vasogenic edema and a similar appearing lesion in the left parietal lobe measuring 1.2 cm with edema there was mass-effect with edema slightly effacing the body of the right lateral ventricle but no midline shift.  A CT chest abdomen and pelvis that day showed a mass in the right lung apex measuring up to 6.8 cm abutting the lateral chest wall mildly enlarged right hilar and mediastinal nodes and an indeterminate right adrenal nodule.  Evidence of atherosclerosis and emphysematous changes were seen he had an MRI as well that day that showed a nodular irregular heterogeneously enhancing mass in both the right superior frontal gyrus measuring 2.1 cm and left posterior parietal lobe measuring 2 cm consistent with the findings on CT.  Faint hemosiderin associated with the left parietal lesion was identified and edema was still seen but stable.  Smaller 7 to 8 mm enhancing nodular lesions in the anterior left frontal lobe  corresponded to more subtle vasogenic edema no dural thickening was noted.  He was started on dexamethasone, and his current dose is 6 mg twice a day.  He underwent evaluation with pulmonary and bronchoscopy with Dr. Jamey Ripa on 05/22/2021, final pathology from that procedure revealed malignant cells consistent with non-small cell carcinoma in the right upper lobe brushings and fine-needle aspirate, both consistent with IHC for squamous cell carcinoma.  Insufficient cellularity in the cellblock was noted for any additional testing however.  He is scheduled to undergo a PET scan on 06/09/2021.  His case was discussed in multidisciplinary brain oncology conference, and he is seen today to discuss stereotactic radiosurgery.  PREVIOUS RADIATION THERAPY: No   PAST MEDICAL HISTORY:  Past Medical History:  Diagnosis Date   Anxiety    Bipolar 1 disorder ([place])    Goals of care, counseling/discussion 05/27/2021   Hypertension    Lung cancer, primary, with metastasis from lung to other site, right ([place]) 05/27/2021   Mood disorder ([place])    Sleep apnea        PAST SURGICAL HISTORY: Past Surgical History:  Procedure Laterality Date   BRONCHIAL BIOPSY  05/22/2021   Procedure: BRONCHIAL BIOPSIES;  Surgeon: Garner Nash, DO;  Location: [facility];  Service: Pulmonary;;   BRONCHIAL BRUSHINGS  05/22/2021   Procedure: BRONCHIAL BRUSHINGS;  Surgeon: Garner Nash, DO;  Location: [facility];  Service: Pulmonary;;   BRONCHIAL NEEDLE ASPIRATION BIOPSY  05/22/2021   Procedure: BRONCHIAL NEEDLE ASPIRATION BIOPSIES;  Surgeon: Garner Nash, DO;  Location: [facility];  Service: Pulmonary;;   VIDEO BRONCHOSCOPY WITH ENDOBRONCHIAL NAVIGATION N/A 05/22/2021   Procedure: VIDEO BRONCHOSCOPY WITH ENDOBRONCHIAL NAVIGATION;  Surgeon: Garner Nash, DO;  Location: [place];  Service: Pulmonary;  Laterality: N/A;   VIDEO BRONCHOSCOPY WITH RADIAL ENDOBRONCHIAL ULTRASOUND  05/22/2021   Procedure: RADIAL  ENDOBRONCHIAL ULTRASOUND;  Surgeon: Garner Nash, DO;  Location: [facility];  Service: Pulmonary;;     FAMILY HISTORY:  Family History  Problem Relation Age of Onset   Breast cancer Mother    Lung cancer Father      SOCIAL HISTORY:  reports that he quit smoking about 3 years ago. His smoking use included cigarettes and e-cigarettes. He started smoking about 3 years ago. He has never used smokeless tobacco. He reports that he does not drink alcohol and does not use drugs.  The patient is single and lives in [place].  He works for a company that [employer]. 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    ALLERGIES: Penicillins   MEDICATIONS:  Current Outpatient Medications  Medication Sig Dispense Refill   amLODipine (NORVASC) 5 MG tablet Take 5 mg by mouth at bedtime.     clonazePAM (KLONOPIN) 1 MG tablet Take 1 mg by mouth 2 (two) times daily as needed for anxiety (May take additional 0.5mg  as needed for panic attack).     dexamethasone (DECADRON) 4 MG tablet Take 1 tablet (4 mg total) by mouth 2 (two) times daily. 60 tablet 0   HYDROcodone-acetaminophen (NORCO/VICODIN) 5-325 MG tablet Take 1-2 tablets by mouth every 4 (four) hours as needed for up to 5 days for severe pain. 30 tablet 0   ibuprofen (ADVIL) 200 MG tablet Take 800 mg by mouth every 6 (six) hours as needed for headache.     pseudoephedrine (SUDAFED) 30 MG tablet Take 60 mg by mouth every 4 (four) hours as needed (headache).     psyllium (METAMUCIL) 58.6 % packet Take 1 packet by mouth daily.     QUEtiapine (SEROQUEL XR) 400 MG 24 hr tablet Take 400 mg by mouth at bedtime.     tolterodine (DETROL LA) 4 MG 24 hr capsule Take 4 mg by mouth at bedtime.     traZODone (DESYREL) 100 MG tablet Take 200 mg by mouth at bedtime.     venlafaxine (EFFEXOR) 75 MG tablet Take 150 mg by mouth at bedtime.     No current facility-administered medications for this  encounter.     REVIEW OF SYSTEMS: On review of systems, the patient reports that he is still having dizziness and headaches. He has not started his steroid prescription yet.  He does have some word finding difficulties and sometimes it takes him longer to spell words correctly. He describes but reports his pain has not been to the point of needing narcotics.  He notes that his gait is somewhat unstable at times and sometimes some difficulty with completing text messages based on taking the text.  He has had some occasional numbness in his right ankle confusion and memory loss.  He denies any chest pain unintended weight changes, shortness of breath and denies hemoptysis.      PHYSICAL EXAM:  Wt Readings from Last 3 Encounters:  05/28/21 290 lb 3.2 oz (131.6 kg)  05/27/21 286 lb (129.7 kg)  05/23/21 283 lb 12.8 oz (128.7 kg)   Temp Readings from Last 3 Encounters:  05/28/21 97.7 F (36.5 C)  05/27/21 97.9 F (36.6 C) (Oral)  05/23/21 97.8 F (36.6 C) (Oral)   BP Readings from Last 3 Encounters:  05/28/21 121/81  05/27/21 123/80  05/23/21 (!) 132/91   Pulse Readings from Last 3 Encounters:  05/28/21 96  05/27/21 89  05/23/21 70   Pain Assessment Pain Score: 3  Pain Loc: Head (Right frontal headache)/10  In general this is a well appearing male who is younger in appearance than his stated age in no acute distress. He's alert and oriented x4 and appropriate throughout the examination. Cardiopulmonary assessment is negative for acute distress and he exhibits normal effort.     ECOG = 1  0 - Asymptomatic (Fully active, able to carry on all predisease activities without restriction)  1 - Symptomatic but completely ambulatory (Restricted in physically strenuous activity but ambulatory and able to carry out work of a light or sedentary nature. For example, light housework, office work)  2 - Symptomatic, <50% in bed during the day (Ambulatory and capable of all self care but  unable to carry out any work activities. Up and about more than 50% of waking hours)  3 - Symptomatic, >50% in bed, but not bedbound (Capable of only limited self-care, confined to bed or chair 50% or more of waking hours)  4 - Bedbound (Completely disabled. Cannot carry on any self-care. Totally confined to bed or chair)  5 - Death   Eustace Pen MM, Creech RH, Tormey DC, et al. [phone]). "Toxicity and response criteria of the [facility]". [place] Oncol. 5 (6): 649-55    LABORATORY DATA:  Lab Results  Component Value Date   WBC 11.8 (H) 05/27/2021   HGB 14.7 05/27/2021   HCT 43.4 05/27/2021   MCV 87.5 05/27/2021   PLT 257 05/27/2021   Lab Results  Component Value Date   NA 137 05/27/2021   K 3.5 05/27/2021   CL 100 05/27/2021   CO2 28 05/27/2021   Lab Results  Component Value Date   ALT 17 05/27/2021   AST 13 (L) 05/27/2021   ALKPHOS 56 05/27/2021   BILITOT 0.4 05/27/2021      RADIOGRAPHY: CT HEAD WO CONTRAST (5MM)  Result Date: 05/20/2021 CLINICAL DATA:  Three weeks of headache, dizziness, trouble writing and typing EXAM: CT HEAD WITHOUT CONTRAST TECHNIQUE: Contiguous axial images were obtained from the base of the skull through the vertex without intravenous contrast. COMPARISON:  None. FINDINGS: Brain: There is a 1.5 cm centrally hypodense lesion in the right frontal lobe with significant surrounding vasogenic edema with sparing of the overlying white matter. There is a similar appearing lesion in the left parietal lobe measuring 1.2 cm with surrounding edema. There is mild vasogenic edema in the left inferior frontal gyrus (4-60). There is no evidence of acute intracranial hemorrhage or infarct. There is no extra-axial fluid collection Mass effect from the above described edema slightly effaces the body of the right lateral ventricle. There is no midline shift. Vascular: No hyperdense vessel or unexpected calcification. Skull: Normal. Negative for  fracture or focal lesion. Sinuses/Orbits: The imaged paranasal sinuses are clear. The globes and orbits are unremarkable. Other: None. IMPRESSION: Discrete parenchymal lesions in the right frontal and left parietal lobes with marked surrounding vasogenic edema described above most suspicious for metastases. Mild vasogenic edema in the left inferior frontal gyrus raises suspicion for an additional lesion in this location. Brain MRI with and without contrast is recommended for further characterization.  Electronically Signed   By: Valetta Mole M.D.   On: 05/20/2021 13:49   MR Brain W and Wo Contrast  Result Date: 05/21/2021 CLINICAL DATA:  55 year old male who presented with headache, dizziness and abnormal head CT revealing multifocal vasogenic looking cerebral edema. Subsequent CT Chest, Abdomen, and Pelvis demonstrates a large right upper lung mass. Staging. EXAM: MRI HEAD WITHOUT AND WITH CONTRAST TECHNIQUE: Multiplanar, multiecho pulse sequences of the brain and surrounding structures were obtained without and with intravenous contrast. CONTRAST:  65mL GADAVIST GADOBUTROL 1 MMOL/ML IV SOLN COMPARISON:  Head CT without contrast 05/20/2021. FINDINGS: Brain: Nodular, irregular heterogeneously enhancing masses in the right superior frontal gyrus (21 mm series 20, image 48) and left posterior parietal lobe (20 mm image 38) correspond to the areas of prominent vasogenic edema seen by CT. Faint hemosiderin associated with the left parietal lesion. Edema is stable since yesterday. Mild regional mass effect, but no midline shift. Smaller 7-8 mm enhancing nodular lesion in the anterior left frontal lobe corresponds to a more subtle area vasogenic edema there (series 20, image 36). No other abnormal brain enhancement identified. No dural thickening. No superimposed restricted diffusion to suggest acute infarction. No midline shift, ventriculomegaly, extra-axial collection or acute intracranial hemorrhage.  Cervicomedullary junction and pituitary are within normal limits. Outside of the areas of edema, gray and white matter signal is within normal limits. No cortical encephalomalacia or other chronic cerebral blood products. Vascular: Major intracranial vascular flow voids are preserved, the left vertebral artery appears dominant. Following contrast the major dural venous sinuses are enhancing and appear to be patent. Skull and upper cervical spine: Heterogeneous decreased T1 marrow signal in the skull and upper cervical spine. But no destructive osseous lesion identified. Negative visible cervical spinal cord. Sinuses/Orbits: Negative. Other: Mastoids are clear. Visible internal auditory structures appear normal. Negative visible + scalp and face. IMPRESSION: 1. Three enhancing brain metastases, ranging from 7-8 mm (anterior left frontal lobe) to 21 mm (right superior frontal gyrus). Associated vasogenic edema is stable from the CT yesterday. No midline shift or impending herniation. 2. Nonspecific decreased T1 marrow signal in the skull and upper cervical spine, but no destructive osseous lesion to strongly indicate osseous metastases. Electronically Signed   By: Genevie Ann M.D.   On: 05/21/2021 08:32   CT CHEST ABDOMEN PELVIS W CONTRAST  Result Date: 05/20/2021 CLINICAL DATA:  Evaluate for metastatic disease EXAM: CT CHEST, ABDOMEN, AND PELVIS WITH CONTRAST TECHNIQUE: Multidetector CT imaging of the chest, abdomen and pelvis was performed following the standard protocol during bolus administration of intravenous contrast. CONTRAST:  126mL OMNIPAQUE IOHEXOL 300 MG/ML  SOLN COMPARISON:  None. FINDINGS: CT CHEST FINDINGS Cardiovascular: Normal heart size. No pericardial effusion. No significant coronary artery calcifications. No significant atherosclerotic disease of the thoracic aorta. Mediastinum/Nodes: Esophagus and thyroid are unremarkable. Enlarged right hilar lymph node measuring 1.3 cm in short axis on series  2, image 27. Enlarged right lower paratracheal lymph node measuring 1.4 cm short axis on series 2, image 23. Lungs/Pleura: Central airways are patent upper lobe predominant centrilobular and paraseptal emphysema. Mass of the right lung apex measuring 6.8 x 4.2 cm which abuts the lateral chest wall. Musculoskeletal: No chest wall mass or suspicious bone lesions identified. CT ABDOMEN PELVIS FINDINGS Hepatobiliary: No focal liver abnormality is seen. No gallstones, gallbladder wall thickening, or biliary dilatation. Pancreas: Unremarkable. No pancreatic ductal dilatation or surrounding inflammatory changes. Spleen: Normal in size without focal abnormality. Adrenals/Urinary Tract: Indeterminate right adrenal nodule measuring 2.7  cm. Kidneys enhance symmetrically with no evidence of hydronephrosis or nephrolithiasis. Bladder is unremarkable. Stomach/Bowel: Stomach is within normal limits. Appendix appears normal. No evidence of bowel wall thickening, distention, or inflammatory changes. Vascular/Lymphatic: Aortic atherosclerosis. No enlarged abdominal or pelvic lymph nodes. Reproductive: Prostate is unremarkable. Other: No abdominal wall hernia or abnormality. No abdominopelvic ascites. Musculoskeletal: T8-T9 endplate lucencies, likely due to Schmorl's nodes. No aggressive appearing osseous lesions. IMPRESSION: Mass of the right lung apex measuring 6.8 x 4.2 cm which abuts the lateral chest wall, favor primary lung malignancy. Mildly enlarged right hilar and mediastinal lymph nodes, concerning for metastatic disease. Indeterminate right adrenal nodule, differential includes metastatic disease versus a lipid poor adenoma. Aortic Atherosclerosis (ICD10-I70.0) and Emphysema (ICD10-J43.9). Electronically Signed   By: Yetta Glassman M.D.   On: 05/20/2021 15:29   DG CHEST PORT 1 VIEW  Result Date: 05/22/2021 CLINICAL DATA:  Status post bronchoscopy with biopsy. EXAM: PORTABLE CHEST 1 VIEW COMPARISON:  CT chest abdomen  pelvis 05/20/2021 FINDINGS: Of note, the patient is mildly rotated on this portable examination. Nodular opacity at the right apex corresponds to the pulmonary mass seen on CT 05/20/2021. Bibasilar subsegmental atelectasis. No pleural effusion or pneumothorax. Heart is normal in size, accounting for patient positioning. Visualized skeletal structures are unremarkable. IMPRESSION: Right apical lung mass.  Bibasilar atelectasis.  No pneumothorax. Electronically Signed   By: Ileana Roup M.D.   On: 05/22/2021 11:12   DG C-ARM BRONCHOSCOPY  Result Date: 05/22/2021 C-ARM BRONCHOSCOPY: Fluoroscopy was utilized by the requesting physician.  No radiographic interpretation.       IMPRESSION/PLAN: 1. 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 Dr. Lisbeth Renshaw discusses the pathology findings and reviews the nature of stage IV disease with brian involvement. He currently appears to be a good candidate for stereotactic radiosurgery  [facility]) to the brain. We discussed the rationale to complete his work up with PET scan and the rationale for 3T MRI of the brain for [facility] planning. He would meet with neurosurgery and we explained Dr. Rubbie Battiest role in this therapy. We discussed the risks, benefits, short, and long term effects of radiotherapy, as well as the curative intent, and the patient is interested in proceeding. Dr. Lisbeth Renshaw discusses the delivery and logistics of radiotherapy and anticipates a course of 1 or possibly up to 3 fractions of radiotherapy, but hopefully just one. Written consent is obtained and placed in the chart, a copy was provided to the patient. He will simulate on 06/05/21. He will also continue with plans to proceed with systemic therapy with Dr. Marin Olp.  2. Cerebral edema leading to symptoms from #1.  The patient has not yet started his dexamethasone.  We discussed changing the dose to 4 mg twice daily and to load  with 8 mg when he receives the prescription and  then still 1 tablet tonight, then BID starting tomorrow.  I encouraged him to also take over-the-counter oral PPI therapy and have written a note to pharmacy to remind him of this as well.   In a visit lasting 60 minutes, greater than 50% of the time was spent face to face discussing the patient's condition, in preparation for the discussion, and coordinating the patient's care.   The above documentation reflects my direct findings during this shared patient visit. Please see the separate note by Dr. Lisbeth Renshaw on this date for the remainder of the patient's plan of care.    Carola Rhine, [facility]   **Disclaimer: This  note was dictated with voice recognition software. Similar sounding words can inadvertently be transcribed and this note may contain transcription errors which may not have been corrected upon publication of note.**

## 2021-05-29 ENCOUNTER — Encounter: Payer: Self-pay | Admitting: *Deleted

## 2021-05-29 ENCOUNTER — Encounter: Payer: Self-pay | Admitting: General Practice

## 2021-05-29 NOTE — Progress Notes (Signed)
[place] Psychosocial Distress Screening Clinical Social Work  Clinical Social Work was referred by distress screening protocol.  The patient scored a 7 on the Psychosocial Distress Thermometer which indicates moderate distress. Clinical Social Worker contacted patient by phone to assess for distress and other psychosocial needs. No answer, left generic VM w my contact information and encouragement to call back at his convenience.   ONCBCN DISTRESS SCREENING 05/28/2021  Screening Type Initial Screening  Distress experienced in past week (1-10) 7  Practical problem type Work/school  Emotional problem type Depression;Nervousness/Anxiety;Adjusting to illness;Boredom  Spiritual/Religous concerns type Facing my mortality  Physical Problem type Pain;Getting around;Bathing/dressing;Changes in urination  Other Contact via email    Clinical Social Worker follow up needed: Yes.    If yes, follow up plan:  call again  Beverely Pace, [place], [place] Worker Phone:  [phone]

## 2021-05-29 NOTE — Progress Notes (Signed)
Patient was seen by RadOnc and has a plan to start radiation on 06/12/21. Follow up with Dr Marin Olp scheduled for 06/15/2021.  Called patient and made him aware of follow up appointment, time, date and location.   Oncology Nurse Navigator Documentation  Oncology Nurse Navigator Flowsheets 05/29/2021  Abnormal Finding Date -  Diagnosis Status -  Planned Course of Treatment Radiation  Phase of Treatment Radiation  Navigator Follow Up Date: 06/09/2021  Navigator Follow Up Reason: Scan Review  Navigator Location [facility]  Referral Date to RadOnc/MedOnc -  Navigator Encounter Type Appt/Treatment Plan Review;Telephone  Telephone Appt Confirmation/Clarification;Education;Outgoing Call  Patient Visit Type MedOnc  Treatment Phase Pre-Tx/Tx Discussion  Barriers/Navigation Needs Coordination of Care;Education  Education Other  Interventions Coordination of Care;Education;Psycho-Social Support  Acuity Level 2-Minimal Needs (1-2 Barriers Identified)  Coordination of Care Appts  Education Method Verbal  Support Groups/Services Friends and Family  Time Spent with Patient 30

## 2021-06-01 ENCOUNTER — Encounter: Payer: Self-pay | Admitting: *Deleted

## 2021-06-01 NOTE — Progress Notes (Signed)
[place] Psychosocial Distress Screening Clinical Social Work  Clinical Social Work was referred by distress screening protocol.  The patient scored a 7 on the Psychosocial Distress Thermometer which indicates moderate distress. Clinical Social Worker contacted patient by phone to assess for distress and other psychosocial needs.  Patient described his experience as stressful, and stated he was feeling "ok" overall.  CSW provided education on CSW role and the support team at [facility]  Patient requested to be added to the [facility] support program mailing list.  CSW and patient discussed the importance of support and patient identified his mother and brother as positive support.  Patient is currently working remotely and plans to continue working as long as possible.  Patient stated he has disability benefits through his employer.  CSW and patient discussed the servant center and support for SSDi.  Patient was appreciative of information and contact.  CSW encouraged patient to call with questions or concerns.     ONCBCN DISTRESS SCREENING 05/28/2021  Screening Type Initial Screening  Distress experienced in past week (1-10) 7  Practical problem type Work/school  Emotional problem type Depression;Nervousness/Anxiety;Adjusting to illness;Boredom  Spiritual/Religous concerns type Facing my mortality  Physical Problem type Pain;Getting around;Bathing/dressing;Changes in urination  Other Contact via email    Johnnye Lana, MSW, LCSW, OSW-C Clinical Social Worker [facility] [phone]

## 2021-06-04 ENCOUNTER — Ambulatory Visit
Admission: RE | Admit: 2021-06-04 | Discharge: 2021-06-04 | Disposition: A | Discharge status: Home / Self Care | Payer: Managed Care, Other (non HMO) | Source: Ambulatory Visit | Attending: Radiation Oncology | Admitting: Radiation Oncology

## 2021-06-04 ENCOUNTER — Other Ambulatory Visit: Payer: Self-pay

## 2021-06-04 DIAGNOSIS — C7931 Secondary malignant neoplasm of brain: Secondary | ICD-10-CM

## 2021-06-04 MED ORDER — GADOBENATE DIMEGLUMINE 529 MG/ML IV SOLN
20.0000 mL | Freq: Once | INTRAVENOUS | Status: AC | PRN
  Administered 2021-06-04: 20 mL via INTRAVENOUS

## 2021-06-05 ENCOUNTER — Ambulatory Visit
Admission: RE | Admit: 2021-06-05 | Discharge: 2021-06-05 | Disposition: A | Discharge status: Home / Self Care | Payer: Managed Care, Other (non HMO) | Source: Ambulatory Visit | Attending: Radiation Oncology | Admitting: Radiation Oncology

## 2021-06-05 VITALS — BP 139/96 | HR 63 | Temp 97.7°F | Resp 20 | Wt 294.0 lb

## 2021-06-05 DIAGNOSIS — C3490 Malignant neoplasm of unspecified part of unspecified bronchus or lung: Secondary | ICD-10-CM | POA: Diagnosis present

## 2021-06-05 DIAGNOSIS — C7931 Secondary malignant neoplasm of brain: Secondary | ICD-10-CM | POA: Diagnosis present

## 2021-06-05 DIAGNOSIS — C3491 Malignant neoplasm of unspecified part of right bronchus or lung: Secondary | ICD-10-CM

## 2021-06-05 DIAGNOSIS — Z510 Encounter for antineoplastic radiation therapy: Secondary | ICD-10-CM | POA: Insufficient documentation

## 2021-06-05 MED ORDER — SODIUM CHLORIDE 0.9% FLUSH
10.0000 mL | Freq: Once | INTRAVENOUS | Status: AC
  Administered 2021-06-05: 10 mL via INTRAVENOUS

## 2021-06-05 NOTE — Progress Notes (Signed)
Has armband been applied?    Does patient have an allergy to IV contrast dye?: No   Has patient ever received premedication for IV contrast dye?: N/a   Does patient take metformin?: No  If patient does take metformin when was the last dose: n/a   Date of lab work: 05/27/2021 BUN: 15 CR: 1.24 eGfr: >60  IV site: RT antecubital  Has IV site been added to flowsheet?  YES. 1 successful attempt by Leandra Kern, LPN

## 2021-06-09 ENCOUNTER — Ambulatory Visit (HOSPITAL_COMMUNITY)
Admission: RE | Admit: 2021-06-09 | Discharge: 2021-06-09 | Disposition: A | Discharge status: Home / Self Care | Payer: Managed Care, Other (non HMO) | Source: Ambulatory Visit | Attending: Hematology & Oncology | Admitting: Hematology & Oncology

## 2021-06-09 ENCOUNTER — Other Ambulatory Visit: Payer: Self-pay

## 2021-06-09 DIAGNOSIS — C3490 Malignant neoplasm of unspecified part of unspecified bronchus or lung: Secondary | ICD-10-CM | POA: Insufficient documentation

## 2021-06-09 LAB — GLUCOSE, CAPILLARY: Glucose-Capillary: 92 mg/dL (ref 70–99)

## 2021-06-09 MED ORDER — FLUDEOXYGLUCOSE F - 18 (FDG) INJECTION
14.7000 | Freq: Once | INTRAVENOUS | Status: AC
  Administered 2021-06-09: 15.3 via INTRAVENOUS

## 2021-06-11 ENCOUNTER — Encounter: Payer: Self-pay | Admitting: *Deleted

## 2021-06-11 NOTE — Progress Notes (Signed)
Oncology Nurse Navigator Documentation  Oncology Nurse Navigator Flowsheets 06/11/2021  Abnormal Finding Date -  Diagnosis Status -  Planned Course of Treatment -  Phase of Treatment -  Navigator Follow Up Date: 06/15/2021  Navigator Follow Up Reason: Follow-up Appointment  Navigator Location [facility]  Referral Date to RadOnc/MedOnc -  Navigator Encounter Type Scan Review  Telephone -  Patient Visit Type MedOnc  Treatment Phase Pre-Tx/Tx Discussion  Barriers/Navigation Needs Coordination of Care;Education  Education -  Interventions None Required  Acuity Level 2-Minimal Needs (1-2 Barriers Identified)  Coordination of Care -  Education Method -  Support Groups/Services Friends and Family  Time Spent with Patient 15

## 2021-06-12 ENCOUNTER — Other Ambulatory Visit: Payer: Self-pay

## 2021-06-12 ENCOUNTER — Other Ambulatory Visit: Payer: Self-pay | Admitting: *Deleted

## 2021-06-12 ENCOUNTER — Ambulatory Visit
Admission: RE | Admit: 2021-06-12 | Discharge: 2021-06-12 | Disposition: A | Discharge status: Home / Self Care | Payer: Managed Care, Other (non HMO) | Source: Ambulatory Visit | Attending: Radiation Oncology | Admitting: Radiation Oncology

## 2021-06-12 ENCOUNTER — Encounter: Payer: Self-pay | Admitting: Radiation Oncology

## 2021-06-12 DIAGNOSIS — Z510 Encounter for antineoplastic radiation therapy: Secondary | ICD-10-CM | POA: Diagnosis not present

## 2021-06-12 DIAGNOSIS — C3490 Malignant neoplasm of unspecified part of unspecified bronchus or lung: Secondary | ICD-10-CM

## 2021-06-12 NOTE — Progress Notes (Signed)
  Radiation Oncology         [phone] ________________________________  Name: Kevin Hunter  YFV:[id number]  Date of Service: 06/12/21  DOB: 02-Apr-1966   Steroid Taper Instructions   You currently have a prescription for Dexamethasone 4 mg Tablets.    Beginning 06/22/21: Take 1/2 of a tablet (which is 2 mg) twice a day  Beginning 06/29/21: Take 1/2 of a tablet (which is 2 mg) once a day  Beginning 07/06/21: Take 1/2 of a tablet (which is 2 mg) every other day and stop on 07/15/21.   Please call our office if you have any headaches, visual changes, uncontrolled movements, nausea or vomiting.

## 2021-06-12 NOTE — Progress Notes (Signed)
Kevin Hunter rested with [place] for 30 minutes following his SRS treatment.  Patient denies headache, dizziness, nausea, diplopia or ringing in the ears. Denies fatigue. Patient without complaints. Understands to avoid strenuous activity for the next 24 hours and call [phone] with needs.  He was given steroid taper instructions.  He verbalized understanding of these instructions and knows to call with any rebound symptoms.  BP 138/90   Pulse 63   Temp (!) 96.9 F (36.1 C)   Resp 18   SpO2 99%    Joyleen Haselton M. Leonie Green, BSN

## 2021-06-15 ENCOUNTER — Inpatient Hospital Stay: Payer: Managed Care, Other (non HMO)

## 2021-06-15 ENCOUNTER — Other Ambulatory Visit: Payer: Self-pay

## 2021-06-15 ENCOUNTER — Encounter: Payer: Self-pay | Admitting: Hematology & Oncology

## 2021-06-15 ENCOUNTER — Inpatient Hospital Stay (HOSPITAL_BASED_OUTPATIENT_CLINIC_OR_DEPARTMENT_OTHER): Payer: Managed Care, Other (non HMO) | Admitting: Hematology & Oncology

## 2021-06-15 VITALS — BP 133/82 | HR 64 | Temp 98.4°F | Wt 295.0 lb

## 2021-06-15 DIAGNOSIS — C3491 Malignant neoplasm of unspecified part of right bronchus or lung: Secondary | ICD-10-CM

## 2021-06-15 DIAGNOSIS — I10 Essential (primary) hypertension: Secondary | ICD-10-CM | POA: Diagnosis not present

## 2021-06-15 DIAGNOSIS — C3490 Malignant neoplasm of unspecified part of unspecified bronchus or lung: Secondary | ICD-10-CM

## 2021-06-15 DIAGNOSIS — C3411 Malignant neoplasm of upper lobe, right bronchus or lung: Secondary | ICD-10-CM | POA: Diagnosis not present

## 2021-06-15 LAB — COMPREHENSIVE METABOLIC PANEL
ALT: 31 U/L (ref 0–44)
AST: 19 U/L (ref 15–41)
Albumin: 4 g/dL (ref 3.5–5.0)
Alkaline Phosphatase: 75 U/L (ref 38–126)
Anion gap: 7 (ref 5–15)
BUN: 15 mg/dL (ref 6–20)
CO2: 31 mmol/L (ref 22–32)
Calcium: 9.5 mg/dL (ref 8.9–10.3)
Chloride: 100 mmol/L (ref 98–111)
Creatinine, Ser: 1.09 mg/dL (ref 0.61–1.24)
GFR, Estimated: >60 mL/min (ref >60)
Glucose, Bld: 71 mg/dL (ref 70–99)
Potassium: 4.4 mmol/L (ref 3.5–5.1)
Sodium: 138 mmol/L (ref 135–145)
Total Bilirubin: 0.4 mg/dL (ref 0.3–1.2)
Total Protein: 6.9 g/dL (ref 6.5–8.1)

## 2021-06-15 LAB — CBC WITH DIFFERENTIAL (CANCER CENTER ONLY)
Abs Immature Granulocytes: 0.19 K/uL — ABNORMAL HIGH (ref 0.00–0.07)
Basophils Absolute: 0 K/uL (ref 0.0–0.1)
Basophils Relative: 0 %
Eosinophils Absolute: 0 K/uL (ref 0.0–0.5)
Eosinophils Relative: 0 %
HCT: 47 % (ref 39.0–52.0)
Hemoglobin: 15.9 g/dL (ref 13.0–17.0)
Immature Granulocytes: 1 %
Lymphocytes Relative: 7 %
Lymphs Abs: 1.1 K/uL (ref 0.7–4.0)
MCH: 30.1 pg (ref 26.0–34.0)
MCHC: 33.8 g/dL (ref 30.0–36.0)
MCV: 89 fL (ref 80.0–100.0)
Monocytes Absolute: 1.3 K/uL — ABNORMAL HIGH (ref 0.1–1.0)
Monocytes Relative: 8 %
Neutro Abs: 13.9 K/uL — ABNORMAL HIGH (ref 1.7–7.7)
Neutrophils Relative %: 84 %
Platelet Count: 230 K/uL (ref 150–400)
RBC: 5.28 MIL/uL (ref 4.22–5.81)
RDW: 15.8 % — ABNORMAL HIGH (ref 11.5–15.5)
WBC Count: 16.5 K/uL — ABNORMAL HIGH (ref 4.0–10.5)
nRBC: 0 % (ref 0.0–0.2)

## 2021-06-15 LAB — LACTATE DEHYDROGENASE: LDH: 208 U/L — ABNORMAL HIGH (ref 98–192)

## 2021-06-15 NOTE — Op Note (Addendum)
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 Lisbeth Renshaw  TECHNIQUE:  The patient underwent a radiation treatment planning session in the radiation oncology simulation suite under the care of the radiation oncology physician and physicist.  I participated closely in the radiation treatment planning afterwards. The patient underwent planning CT which was fused to 3T high resolution MRI with 1 mm axial slices.  These images were fused on the planning system.  We contoured the gross target volumes and subsequently expanded this to yield the Planning Target Volume. I actively participated in the planning process.  I helped to define and review the target contours and also the contours of the optic pathway, eyes, brainstem and selected nearby organs at risk.  All the dose constraints for critical structures were reviewed and compared to AAPM Task Group 101.  The prescription dose conformity was reviewed.  I approved the plan electronically.    Accordingly, [name]  was brought to the TrueBeam stereotactic radiation treatment linac and placed in the custom immobilization mask.  The patient was aligned according to the IR fiducial markers with BrainLab Exactrac, then orthogonal x-rays were used in ExacTrac with the 6DOF robotic table and the shifts were made to align the patient.  [name] received stereotactic radiosurgery to a prescription dose of 20Gy to the right frontal lesion, 20Gy to the left frontal and left parietal lesions.    The detailed description of the procedure is recorded in the radiation oncology procedure note.  I was present for the duration of the  procedure.  DISPOSITION:   Following delivery, the patient was transported to nursing in stable condition and monitored for possible acute effects to be discharged to home in stable condition with follow-up in one month.  Elwin Sleight, [facility]

## 2021-06-15 NOTE — Progress Notes (Signed)
Hematology and Oncology Follow Up Visit  Kevin Hunter [id number] [birth year] 55 y.o. 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  He looks great.  He just completed his radiosurgery for the brain mets.  He said everything was done in 1 day.  We did get a PET scan on him.  This was done on 06/13/2019 who.  The PET scan showed that he had metastatic disease to the mediastinal and hilar lymph nodes on the right side.  He had metastatic disease to some retroperitoneal lymph nodes, right axillary lymph node and adrenal gland.  He looks great.  We can clearly be aggressive with his therapy.  He has had no abdominal pain.  He has had a little bit of a headache from the radiosurgery.  He is on a steroid taper which she will finish I think next week.  There is been no problems with his appetite.  He has had no mouth sores.  He has had no visual changes.  He has had no weakness.  There is been no dizziness.  He has had no change in bowel or bladder habits.  There has been no rashes.  He has had no issues with nausea or vomiting.  I would have to say that overall, his performance status is probably ECOG 0.  Medications:  Current Outpatient Medications:    amLODipine (NORVASC) 5 MG tablet, Take 5 mg by mouth at bedtime., Disp: , Rfl:    clonazePAM (KLONOPIN) 1 MG tablet, Take 1 mg by mouth 2 (two) times daily as needed for anxiety (May take additional 0.5mg  as needed for panic attack)., Disp: , Rfl:    dexamethasone (DECADRON) 4 MG tablet, Take 1 tablet (4 mg total) by mouth 2 (two) times daily., Disp: 60 tablet, Rfl: 0   ibuprofen (ADVIL) 200 MG tablet, Take 800 mg by mouth every 6 (six) hours as needed for headache., Disp: , Rfl:     levETIRAcetam (KEPPRA) 500 MG tablet, Take 500 mg by mouth 2 (two) times daily., Disp: , Rfl:    pseudoephedrine (SUDAFED) 30 MG tablet, Take 60 mg by mouth every 4 (four) hours as needed (headache)., Disp: , Rfl:    psyllium (METAMUCIL) 58.6 % packet, Take 1 packet by mouth daily., Disp: , Rfl:    QUEtiapine (SEROQUEL XR) 400 MG 24 hr tablet, Take 400 mg by mouth at bedtime., Disp: , Rfl:    tolterodine (DETROL LA) 4 MG 24 hr capsule, Take 4 mg by mouth at bedtime., Disp: , Rfl:    traZODone (DESYREL) 100 MG tablet, Take 200 mg by mouth at bedtime., Disp: , Rfl:    venlafaxine (EFFEXOR) 75 MG tablet, Take 150 mg by mouth at bedtime., Disp: , Rfl:   Allergies:  Allergies  Allergen Reactions   Penicillins Other (See Comments)    Unknown Reaction when he was an infant.    Past Medical History, Surgical history, Social history, and Family History were reviewed and updated.  Review of Systems: Review of Systems  Constitutional: Negative.   HENT:  Negative.    Eyes: Negative.   Respiratory: Negative.    Cardiovascular: Negative.   Gastrointestinal: Negative.   Endocrine: Negative.   Genitourinary: Negative.    Musculoskeletal: Negative.   Skin: Negative.  Neurological:  Positive for headaches.  Hematological: Negative.   Psychiatric/Behavioral: Negative.     Physical Exam:  weight is 295 lb (133.8 kg). His oral temperature is 98.4 F (36.9 C). His blood pressure is 133/82 and his pulse is 64. His oxygen saturation is 94%.   Wt Readings from Last 3 Encounters:  06/15/21 295 lb (133.8 kg)  06/05/21 294 lb (133.4 kg)  05/28/21 290 lb 3.2 oz (131.6 kg)    Physical Exam Vitals reviewed.  HENT:     Head: Normocephalic and atraumatic.  Eyes:     Pupils: Pupils are equal, round, and reactive to light.  Cardiovascular:     Rate and Rhythm: Normal rate and regular rhythm.     Heart sounds: Normal heart sounds.  Pulmonary:     Effort: Pulmonary effort is normal.     Breath  sounds: Normal breath sounds.  Abdominal:     General: Bowel sounds are normal.     Palpations: Abdomen is soft.  Musculoskeletal:        General: No tenderness or deformity. Normal range of motion.     Cervical back: Normal range of motion.  Lymphadenopathy:     Cervical: No cervical adenopathy.  Skin:    General: Skin is warm and dry.     Findings: No erythema or rash.  Neurological:     Mental Status: He is alert and oriented to person, place, and time.  Psychiatric:        Behavior: Behavior normal.        Thought Content: Thought content normal.        Judgment: Judgment normal.     Lab Results  Component Value Date   WBC 16.5 (H) 06/15/2021   HGB 15.9 06/15/2021   HCT 47.0 06/15/2021   MCV 89.0 06/15/2021   PLT 230 06/15/2021     Chemistry      Component Value Date/Time   NA 138 06/15/2021 1241   K 4.4 06/15/2021 1241   CL 100 06/15/2021 1241   CO2 31 06/15/2021 1241   BUN 15 06/15/2021 1241   CREATININE 1.09 06/15/2021 1241   CREATININE 1.24 05/27/2021 1104      Component Value Date/Time   CALCIUM 9.5 06/15/2021 1241   ALKPHOS 75 06/15/2021 1241   AST 19 06/15/2021 1241   AST 13 (L) 05/27/2021 1104   ALT 31 06/15/2021 1241   ALT 17 05/27/2021 1104   BILITOT 0.4 06/15/2021 1241   BILITOT 0.4 05/27/2021 1104     Impression and Plan: Kevin Hunter is a very nice 55 year old white male.  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  At this point, we will see about systemic therapy.  I talked him about a Port-A-Cath.  We will see about getting a Port-A-Cath placed this week.  We will try to get a liquid biopsy on him.  I would have to believe that he has tumor cells in his blood if he has metastatic disease to his abdomen.  Hopefully, we can get some molecular analysis from his blood work.  I do think that he would be a good candidate for combined immunotherapy and chemotherapy.  I think that the combination of  Taxol/carboplatinum/Opdivo/Yervoy would be a reasonable approach.  He is in great shape.  He has a great performance status.  I feel we can be aggressive.  I would probably give him 4 cycles of combined chemoimmunotherapy.  Afterwards, then we can consider maintenance therapy.  After his second cycle of treatment I would repeat his PET scan to see how everything looks.  He is post to have another MRI of the brain sometime in December to see how the radiosurgery worked for the brain mets.  I went over the protocol with his chemoimmunotherapy.  I gave him information sheets about each agent.  He will need to have Neulasta.  I explained to him the role of Neulasta to try to minimize his infection risk.  I told about the risk of diarrhea, hypothyroidism, skin rash, autoimmune hepatitis, from the immunotherapy.  I do think that he would have a good chance of responding to treatment.  We will try to get started with treatment next week.  I will plan to see him back when he has his second cycle of treatment.   [name], MD 10/31/20223:35 PM

## 2021-06-16 ENCOUNTER — Encounter: Payer: Self-pay | Admitting: *Deleted

## 2021-06-16 NOTE — Progress Notes (Signed)
Patient will start chemo next week. He needs a port, and chemo education scheduled. Currently chemo regimen is pending authorization.   Port scheduled for 06/24/2021 Chemo Education scheduled for 06/23/2021  Called and spoke with patient. Reviewed his two appointments with him including time, date and location. He knows that he must arrive at 12:30p for his port, be NPO after 8am and that he needs a driver with him. He is also aware that we will schedule his chemo once we receive authorization.   Encouraged him to call if he had any questions or concerns.   Oncology Nurse Navigator Documentation  Oncology Nurse Navigator Flowsheets 06/16/2021  Abnormal Finding Date -  Diagnosis Status -  Planned Course of Treatment Chemotherapy  Phase of Treatment Chemo  Chemotherapy Pending- Reason: Authorization  Radiation Actual Start Date: -  Radiation Actual End Date: -  Navigator Follow Up Date: 06/24/2021  Navigator Follow Up Reason: [facility]  Referral Date to RadOnc/MedOnc -  Navigator Encounter Type Appt/Treatment Plan Review;Telephone  Telephone Appt Confirmation/Clarification;Education;Outgoing Call  Patient Visit Type MedOnc  Treatment Phase Active Tx  Barriers/Navigation Needs Coordination of Care;Education  Education Other  Interventions Coordination of Care;Education;Psycho-Social Support  Acuity Level 2-Minimal Needs (1-2 Barriers Identified)  Coordination of Care Appts;Radiology  Education Method Verbal;Teach-back  Support Groups/Services Friends and Family  Time Spent with Patient 30

## 2021-06-18 ENCOUNTER — Telehealth: Payer: Self-pay

## 2021-06-18 NOTE — Progress Notes (Signed)
Pharmacist Chemotherapy Monitoring - Initial Assessment    Anticipated start date: 06/25/21   The following has been reviewed per standard work regarding the patient's treatment regimen: The patient's diagnosis, treatment plan and drug doses, and organ/hematologic function Lab orders and baseline tests specific to treatment regimen  The treatment plan start date, drug sequencing, and pre-medications Prior authorization status  Patient's documented medication list, including drug-drug interaction screen and prescriptions for anti-emetics and supportive care specific to the treatment regimen The drug concentrations, fluid compatibility, administration routes, and timing of the medications to be used The patient's access for treatment and lifetime cumulative dose history, if applicable  The patient's medication allergies and previous infusion related reactions, if applicable   Changes made to treatment plan:  pre-medications Pepcid from premix to admix  Follow up needed:  Pending authorization for treatment    Claybon Jabs, [facility], 06/18/2021  10:15 AM

## 2021-06-18 NOTE — Telephone Encounter (Signed)
Dr.Ennever completed peer to peer for patient, Josem Kaufmann number  [phone]. Authorization fwd to [employer].

## 2021-06-22 ENCOUNTER — Other Ambulatory Visit: Payer: Self-pay | Admitting: *Deleted

## 2021-06-22 DIAGNOSIS — C3491 Malignant neoplasm of unspecified part of right bronchus or lung: Secondary | ICD-10-CM

## 2021-06-22 MED ORDER — ONDANSETRON HCL 8 MG PO TABS: 8.0000 mg | ORAL_TABLET | Freq: Two times a day (BID) | ORAL | 1 refills | Status: DC | PRN

## 2021-06-22 MED ORDER — LIDOCAINE-PRILOCAINE 2.5-2.5 % EX CREA: TOPICAL_CREAM | CUTANEOUS | 3 refills | Status: DC

## 2021-06-22 MED ORDER — DEXAMETHASONE 4 MG PO TABS: 8.0000 mg | ORAL_TABLET | Freq: Every day | ORAL | 1 refills | Status: DC

## 2021-06-22 MED ORDER — PROCHLORPERAZINE MALEATE 10 MG PO TABS: 10.0000 mg | ORAL_TABLET | Freq: Four times a day (QID) | ORAL | 1 refills | Status: DC | PRN

## 2021-06-23 ENCOUNTER — Encounter: Payer: Self-pay | Admitting: *Deleted

## 2021-06-23 ENCOUNTER — Other Ambulatory Visit: Payer: Self-pay | Admitting: *Deleted

## 2021-06-23 ENCOUNTER — Other Ambulatory Visit: Payer: Self-pay | Admitting: Radiology

## 2021-06-23 ENCOUNTER — Inpatient Hospital Stay: Payer: Managed Care, Other (non HMO) | Attending: Internal Medicine

## 2021-06-23 ENCOUNTER — Other Ambulatory Visit: Payer: Self-pay

## 2021-06-23 ENCOUNTER — Other Ambulatory Visit: Payer: Self-pay | Admitting: Student

## 2021-06-23 DIAGNOSIS — C3411 Malignant neoplasm of upper lobe, right bronchus or lung: Secondary | ICD-10-CM | POA: Insufficient documentation

## 2021-06-23 DIAGNOSIS — Z5111 Encounter for antineoplastic chemotherapy: Secondary | ICD-10-CM | POA: Insufficient documentation

## 2021-06-23 DIAGNOSIS — Z7952 Long term (current) use of systemic steroids: Secondary | ICD-10-CM | POA: Insufficient documentation

## 2021-06-23 DIAGNOSIS — C7931 Secondary malignant neoplasm of brain: Secondary | ICD-10-CM | POA: Insufficient documentation

## 2021-06-23 DIAGNOSIS — Z5189 Encounter for other specified aftercare: Secondary | ICD-10-CM | POA: Insufficient documentation

## 2021-06-23 DIAGNOSIS — Z79899 Other long term (current) drug therapy: Secondary | ICD-10-CM | POA: Insufficient documentation

## 2021-06-23 DIAGNOSIS — Z5112 Encounter for antineoplastic immunotherapy: Secondary | ICD-10-CM | POA: Insufficient documentation

## 2021-06-23 DIAGNOSIS — C7970 Secondary malignant neoplasm of unspecified adrenal gland: Secondary | ICD-10-CM | POA: Insufficient documentation

## 2021-06-23 NOTE — Progress Notes (Signed)
Patient in chemotherapy education class with  brother.  Discussed side effects of Taxol, Carbo, Opdivo, Yervoy     which include but are not limited to myelosuppression, decreased appetite, fatigue, fever, allergic or infusional reaction, mucositis, cardiac toxicity, cough, SOB, altered taste, nausea and vomiting, diarrhea, constipation, elevated LFTs myalgia and arthralgias, hair loss or thinning, rash, skin dryness, nail changes, peripheral neuropathy, discolored urine, delayed wound healing, mental changes (Chemo brain), increased risk of infections, weight loss.  Reviewed infusion room and office policy and procedure and phone numbers 24 hours x 7 days a week.Reviewed when to call the office with any concerns or problems.  Scientist, clinical (histocompatibility and immunogenetics) given.  Discussed portacath insertion and EMLA cream administration.  Antiemetic protocol and chemotherapy schedule reviewed. Patient verbalized understanding of chemotherapy indications and possible side effects.  Teachback done

## 2021-06-24 ENCOUNTER — Ambulatory Visit (HOSPITAL_COMMUNITY)
Admission: RE | Admit: 2021-06-24 | Discharge: 2021-06-24 | Disposition: A | Discharge status: Home / Self Care | Payer: Managed Care, Other (non HMO) | Source: Ambulatory Visit | Attending: Hematology & Oncology | Admitting: Hematology & Oncology

## 2021-06-24 ENCOUNTER — Encounter: Payer: Self-pay | Admitting: *Deleted

## 2021-06-24 ENCOUNTER — Encounter (HOSPITAL_COMMUNITY): Payer: Self-pay

## 2021-06-24 ENCOUNTER — Other Ambulatory Visit: Payer: Self-pay | Admitting: Hematology & Oncology

## 2021-06-24 DIAGNOSIS — Z87891 Personal history of nicotine dependence: Secondary | ICD-10-CM | POA: Insufficient documentation

## 2021-06-24 DIAGNOSIS — C3491 Malignant neoplasm of unspecified part of right bronchus or lung: Secondary | ICD-10-CM | POA: Insufficient documentation

## 2021-06-24 DIAGNOSIS — G4730 Sleep apnea, unspecified: Secondary | ICD-10-CM | POA: Insufficient documentation

## 2021-06-24 DIAGNOSIS — C7931 Secondary malignant neoplasm of brain: Secondary | ICD-10-CM | POA: Diagnosis not present

## 2021-06-24 DIAGNOSIS — I10 Essential (primary) hypertension: Secondary | ICD-10-CM | POA: Diagnosis not present

## 2021-06-24 HISTORY — PX: IR IMAGING GUIDED PORT INSERTION: IMG5740

## 2021-06-24 MED ORDER — HEPARIN SOD (PORK) LOCK FLUSH 100 UNIT/ML IV SOLN
INTRAVENOUS | Status: AC
  Filled 2021-06-24: qty 5

## 2021-06-24 MED ORDER — LIDOCAINE HCL (PF) 1 % IJ SOLN
INTRAMUSCULAR | Status: AC | PRN
  Administered 2021-06-24: 10 mL via INTRADERMAL

## 2021-06-24 MED ORDER — MIDAZOLAM HCL 2 MG/2ML IJ SOLN
INTRAMUSCULAR | Status: AC | PRN
  Administered 2021-06-24: 1 mg via INTRAVENOUS

## 2021-06-24 MED ORDER — SODIUM CHLORIDE 0.9 % IV SOLN: INTRAVENOUS | Status: DC

## 2021-06-24 MED ORDER — FENTANYL CITRATE (PF) 100 MCG/2ML IJ SOLN
INTRAMUSCULAR | Status: AC
  Filled 2021-06-24: qty 2

## 2021-06-24 MED ORDER — LIDOCAINE HCL 1 % IJ SOLN
INTRAMUSCULAR | Status: AC
  Filled 2021-06-24: qty 20

## 2021-06-24 MED ORDER — FENTANYL CITRATE (PF) 100 MCG/2ML IJ SOLN
INTRAMUSCULAR | Status: AC | PRN
  Administered 2021-06-24: 50 ug via INTRAVENOUS

## 2021-06-24 MED ORDER — MIDAZOLAM HCL 2 MG/2ML IJ SOLN
INTRAMUSCULAR | Status: AC
  Filled 2021-06-24: qty 4

## 2021-06-24 MED ORDER — HEPARIN SOD (PORK) LOCK FLUSH 100 UNIT/ML IV SOLN
INTRAVENOUS | Status: AC | PRN
  Administered 2021-06-24: 500 [IU] via INTRAVENOUS

## 2021-06-24 NOTE — Progress Notes (Signed)
Oncology Nurse Navigator Documentation  Oncology Nurse Navigator Flowsheets 06/24/2021  Abnormal Finding Date -  Diagnosis Status -  Planned Course of Treatment -  Phase of Treatment -  Chemotherapy Pending- Reason: -  Radiation Actual Start Date: -  Radiation Actual End Date: -  Navigator Follow Up Date: 06/25/2021  Navigator Follow Up Reason: Chemotherapy  Navigator Location [facility]  Referral Date to RadOnc/MedOnc -  Navigator Encounter Type Appt/Treatment Plan Review  Telephone -  Patient Visit Type MedOnc  Treatment Phase Active Tx  Barriers/Navigation Needs Coordination of Care;Education  Education -  Interventions None Required  Acuity Level 2-Minimal Needs (1-2 Barriers Identified)  Coordination of Care Appts;Radiology  Education Method Verbal;Teach-back  Support Groups/Services Friends and Family  Time Spent with Patient 15

## 2021-06-24 NOTE — Procedures (Signed)
Interventional Radiology Procedure Note  Procedure: Placement of a right IJ approach single lumen PowerPort.  Tip is positioned at the superior cavoatrial junction and catheter is ready for immediate use.  Complications: None Recommendations:  - Ok to shower tomorrow - Do not submerge for 7 days - Routine line care   Signed,  Thang Flett S. Sanford Lindblad, DO   

## 2021-06-24 NOTE — Discharge Instructions (Signed)
For questions /concerns may call Interventional Radiology at [phone]  You may remove your dressing and shower tomorrow afternoon  DO NOT use EMLA cream for 2 weeks after port placement as the cream will remove surgical glue on your incision.      Moderate Conscious Sedation, Adult, Care After This sheet gives you information about how to care for yourself after your procedure. Your health care provider may also give you more specific instructions. If you have problems or questions, contact your health careprovider. What can I expect after the procedure? After the procedure, it is common to have: Sleepiness for several hours. Impaired judgment for several hours. Difficulty with balance. Vomiting if you eat too soon. Follow these instructions at home: For the time period you were told by your health care provider: Rest. Do not participate in activities where you could fall or become injured. Do not drive or use machinery. Do not drink alcohol. Do not take sleeping pills or medicines that cause drowsiness. Do not make important decisions or sign legal documents. Do not take care of children on your own. Eating and drinking  Follow the diet recommended by your health care provider. Drink enough fluid to keep your urine pale yellow. If you vomit: Drink water, juice, or soup when you can drink without vomiting. Make sure you have little or no nausea before eating solid foods.  General instructions Take over-the-counter and prescription medicines only as told by your health care provider. Have a responsible adult stay with you for the time you are told. It is important to have someone help care for you until you are awake and alert. Do not smoke. Keep all follow-up visits as told by your health care provider. This is important. Contact a health care provider if: You are still sleepy or having trouble with balance after 24 hours. You feel light-headed. You keep feeling nauseous  or you keep vomiting. You develop a rash. You have a fever. You have redness or swelling around the IV site. Get help right away if: You have trouble breathing. You have new-onset confusion at home. Summary After the procedure, it is common to feel sleepy, have impaired judgment, or feel nauseous if you eat too soon. Rest after you get home. Know the things you should not do after the procedure. Follow the diet recommended by your health care provider and drink enough fluid to keep your urine pale yellow. Get help right away if you have trouble breathing or new-onset confusion at home. This information is not intended to replace advice given to you by your health care provider. Make sure you discuss any questions you have with your healthcare provider.   Implanted Port Insertion, Care After This sheet gives you information about how to care for yourself after your procedure. Your health care provider may also give you more specific instructions. If you have problems or questions, contact your health careprovider. What can I expect after the procedure? After the procedure, it is common to have: Discomfort at the port insertion site. Bruising on the skin over the port. This should improve over 3-4 days. Follow these instructions at home: [facility] care After your port is placed, you will get a manufacturer's information card. The card has information about your port. Keep this card with you at all times. Take care of the port as told by your health care provider. Ask your health care provider if you or a family member can get training for taking care of the port at  home. A home health care nurse may also take care of the port. Make sure to remember what type of port you have. Incision care Follow instructions from your health care provider about how to take care of your port insertion site. Make sure you: Wash your hands with soap and water before and after you change your bandage (dressing). If  soap and water are not available, use hand sanitizer. Change your dressing as told by your health care provider. Leave skin glue, or adhesive strips in place. These skin closures may need to stay in place for 2 weeks or longer.  Check your port insertion site every day for signs of infection. Check for:      - Redness, swelling, or pain.                     - Fluid or blood.      - Warmth.      - Pus or a bad smell. Activity Return to your normal activities as told by your health care provider. Ask your health care provider what activities are safe for you. Do not lift anything that is heavier than 10 lb (4.5 kg), or the limit that you are told, until your health care provider says that it is safe. General instructions Take over-the-counter and prescription medicines only as told by your health care provider. Do not take baths, swim, or use a hot tub until your health care provider approves. Ask your health care provider if you may take showers. You may only be allowed to take sponge baths. Do not drive for 24 hours if you were given a sedative during your procedure. Wear a medical alert bracelet in case of an emergency. This will tell any health care providers that you have a port. Keep all follow-up visits as told by your health care provider. This is important. Contact a health care provider if: You cannot flush your port with saline as directed, or you cannot draw blood from the port. You have a fever or chills. You have redness, swelling, or pain around your port insertion site. You have fluid or blood coming from your port insertion site. Your port insertion site feels warm to the touch. You have pus or a bad smell coming from the port insertion site. Get help right away if: You have chest pain or shortness of breath. You have bleeding from your port that you cannot control. Summary Take care of the port as told by your health care provider. Keep the manufacturer's information card  with you at all times. Change your dressing as told by your health care provider. Contact a health care provider if you have a fever or chills or if you have redness, swelling, or pain around your port insertion site. Keep all follow-up visits as told by your health care provider. This information is not intended to replace advice given to you by your health care provider. Make sure you discuss any questions you have with your healthcare provider. Document Revised: 02/28/2018 Document Reviewed: 02/28/2018

## 2021-06-24 NOTE — H&P (Signed)
Chief Complaint: Patient was seen in consultation today for image guided Port-A-Cath placement at the request of Ennever,Peter R  Referring Physician(s): Ennever,Peter R  Supervising Physician: Corrie Mckusick  Patient Status: [facility] - Out-pt  History of Present Illness: Kevin Hunter is a 55 y.o. 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 Reatha Armour on 06/12/2021.  Patient presented to ED on 05/20/2021 with headache, CT head showed no discrete parenchymal lesions in the right frontal and left parietal lobes with imaging features suspicious for metastases.  Patient underwent CT CAP for further work-up which showed mass of the right lung apex favoring primary lung malignancy.  Patient was referred to pulmonology disease and underwent bronchoscopy with biopsy on 05/22/2021, pathology revealed metastatic non-small cell carcinoma. Patient was referred to hematology oncology and has been followed by Dr. Marin Olp, patient also underwent radiosurgery for the brain mets on 06/12/2021. Patient had a follow-up visit with Dr. Marin Olp on 06/15/2021 when a systemic chemotherapy was recommended to the patient for the lung cancer treatment.  After thorough discussion and shared decision-making, patient decided to proceed with the chemotherapy.  IR was requested for image guided Port-A-Cath placement. Patient presents to [facility], IR today for the procedure.  Patient laying in bed, not in acute distress.  Denise headache, fever, chills, shortness of breath, cough, chest pain, abdominal pain, nausea ,vomiting, and bleeding.    Past Medical History:  Diagnosis Date   Anxiety    Bipolar 1 disorder ([place])    Goals of care, counseling/discussion 05/27/2021   Hypertension    Lung cancer, primary, with metastasis from lung to other site, right ([place]) 05/27/2021   Mood disorder ([place])     Sleep apnea     Past Surgical History:  Procedure Laterality Date   BRONCHIAL BIOPSY  05/22/2021   Procedure: BRONCHIAL BIOPSIES;  Surgeon: Garner Nash, DO;  Location: [place];  Service: Pulmonary;;   BRONCHIAL BRUSHINGS  05/22/2021   Procedure: BRONCHIAL BRUSHINGS;  Surgeon: Garner Nash, DO;  Location: [place] [facility];  Service: Pulmonary;;   BRONCHIAL NEEDLE ASPIRATION BIOPSY  05/22/2021   Procedure: BRONCHIAL NEEDLE ASPIRATION BIOPSIES;  Surgeon: Garner Nash, DO;  Location: [place] [facility];  Service: Pulmonary;;   VIDEO BRONCHOSCOPY WITH ENDOBRONCHIAL NAVIGATION N/A 05/22/2021   Procedure: VIDEO BRONCHOSCOPY WITH ENDOBRONCHIAL NAVIGATION;  Surgeon: Garner Nash, DO;  Location: [place];  Service: Pulmonary;  Laterality: N/A;   VIDEO BRONCHOSCOPY WITH RADIAL ENDOBRONCHIAL ULTRASOUND  05/22/2021   Procedure: RADIAL ENDOBRONCHIAL ULTRASOUND;  Surgeon: Garner Nash, DO;  Location: [place] [facility];  Service: Pulmonary;;    Allergies: Penicillins  Medications: Prior to Admission medications   Medication Sig Start Date End Date Taking? Authorizing Provider  amLODipine (NORVASC) 5 MG tablet Take 5 mg by mouth at bedtime.    [provider]  clonazePAM (KLONOPIN) 1 MG tablet Take 1 mg by mouth 2 (two) times daily as needed for anxiety (May take additional 0.5mg  as needed for panic attack).    [provider]  dexamethasone (DECADRON) 4 MG tablet Take 1 tablet (4 mg total) by mouth 2 (two) times daily. 05/28/21   Hayden Pedro, PA-C  dexamethasone (DECADRON) 4 MG tablet Take 2 tablets (8 mg total) by mouth daily. Start the day after carboplatin chemotherapy for 3 days. 06/22/21   [name], MD  ibuprofen (ADVIL) 200 MG tablet Take 800 mg by  mouth every 6 (six) hours as needed for headache.    [provider]  levETIRAcetam (KEPPRA) 500 MG tablet Take 500 mg by mouth 2 (two) times daily. 06/05/21   [provider]   lidocaine-prilocaine (EMLA) cream Apply to affected area once 06/22/21   Ennever, Rudell Cobb, MD  ondansetron (ZOFRAN) 8 MG tablet Take 1 tablet (8 mg total) by mouth 2 (two) times daily as needed for refractory nausea / vomiting. Start on day 3 after carboplatin chemo. 06/22/21   [name], MD  prochlorperazine (COMPAZINE) 10 MG tablet Take 1 tablet (10 mg total) by mouth every 6 (six) hours as needed (Nausea or vomiting). 06/22/21   [name], MD  pseudoephedrine (SUDAFED) 30 MG tablet Take 60 mg by mouth every 4 (four) hours as needed (headache).    [provider]  psyllium (METAMUCIL) 58.6 % packet Take 1 packet by mouth daily.    [provider]  QUEtiapine (SEROQUEL XR) 400 MG 24 hr tablet Take 400 mg by mouth at bedtime. 04/23/21   [provider]  tolterodine (DETROL LA) 4 MG 24 hr capsule Take 4 mg by mouth at bedtime. 01/25/21   [provider]  traZODone (DESYREL) 100 MG tablet Take 200 mg by mouth at bedtime.    [provider]  venlafaxine (EFFEXOR) 75 MG tablet Take 150 mg by mouth at bedtime. 04/24/21   [provider]     Family History  Problem Relation Age of Onset   Breast cancer Mother    Lung cancer Father     Social History   Socioeconomic History   Marital status: Single    Spouse name: Not on file   Number of children: Not on file   Years of education: Not on file   Highest education level: Not on file  Occupational History   Not on file  Tobacco Use   Smoking status: Former    Types: Cigarettes, E-cigarettes    Start date: 05/16/2018    Quit date: 05/16/2018    Years since quitting: 3.1   Smokeless tobacco: Never  Vaping Use   Vaping Use: Every day   Start date: 05/16/2018  Substance and Sexual Activity   Alcohol use: Never   Drug use: Never   Sexual activity: Not on file  Other Topics Concern   Not on file  Social History Narrative   Not on file   Social Determinants of Health    Financial Resource Strain: Not on file  Food Insecurity: Not on file  Transportation Needs: Not on file  Physical Activity: Not on file  Stress: Not on file  Social Connections: Not on file     Review of Systems: A 12 point ROS discussed and pertinent positives are indicated in the HPI above.  All other systems are negative.  Vital Signs: There were no vitals taken for this visit.   Physical Exam Vitals and nursing note reviewed.  Constitutional:      General: Patient is not in acute distress.    Appearance: Normal appearance. Patient is not ill-appearing.  HENT:     Head: Normocephalic and atraumatic.     Mouth/Throat:     Mouth: Mucous membranes are moist.     Pharynx: Oropharynx is clear.  Cardiovascular:     Rate and Rhythm: Normal rate and regular rhythm.     Pulses: Normal pulses.     Heart sounds: Normal heart sounds.  Pulmonary:     Effort:  Pulmonary effort is normal.     Breath sounds: Normal breath sounds.  Abdominal:     General: Abdomen is flat. Bowel sounds are normal.     Palpations: Abdomen is soft.  Musculoskeletal:     Cervical back: Neck supple.  Skin:    General: Skin is warm and dry.     Coloration: Skin is not jaundiced or pale.  Neurological:     Mental Status: Patient is alert and oriented to person, place, and time.  Psychiatric:        Mood and Affect: Mood normal.        Behavior: Behavior normal.        Judgment: Judgment normal.    MD Evaluation Airway: WNL Heart: WNL Abdomen: WNL Chest/ Lungs: WNL ASA  Classification: 3 Mallampati/Airway Score: Two  Imaging: MR Brain W Wo Contrast  Result Date: 06/05/2021 CLINICAL DATA:  Brain/CNS neoplasm, staging 3T SRS Protocol for radiation treatment planning. Baseline SRS exam. EXAM: MRI HEAD WITHOUT AND WITH CONTRAST TECHNIQUE: Multiplanar, multiecho pulse sequences of the brain and surrounding structures were obtained without and with intravenous contrast. CONTRAST:  3mL MULTIHANCE  GADOBENATE DIMEGLUMINE 529 MG/ML IV SOLN COMPARISON:  Brain MRI 05/21/2021 FINDINGS: Brain: There is no acute infarct or acute intracranial hemorrhage. No hydrocephalus. There are multiple contrast-enhancing lesions: 1. Inferior left frontal lobe, 8 mm, series 11, image 110. Unchanged. Minimal unchanged adjacent edema. 2. Posterior left parietal lobe, 20 mm, unchanged. Image 116. Unchanged surrounding edema. 3. Right frontal lobe, 21 mm, unchanged. Image 143. Surrounding edema is unchanged. No new lesions. Vascular: Major flow voids are preserved. Skull and upper cervical spine: Normal calvarium and skull base. Visualized upper cervical spine and soft tissues are normal. Sinuses/Orbits:No paranasal sinus fluid levels or advanced mucosal thickening. No mastoid or middle ear effusion. Normal orbits. IMPRESSION: Unchanged bilateral frontal and left parietal lobe contrast-enhancing lesions with surrounding edema. No new lesions. Electronically Signed   By: Ulyses Jarred M.D.   On: 06/05/2021 16:26   NM PET Image Initial (PI) Skull Base To Thigh  Result Date: 06/11/2021 CLINICAL DATA:  Initial treatment strategy for non-small cell lung cancer. EXAM: NUCLEAR MEDICINE PET SKULL BASE TO THIGH TECHNIQUE: 15.3 mCi F-18 FDG was injected intravenously. Full-ring PET imaging was performed from the skull base to thigh after the radiotracer. CT data was obtained and used for attenuation correction and anatomic localization. Fasting blood glucose: 92 mg/dl COMPARISON:  Chest CT 05/20/2021 FINDINGS: Mediastinal blood pool activity: SUV max 2.4 Liver activity: SUV max 3.6 NECK: No hypermetabolic lymph nodes in the neck. Incidental CT findings: none CHEST: Two adjacent hypermetabolic lesions in the RIGHT upper lobe with SUV max equal 10.2. The lateral mass measures 3.7 cm with central photopenia consistent necrosis. The medial nodule measures 2.4 cm and is uniformly hypermetabolic. Interestingly, the medial nodule is decreased in  size from comparison CT (probable tissue sampling effect). Hypermetabolic RIGHT lower paratracheal node with SUV max equal 10.3 measures 17 mm. Hypermetabolic RIGHT axillary lymph node measuring 13 mm with SUV max equal 8.6. This node has relatively normal morphology. Incidental CT findings: none ABDOMEN/PELVIS: Intensely hypermetabolic enlarged RIGHT adrenal gland measures 3.5 cm with SUV max equal 9.6. Hypermetabolic periaortic lymph node between the IVC and aorta the level the kidneys measures 7 mm with SUV max equal 11.1 on image 138. Incidental CT findings: none SKELETON: No focal hypermetabolic activity to suggest skeletal metastasis. Incidental CT findings: none IMPRESSION: 1. Adjacent hypermetabolic mass lesions in the RIGHT upper  lobe consistent bronchogenic carcinoma. 2. Hypermetabolic ipsilateral mediastinal nodal metastasis. 3. Probable metastatic RIGHT axillary node 4. Hypermetabolic RIGHT adrenal gland metastasis. 5. Small periaortic hypermetabolic retroperitoneal nodal metastasis. Electronically Signed   By: Suzy Bouchard M.D.   On: 06/11/2021 15:28    Labs:  CBC: Recent Labs    05/20/21 1324 05/21/21 0457 05/27/21 1104 06/15/21 1241  WBC 9.7 11.3* 11.8* 16.5*  HGB 14.5 15.7 14.7 15.9  HCT 42.1 45.4 43.4 47.0  PLT 261 280 257 230    COAGS: No results for input(s): INR, APTT in the last 8760 hours.  BMP: Recent Labs    05/20/21 1324 05/21/21 0457 05/27/21 1104 06/15/21 1241  NA 133* 135 137 138  K 3.4* 4.2 3.5 4.4  CL 102 101 100 100  CO2 24 23 28 31   GLUCOSE 91 122* 111* 71  BUN 16 14 15 15   CALCIUM 8.8* 9.4 9.5 9.5  CREATININE 1.14 1.10 1.24 1.09  GFRNONAA >60 >60 >60 >60    LIVER FUNCTION TESTS: Recent Labs    05/20/21 1324 05/21/21 0457 05/27/21 1104 06/15/21 1241  BILITOT 0.3 0.4 0.4 0.4  AST 22 25 13* 19  ALT 24 26 17 31   ALKPHOS 72 76 56 75  PROT 7.8 8.3* 6.8 6.9  ALBUMIN 4.1 4.0 3.9 4.0    TUMOR MARKERS: No results for input(s): AFPTM,  CEA, CA199, CHROMGRNA in the last 8760 hours.  Assessment and Plan: 55 y.o. male with metastatic non-small cell lung cancer biopsy-proven on 05/22/2021 with metastasis to the brain, s/p radiosurgery for CNS metastasis by Dr. Pieter Partridge on 06/12/2021.  Patient has been followed by Dr. Marin Olp from oncology who recommended systemic chemotherapy for the patient.  After thorough discussion decision-making, patient decided to proceed with the chemotherapy.  IR was requested for image guided Port-A-Cath placement. Patient presents to [facility], IR today for the procedure N.p.o. since 7 AM VS HTN 154/94 Not on AC/AP treatment  Risks and benefits of image guided port-a-catheter placement was discussed with the patient including, but not limited to bleeding, infection, pneumothorax, or fibrin sheath development and need for additional procedures.  All of the patient's questions were answered, patient is agreeable to proceed. Consent signed and in chart.   Thank you for this interesting consult.  I greatly enjoyed meeting Kevin Hunter and look forward to participating in their care.  A copy of this report was sent to the requesting provider on this date.  Electronically Signed: Tera Mater, PA-C 06/24/2021, 1:18 PM   I spent a total of  30 Minutes   in face to face in clinical consultation, greater than 50% of which was counseling/coordinating care for [facility] placement.  This chart was dictated using voice recognition software.  Despite best efforts to proofread,  errors can occur which can change the documentation meaning.

## 2021-06-25 ENCOUNTER — Inpatient Hospital Stay: Payer: Managed Care, Other (non HMO)

## 2021-06-25 ENCOUNTER — Encounter: Payer: Self-pay | Admitting: Hematology & Oncology

## 2021-06-25 ENCOUNTER — Other Ambulatory Visit: Payer: Self-pay

## 2021-06-25 ENCOUNTER — Encounter: Payer: Self-pay | Admitting: *Deleted

## 2021-06-25 VITALS — BP 128/74 | HR 84 | Temp 98.5°F | Resp 17

## 2021-06-25 DIAGNOSIS — C3411 Malignant neoplasm of upper lobe, right bronchus or lung: Secondary | ICD-10-CM | POA: Diagnosis present

## 2021-06-25 DIAGNOSIS — I10 Essential (primary) hypertension: Secondary | ICD-10-CM

## 2021-06-25 DIAGNOSIS — C7970 Secondary malignant neoplasm of unspecified adrenal gland: Secondary | ICD-10-CM | POA: Diagnosis not present

## 2021-06-25 DIAGNOSIS — Z7952 Long term (current) use of systemic steroids: Secondary | ICD-10-CM | POA: Diagnosis not present

## 2021-06-25 DIAGNOSIS — C3491 Malignant neoplasm of unspecified part of right bronchus or lung: Secondary | ICD-10-CM

## 2021-06-25 DIAGNOSIS — Z5111 Encounter for antineoplastic chemotherapy: Secondary | ICD-10-CM | POA: Diagnosis present

## 2021-06-25 DIAGNOSIS — Z79899 Other long term (current) drug therapy: Secondary | ICD-10-CM | POA: Diagnosis not present

## 2021-06-25 DIAGNOSIS — C7931 Secondary malignant neoplasm of brain: Secondary | ICD-10-CM | POA: Diagnosis not present

## 2021-06-25 DIAGNOSIS — Z5112 Encounter for antineoplastic immunotherapy: Secondary | ICD-10-CM | POA: Diagnosis present

## 2021-06-25 DIAGNOSIS — Z5189 Encounter for other specified aftercare: Secondary | ICD-10-CM | POA: Diagnosis not present

## 2021-06-25 LAB — CBC WITH DIFFERENTIAL (CANCER CENTER ONLY)
Abs Immature Granulocytes: 0.1 10*3/uL — ABNORMAL HIGH (ref 0.00–0.07)
Basophils Absolute: 0 10*3/uL (ref 0.0–0.1)
Basophils Relative: 0 %
Eosinophils Absolute: 0.1 10*3/uL (ref 0.0–0.5)
Eosinophils Relative: 1 %
HCT: 43.2 % (ref 39.0–52.0)
Hemoglobin: 14.3 g/dL (ref 13.0–17.0)
Immature Granulocytes: 1 %
Lymphocytes Relative: 11 %
Lymphs Abs: 1.1 10*3/uL (ref 0.7–4.0)
MCH: 30 pg (ref 26.0–34.0)
MCHC: 33.1 g/dL (ref 30.0–36.0)
MCV: 90.6 fL (ref 80.0–100.0)
Monocytes Absolute: 0.8 10*3/uL (ref 0.1–1.0)
Monocytes Relative: 8 %
Neutro Abs: 7.9 10*3/uL — ABNORMAL HIGH (ref 1.7–7.7)
Neutrophils Relative %: 79 %
Platelet Count: 168 10*3/uL (ref 150–400)
RBC: 4.77 MIL/uL (ref 4.22–5.81)
RDW: 15.9 % — ABNORMAL HIGH (ref 11.5–15.5)
WBC Count: 10 10*3/uL (ref 4.0–10.5)
nRBC: 0 % (ref 0.0–0.2)

## 2021-06-25 LAB — CMP (CANCER CENTER ONLY)
ALT: 30 U/L (ref 0–44)
AST: 16 U/L (ref 15–41)
Albumin: 3.7 g/dL (ref 3.5–5.0)
Alkaline Phosphatase: 64 U/L (ref 38–126)
Anion gap: 9 (ref 5–15)
BUN: 13 mg/dL (ref 6–20)
CO2: 29 mmol/L (ref 22–32)
Calcium: 9.2 mg/dL (ref 8.9–10.3)
Chloride: 100 mmol/L (ref 98–111)
Creatinine: 1.06 mg/dL (ref 0.61–1.24)
GFR, Estimated: >60 mL/min (ref >60)
Glucose, Bld: 92 mg/dL (ref 70–99)
Potassium: 3.5 mmol/L (ref 3.5–5.1)
Sodium: 138 mmol/L (ref 135–145)
Total Bilirubin: 0.4 mg/dL (ref 0.3–1.2)
Total Protein: 6.5 g/dL (ref 6.5–8.1)

## 2021-06-25 LAB — TSH: TSH: 0.87 u[IU]/mL (ref 0.320–4.118)

## 2021-06-25 LAB — LACTATE DEHYDROGENASE: LDH: 263 U/L — ABNORMAL HIGH (ref 98–192)

## 2021-06-25 MED ORDER — SODIUM CHLORIDE 0.9% FLUSH
10.0000 mL | INTRAVENOUS | Status: DC | PRN
  Administered 2021-06-25: 10 mL

## 2021-06-25 MED ORDER — FAMOTIDINE 20 MG IN NS 100 ML IVPB
20.0000 mg | Freq: Once | INTRAVENOUS | Status: AC
  Administered 2021-06-25: 20 mg via INTRAVENOUS
  Filled 2021-06-25: qty 20

## 2021-06-25 MED ORDER — PALONOSETRON HCL INJECTION 0.25 MG/5ML
0.2500 mg | Freq: Once | INTRAVENOUS | Status: AC
  Administered 2021-06-25: 0.25 mg via INTRAVENOUS
  Filled 2021-06-25: qty 5

## 2021-06-25 MED ORDER — SODIUM CHLORIDE 0.9 % IV SOLN
150.0000 mg | Freq: Once | INTRAVENOUS | Status: AC
  Administered 2021-06-25: 150 mg via INTRAVENOUS
  Filled 2021-06-25: qty 150

## 2021-06-25 MED ORDER — SODIUM CHLORIDE 0.9 % IV SOLN: Freq: Once | INTRAVENOUS | Status: AC

## 2021-06-25 MED ORDER — SODIUM CHLORIDE 0.9 % IV SOLN
900.0000 mg | Freq: Once | INTRAVENOUS | Status: AC
  Administered 2021-06-25: 900 mg via INTRAVENOUS
  Filled 2021-06-25: qty 90

## 2021-06-25 MED ORDER — SODIUM CHLORIDE 0.9 % IV SOLN
10.0000 mg | Freq: Once | INTRAVENOUS | Status: AC
  Administered 2021-06-25: 10 mg via INTRAVENOUS
  Filled 2021-06-25: qty 10

## 2021-06-25 MED ORDER — SODIUM CHLORIDE 0.9 % IV SOLN
200.0000 mg/m2 | Freq: Once | INTRAVENOUS | Status: AC
  Administered 2021-06-25: 516 mg via INTRAVENOUS
  Filled 2021-06-25: qty 86

## 2021-06-25 MED ORDER — DIPHENHYDRAMINE HCL 50 MG/ML IJ SOLN
50.0000 mg | Freq: Once | INTRAMUSCULAR | Status: AC
  Administered 2021-06-25: 50 mg via INTRAVENOUS
  Filled 2021-06-25: qty 1

## 2021-06-25 MED ORDER — HEPARIN SOD (PORK) LOCK FLUSH 100 UNIT/ML IV SOLN
500.0000 [IU] | Freq: Once | INTRAVENOUS | Status: AC | PRN
  Administered 2021-06-25: 500 [IU]

## 2021-06-25 MED ORDER — SODIUM CHLORIDE 0.9 % IV SOLN
1.0000 mg/kg | Freq: Once | INTRAVENOUS | Status: AC
  Administered 2021-06-25: 135 mg via INTRAVENOUS
  Filled 2021-06-25: qty 27

## 2021-06-25 MED ORDER — SODIUM CHLORIDE 0.9 % IV SOLN
360.0000 mg | Freq: Once | INTRAVENOUS | Status: AC
  Administered 2021-06-25: 360 mg via INTRAVENOUS
  Filled 2021-06-25: qty 24

## 2021-06-25 NOTE — Progress Notes (Signed)
Patient here to begin his first treatment. He has his port placed and completed chemo education this week. Per the infusion RN he is doing well and states his readiness to begin.  Oncology Nurse Navigator Documentation  Oncology Nurse Navigator Flowsheets 06/25/2021  Abnormal Finding Date -  Diagnosis Status -  Planned Course of Treatment -  Phase of Treatment Chemo  Chemotherapy Pending- Reason: -  Chemotherapy Actual Start Date: 06/25/2021  Chemotherapy Expected End Date: 12/31/2021  Radiation Actual Start Date: -  Radiation Actual End Date: -  Navigator Follow Up Date: 07/14/2021  Navigator Follow Up Reason: Follow-up Appointment;Chemotherapy  Navigator Location [facility]  Referral Date to RadOnc/MedOnc -  Navigator Encounter Type Treatment  Telephone -  Treatment Initiated Date 06/12/2021  Patient Visit Type MedOnc  Treatment Phase Active Tx  Barriers/Navigation Needs Coordination of Care;Education  Education -  Interventions None Required  Acuity Level 2-Minimal Needs (1-2 Barriers Identified)  Coordination of Care -  Education Method -  Support Groups/Services Friends and Family  Time Spent with Patient 30

## 2021-06-25 NOTE — Progress Notes (Signed)
                                                                                                                                                             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  Intent: Palliative  Radiation Treatment Dates:  06/12/2021 through 06/12/2021 SRS Treatment Site Technique Total Dose (Gy) Dose per Fx (Gy) Completed Fx Beam Energies  Brain: Each site was treated in 1 fraction to 20 Gy  PTV_1_RtFront68mm PTV_2_LtPariet16mm  PTV_3_LtFront60mm IMRT 20/20 20 1/1 6XFFF   Narrative: The patient tolerated radiation therapy relatively well. He was given a steroid taper at the conclusion of therapy.  Plan: The patient will receive a call in about one month from the radiation oncology department. He will continue follow up with Dr. Marin Olp as well.   ________________________________________________    Carola Rhine, [facility]

## 2021-06-25 NOTE — Patient Instructions (Signed)
[facility]  Discharge Instructions: Thank you for choosing [facility] to provide your oncology and hematology care.   If you have a lab appointment with the [facility], please go directly to the [facility] and check in at the registration area.  Wear comfortable clothing and clothing appropriate for easy access to any Portacath or PICC line.   We strive to give you quality time with your provider. You may need to reschedule your appointment if you arrive late (15 or more minutes).  Arriving late affects you and other patients whose appointments are after yours.  Also, if you miss three or more appointments without notifying the office, you may be dismissed from the clinic at the provider's discretion.      For prescription refill requests, have your pharmacy contact our office and allow 72 hours for refills to be completed.    Today you received the following chemotherapy and/or immunotherapy agents Yervoy, Opdivo, Taxol and Carboplatin      To help prevent nausea and vomiting after your treatment, we encourage you to take your nausea medication as directed.  BELOW ARE SYMPTOMS THAT SHOULD BE REPORTED IMMEDIATELY: *FEVER GREATER THAN 100.4 F (38 C) OR HIGHER *CHILLS OR SWEATING *NAUSEA AND VOMITING THAT IS NOT CONTROLLED WITH YOUR NAUSEA MEDICATION *UNUSUAL SHORTNESS OF BREATH *UNUSUAL BRUISING OR BLEEDING *URINARY PROBLEMS (pain or burning when urinating, or frequent urination) *BOWEL PROBLEMS (unusual diarrhea, constipation, pain near the anus) TENDERNESS IN MOUTH AND THROAT WITH OR WITHOUT PRESENCE OF ULCERS (sore throat, sores in mouth, or a toothache) UNUSUAL RASH, SWELLING OR PAIN  UNUSUAL VAGINAL DISCHARGE OR ITCHING   Items with * indicate a potential emergency and should be followed up as soon as possible or go to the Emergency Department if any problems should occur.  Please show the CHEMOTHERAPY ALERT CARD or IMMUNOTHERAPY  ALERT CARD at check-in to the Emergency Department and triage nurse. Should you have questions after your visit or need to cancel or reschedule your appointment, please contact [facility]  [phone] and follow the prompts.  Office hours are 8:00 a.m. to 4:30 p.m. Monday - Friday. Please note that voicemails left after 4:00 p.m. may not be returned until the following business day.  We are closed weekends and major holidays. You have access to a nurse at all times for urgent questions. Please call the main number to the clinic [phone] and follow the prompts.  For any non-urgent questions, you may also contact your provider using MyChart. We now offer e-Visits for anyone 87 and older to request care online for non-urgent symptoms. For details visit [email or web address].   Also download the MyChart app! Go to the app store, search "MyChart", open the app, select [facility], and log in with your MyChart username and password.  Due to Covid, a mask is required upon entering the hospital/clinic. If you do not have a mask, one will be given to you upon arrival. For doctor visits, patients may have 1 support person aged 52 or older with them. For treatment visits, patients cannot have anyone with them due to current Covid guidelines and our immunocompromised population.

## 2021-06-25 NOTE — Patient Instructions (Signed)

## 2021-06-26 ENCOUNTER — Inpatient Hospital Stay: Payer: Managed Care, Other (non HMO)

## 2021-06-26 VITALS — BP 147/79 | HR 83 | Temp 98.2°F | Resp 19

## 2021-06-26 DIAGNOSIS — Z5112 Encounter for antineoplastic immunotherapy: Secondary | ICD-10-CM | POA: Diagnosis not present

## 2021-06-26 DIAGNOSIS — C3491 Malignant neoplasm of unspecified part of right bronchus or lung: Secondary | ICD-10-CM

## 2021-06-26 LAB — T4: T4, Total: 6 ug/dL (ref 4.5–12.0)

## 2021-06-26 MED ORDER — PEGFILGRASTIM-CBQV 6 MG/0.6ML ~~LOC~~ SOSY
6.0000 mg | PREFILLED_SYRINGE | Freq: Once | SUBCUTANEOUS | Status: AC
  Administered 2021-06-26: 6 mg via SUBCUTANEOUS
  Filled 2021-06-26: qty 0.6

## 2021-06-26 NOTE — Progress Notes (Signed)
Pt return to clinic for injection. Pt stated since receiving his chemotherapy he is feeling ok and has no complaints. Pt denies any nausea.  Pt stated he is feeling tired but no different than his norm. Pt educated on pegfilgrastim injection and side effects. Pt verbalized understanding and had no further questions.

## 2021-06-26 NOTE — Patient Instructions (Signed)

## 2021-07-01 ENCOUNTER — Encounter: Payer: Self-pay | Admitting: Hematology & Oncology

## 2021-07-02 ENCOUNTER — Encounter: Payer: Self-pay | Admitting: *Deleted

## 2021-07-02 ENCOUNTER — Encounter: Payer: Self-pay | Admitting: Hematology & Oncology

## 2021-07-02 NOTE — Progress Notes (Signed)
FMLA completed and faxed to [phone]  Oncology Nurse Navigator Documentation  Oncology Nurse Navigator Flowsheets 07/02/2021  Abnormal Finding Date -  Diagnosis Status -  Planned Course of Treatment -  Phase of Treatment -  Chemotherapy Pending- Reason: -  Chemotherapy Actual Start Date: -  Chemotherapy Expected End Date: -  Radiation Actual Start Date: -  Radiation Actual End Date: -  Navigator Follow Up Date: 07/14/2021  Navigator Follow Up Reason: Follow-up Appointment;Chemotherapy  Navigator Location [facility]  Referral Date to RadOnc/MedOnc -  Navigator Encounter Type MyChart;Letter/Fax/Email  Telephone -  Treatment Initiated Date -  Patient Visit Type MedOnc  Treatment Phase Active Tx  Barriers/Navigation Needs Coordination of Care;Education  Education -  Interventions Disability/FMLA  Acuity Level 2-Minimal Needs (1-2 Barriers Identified)  Coordination of Care -  Education Method -  Support Groups/Services Friends and Family  Time Spent with Patient 52

## 2021-07-03 ENCOUNTER — Other Ambulatory Visit: Payer: Self-pay | Admitting: *Deleted

## 2021-07-03 MED ORDER — PREDNISONE 20 MG PO TABS: 80.0000 mg | ORAL_TABLET | Freq: Every day | ORAL | 0 refills | Status: DC

## 2021-07-06 ENCOUNTER — Other Ambulatory Visit: Payer: Managed Care, Other (non HMO)

## 2021-07-06 ENCOUNTER — Encounter: Payer: Self-pay | Admitting: *Deleted

## 2021-07-06 ENCOUNTER — Ambulatory Visit: Payer: Managed Care, Other (non HMO) | Admitting: Family

## 2021-07-06 NOTE — Progress Notes (Signed)
Patient reached out last week due to rash which was felt to be related to his treatment with immunotherapy. He was started on steroids and Dr Marin Olp wanted him to come in this week to be seen. Appointment was originally scheduled for today and notified patient via [facility], but patient did not get notification. Appointment rescheduled for tomorrow, 07/07/21.   Called patient and made verbal contact. He is aware of tomorrow's appointment.   Oncology Nurse Navigator Documentation  Oncology Nurse Navigator Flowsheets 07/06/2021  Abnormal Finding Date -  Diagnosis Status -  Planned Course of Treatment -  Phase of Treatment -  Chemotherapy Pending- Reason: -  Chemotherapy Actual Start Date: -  Chemotherapy Expected End Date: -  Radiation Actual Start Date: -  Radiation Actual End Date: -  Navigator Follow Up Date: 07/14/2021  Navigator Follow Up Reason: Follow-up Appointment;Chemotherapy  Navigator Location [facility]  Referral Date to RadOnc/MedOnc -  Navigator Encounter Type MyChart;Telephone  Telephone Appt Confirmation/Clarification;Outgoing Call  Treatment Initiated Date -  Patient Visit Type MedOnc  Treatment Phase Active Tx  Barriers/Navigation Needs Coordination of Care;Education  Education Other  Interventions Coordination of Care  Acuity Level 2-Minimal Needs (1-2 Barriers Identified)  Coordination of Care Appts  Education Method Verbal  Support Groups/Services Friends and Family  Time Spent with Patient 15

## 2021-07-07 ENCOUNTER — Inpatient Hospital Stay: Payer: Managed Care, Other (non HMO)

## 2021-07-07 ENCOUNTER — Other Ambulatory Visit: Payer: Managed Care, Other (non HMO)

## 2021-07-07 ENCOUNTER — Other Ambulatory Visit: Payer: Self-pay

## 2021-07-07 ENCOUNTER — Inpatient Hospital Stay (HOSPITAL_BASED_OUTPATIENT_CLINIC_OR_DEPARTMENT_OTHER): Payer: Managed Care, Other (non HMO) | Admitting: Family

## 2021-07-07 ENCOUNTER — Encounter: Payer: Self-pay | Admitting: Family

## 2021-07-07 VITALS — BP 134/93 | HR 82 | Temp 97.8°F | Resp 22 | Ht 70.0 in | Wt 291.0 lb

## 2021-07-07 DIAGNOSIS — E032 Hypothyroidism due to medicaments and other exogenous substances: Secondary | ICD-10-CM

## 2021-07-07 DIAGNOSIS — C3491 Malignant neoplasm of unspecified part of right bronchus or lung: Secondary | ICD-10-CM

## 2021-07-07 DIAGNOSIS — Z5112 Encounter for antineoplastic immunotherapy: Secondary | ICD-10-CM | POA: Diagnosis not present

## 2021-07-07 LAB — CBC WITH DIFFERENTIAL (CANCER CENTER ONLY)
Abs Immature Granulocytes: 3.77 10*3/uL — ABNORMAL HIGH (ref 0.00–0.07)
Basophils Absolute: 0.1 10*3/uL (ref 0.0–0.1)
Basophils Relative: 0 %
Eosinophils Absolute: 0.1 10*3/uL (ref 0.0–0.5)
Eosinophils Relative: 1 %
HCT: 41.7 % (ref 39.0–52.0)
Hemoglobin: 13.9 g/dL (ref 13.0–17.0)
Immature Granulocytes: 18 %
Lymphocytes Relative: 12 %
Lymphs Abs: 2.5 10*3/uL (ref 0.7–4.0)
MCH: 29.7 pg (ref 26.0–34.0)
MCHC: 33.3 g/dL (ref 30.0–36.0)
MCV: 89.1 fL (ref 80.0–100.0)
Monocytes Absolute: 0.6 10*3/uL (ref 0.1–1.0)
Monocytes Relative: 3 %
Neutro Abs: 14.2 10*3/uL — ABNORMAL HIGH (ref 1.7–7.7)
Neutrophils Relative %: 66 %
Platelet Count: 172 10*3/uL (ref 150–400)
RBC: 4.68 MIL/uL (ref 4.22–5.81)
RDW: 16.1 % — ABNORMAL HIGH (ref 11.5–15.5)
WBC Count: 21.3 10*3/uL — ABNORMAL HIGH (ref 4.0–10.5)
nRBC: 0.3 % — ABNORMAL HIGH (ref 0.0–0.2)

## 2021-07-07 LAB — CMP (CANCER CENTER ONLY)
ALT: 40 U/L (ref 0–44)
AST: 17 U/L (ref 15–41)
Albumin: 3.8 g/dL (ref 3.5–5.0)
Alkaline Phosphatase: 73 U/L (ref 38–126)
Anion gap: 9 (ref 5–15)
BUN: 14 mg/dL (ref 6–20)
CO2: 27 mmol/L (ref 22–32)
Calcium: 9.4 mg/dL (ref 8.9–10.3)
Chloride: 99 mmol/L (ref 98–111)
Creatinine: 1.07 mg/dL (ref 0.61–1.24)
GFR, Estimated: >60 mL/min (ref >60)
Glucose, Bld: 142 mg/dL — ABNORMAL HIGH (ref 70–99)
Potassium: 4.2 mmol/L (ref 3.5–5.1)
Sodium: 135 mmol/L (ref 135–145)
Total Bilirubin: 0.3 mg/dL (ref 0.3–1.2)
Total Protein: 6.9 g/dL (ref 6.5–8.1)

## 2021-07-07 LAB — TSH: TSH: 0.737 u[IU]/mL (ref 0.320–4.118)

## 2021-07-07 NOTE — Progress Notes (Signed)
Hematology and Oncology Follow Up Visit  Kevin Hunter [id number] 1965-08-28 55 y.o. 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  He has responded nicely to the Prednisone and there is very little rash left only on the left ankle and top of his foot. No itching or burning.  No fever, chills, n/v, cough, rash, SOB, chest pain, palpitations, abdominal pain or changes in bowel or bladder habits.  He has occasional lightheadedness.  No swelling or tenderness in his extremities.  He has noted some numbness in the bottoms of his feet but states that this is mild and tolerable. It has not effected his balance or mobility.  No falls or syncope to report.  He is eating well and staying properly hydrated throughout the day. His weight is stable at 291 lbs.   ECOG Performance Status: 1 - Symptomatic but completely ambulatory  Medications:  Allergies as of 07/07/2021       Reactions   Penicillins Other (See Comments)   Unknown Reaction when he was an infant.        Medication List        Accurate as of July 07, 2021 10:20 AM. If you have any questions, ask your nurse or doctor.          amLODipine 5 MG tablet Commonly known as: NORVASC Take 5 mg by mouth at bedtime.   clonazePAM 1 MG tablet Commonly known as: KLONOPIN Take 1 mg by mouth 2 (two) times daily as needed for anxiety (May take additional 0.5mg  as needed for panic attack).   dexamethasone 4 MG tablet Commonly known as: DECADRON Take 1 tablet (4 mg total) by mouth 2 (two) times daily.    dexamethasone 4 MG tablet Commonly known as: DECADRON Take 2 tablets (8 mg total) by mouth daily. Start the day after carboplatin chemotherapy for 3 days.   ibuprofen 200 MG tablet Commonly known as: ADVIL Take 800 mg by mouth every 6 (six) hours as needed for headache.   levETIRAcetam 500 MG tablet Commonly known as: KEPPRA Take 500 mg by mouth 2 (two) times daily.   lidocaine-prilocaine cream Commonly known as: EMLA Apply to affected area once   ondansetron 8 MG tablet Commonly known as: Zofran Take 1 tablet (8 mg total) by mouth 2 (two) times daily as needed for refractory nausea / vomiting. Start on day 3 after carboplatin chemo.   predniSONE 20 MG tablet Commonly known as: DELTASONE Take 4 tablets (80 mg total) by mouth daily with breakfast.   prochlorperazine 10 MG tablet Commonly known as: COMPAZINE Take 1 tablet (10 mg total) by mouth every 6 (six) hours as needed (Nausea or vomiting).   pseudoephedrine 30 MG tablet Commonly known as: SUDAFED Take 60 mg by mouth every 4 (four) hours as needed (headache).   psyllium 58.6 % packet Commonly known as: METAMUCIL Take 1 packet by mouth daily.   QUEtiapine 400 MG 24 hr tablet Commonly known as: SEROQUEL XR Take 400 mg by mouth at bedtime.   tolterodine 4 MG 24 hr capsule Commonly known as: DETROL  LA Take 4 mg by mouth at bedtime.   traZODone 100 MG tablet Commonly known as: DESYREL Take 200 mg by mouth at bedtime.   venlafaxine 75 MG tablet Commonly known as: EFFEXOR Take 150 mg by mouth at bedtime.        Allergies:  Allergies  Allergen Reactions   Penicillins Other (See Comments)    Unknown Reaction when he was an infant.    Past Medical History, Surgical history, Social history, and Family History were reviewed and updated.  Review of Systems: All other 10 point review of systems is negative.   Physical Exam:  vitals were not taken for this visit.   Wt Readings from Last 3 Encounters:   06/15/21 295 lb (133.8 kg)  06/05/21 294 lb (133.4 kg)  05/28/21 290 lb 3.2 oz (131.6 kg)    Ocular: Sclerae unicteric, pupils equal, round and reactive to light Ear-nose-throat: Oropharynx clear, dentition fair Lymphatic: No cervical or supraclavicular adenopathy Lungs no rales or rhonchi, good excursion bilaterally Heart regular rate and rhythm, no murmur appreciated Abd soft, nontender, positive bowel sounds MSK no focal spinal tenderness, no joint edema Neuro: non-focal, well-oriented, appropriate affect Breasts: Deferred   Lab Results  Component Value Date   WBC 10.0 06/25/2021   HGB 14.3 06/25/2021   HCT 43.2 06/25/2021   MCV 90.6 06/25/2021   PLT 168 06/25/2021   No results found for: FERRITIN, IRON, TIBC, UIBC, IRONPCTSAT Lab Results  Component Value Date   RBC 4.77 06/25/2021   No results found for: KPAFRELGTCHN, LAMBDASER, KAPLAMBRATIO No results found for: IGGSERUM, IGA, IGMSERUM No results found for: Odetta Pink, SPEI   Chemistry      Component Value Date/Time   NA 138 06/25/2021 0833   K 3.5 06/25/2021 0833   CL 100 06/25/2021 0833   CO2 29 06/25/2021 0833   BUN 13 06/25/2021 0833   CREATININE 1.06 06/25/2021 0833      Component Value Date/Time   CALCIUM 9.2 06/25/2021 0833   ALKPHOS 64 06/25/2021 0833   AST 16 06/25/2021 0833   ALT 30 06/25/2021 0833   BILITOT 0.4 06/25/2021 0833       Impression and Plan: Kevin Hunter is a very pleasant 55 yo caucasian gentleman with 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  His rash has almost completely resolved on prednisone.  We will now have him taper starting tomorrow taking 2 tablets for 3 days, 1 tablet for 3 days and then drop to 1/2 tablet until we see him back.  He has a follow-up next week and cycle 2 of treatment. Dr. Marin Olp also plans to drop Yervoy from his treatment plan. I have let Lattie Haw in pharmacy know.  We will repeat a PET  scan after cycle 2.  He is also due for MRI of the brain again in December with rad onc.  He can contact our office with any questions or concerns.   Lottie Dawson, NP 11/22/202210:20 AM

## 2021-07-08 DIAGNOSIS — C7931 Secondary malignant neoplasm of brain: Secondary | ICD-10-CM | POA: Diagnosis not present

## 2021-07-08 DIAGNOSIS — Z7952 Long term (current) use of systemic steroids: Secondary | ICD-10-CM | POA: Diagnosis not present

## 2021-07-08 DIAGNOSIS — Z79899 Other long term (current) drug therapy: Secondary | ICD-10-CM | POA: Diagnosis not present

## 2021-07-08 DIAGNOSIS — Z5111 Encounter for antineoplastic chemotherapy: Secondary | ICD-10-CM | POA: Diagnosis present

## 2021-07-08 DIAGNOSIS — Z5189 Encounter for other specified aftercare: Secondary | ICD-10-CM | POA: Diagnosis not present

## 2021-07-08 DIAGNOSIS — Z5112 Encounter for antineoplastic immunotherapy: Secondary | ICD-10-CM | POA: Diagnosis present

## 2021-07-08 DIAGNOSIS — C3411 Malignant neoplasm of upper lobe, right bronchus or lung: Secondary | ICD-10-CM | POA: Diagnosis present

## 2021-07-08 DIAGNOSIS — C7970 Secondary malignant neoplasm of unspecified adrenal gland: Secondary | ICD-10-CM | POA: Diagnosis not present

## 2021-07-08 LAB — T4: T4, Total: 8.7 ug/dL (ref 4.5–12.0)

## 2021-07-11 ENCOUNTER — Encounter: Payer: Self-pay | Admitting: Hematology & Oncology

## 2021-07-14 ENCOUNTER — Inpatient Hospital Stay: Payer: Managed Care, Other (non HMO)

## 2021-07-14 ENCOUNTER — Emergency Department (HOSPITAL_BASED_OUTPATIENT_CLINIC_OR_DEPARTMENT_OTHER)
Admission: EM | Admit: 2021-07-14 | Discharge: 2021-07-14 | Disposition: A | Discharge status: Home / Self Care | Payer: Self-pay | Attending: Emergency Medicine | Admitting: Emergency Medicine

## 2021-07-14 ENCOUNTER — Emergency Department (HOSPITAL_BASED_OUTPATIENT_CLINIC_OR_DEPARTMENT_OTHER): Payer: Self-pay

## 2021-07-14 ENCOUNTER — Other Ambulatory Visit: Payer: Self-pay

## 2021-07-14 ENCOUNTER — Other Ambulatory Visit: Payer: Self-pay | Admitting: Family

## 2021-07-14 ENCOUNTER — Encounter: Payer: Self-pay | Admitting: *Deleted

## 2021-07-14 ENCOUNTER — Encounter (HOSPITAL_BASED_OUTPATIENT_CLINIC_OR_DEPARTMENT_OTHER): Payer: Self-pay

## 2021-07-14 ENCOUNTER — Encounter: Payer: Self-pay | Admitting: Hematology & Oncology

## 2021-07-14 ENCOUNTER — Inpatient Hospital Stay (HOSPITAL_BASED_OUTPATIENT_CLINIC_OR_DEPARTMENT_OTHER): Payer: Managed Care, Other (non HMO) | Admitting: Hematology & Oncology

## 2021-07-14 VITALS — BP 118/84 | HR 102 | Temp 97.9°F | Resp 18 | Wt 297.0 lb

## 2021-07-14 VITALS — BP 187/122 | HR 131 | Temp 98.0°F | Resp 32

## 2021-07-14 DIAGNOSIS — R918 Other nonspecific abnormal finding of lung field: Secondary | ICD-10-CM

## 2021-07-14 DIAGNOSIS — Z85118 Personal history of other malignant neoplasm of bronchus and lung: Secondary | ICD-10-CM | POA: Insufficient documentation

## 2021-07-14 DIAGNOSIS — R109 Unspecified abdominal pain: Secondary | ICD-10-CM | POA: Insufficient documentation

## 2021-07-14 DIAGNOSIS — Z79899 Other long term (current) drug therapy: Secondary | ICD-10-CM | POA: Diagnosis not present

## 2021-07-14 DIAGNOSIS — C3411 Malignant neoplasm of upper lobe, right bronchus or lung: Secondary | ICD-10-CM | POA: Diagnosis present

## 2021-07-14 DIAGNOSIS — C3491 Malignant neoplasm of unspecified part of right bronchus or lung: Secondary | ICD-10-CM

## 2021-07-14 DIAGNOSIS — E032 Hypothyroidism due to medicaments and other exogenous substances: Secondary | ICD-10-CM

## 2021-07-14 DIAGNOSIS — R110 Nausea: Secondary | ICD-10-CM | POA: Insufficient documentation

## 2021-07-14 DIAGNOSIS — D72829 Elevated white blood cell count, unspecified: Secondary | ICD-10-CM | POA: Insufficient documentation

## 2021-07-14 DIAGNOSIS — R0602 Shortness of breath: Secondary | ICD-10-CM | POA: Insufficient documentation

## 2021-07-14 DIAGNOSIS — Z5189 Encounter for other specified aftercare: Secondary | ICD-10-CM | POA: Diagnosis not present

## 2021-07-14 DIAGNOSIS — Z87891 Personal history of nicotine dependence: Secondary | ICD-10-CM | POA: Insufficient documentation

## 2021-07-14 DIAGNOSIS — C7931 Secondary malignant neoplasm of brain: Secondary | ICD-10-CM | POA: Diagnosis not present

## 2021-07-14 DIAGNOSIS — Z20822 Contact with and (suspected) exposure to covid-19: Secondary | ICD-10-CM | POA: Insufficient documentation

## 2021-07-14 DIAGNOSIS — Z7952 Long term (current) use of systemic steroids: Secondary | ICD-10-CM | POA: Diagnosis not present

## 2021-07-14 DIAGNOSIS — T782XXA Anaphylactic shock, unspecified, initial encounter: Secondary | ICD-10-CM | POA: Insufficient documentation

## 2021-07-14 DIAGNOSIS — I10 Essential (primary) hypertension: Secondary | ICD-10-CM | POA: Insufficient documentation

## 2021-07-14 DIAGNOSIS — C7970 Secondary malignant neoplasm of unspecified adrenal gland: Secondary | ICD-10-CM | POA: Diagnosis not present

## 2021-07-14 DIAGNOSIS — R42 Dizziness and giddiness: Secondary | ICD-10-CM | POA: Insufficient documentation

## 2021-07-14 DIAGNOSIS — R748 Abnormal levels of other serum enzymes: Secondary | ICD-10-CM

## 2021-07-14 DIAGNOSIS — Z5112 Encounter for antineoplastic immunotherapy: Secondary | ICD-10-CM | POA: Diagnosis not present

## 2021-07-14 DIAGNOSIS — R61 Generalized hyperhidrosis: Secondary | ICD-10-CM | POA: Insufficient documentation

## 2021-07-14 DIAGNOSIS — Z5111 Encounter for antineoplastic chemotherapy: Secondary | ICD-10-CM | POA: Diagnosis present

## 2021-07-14 LAB — CBC WITH DIFFERENTIAL/PLATELET
Abs Immature Granulocytes: 3.16 10*3/uL — ABNORMAL HIGH (ref 0.00–0.07)
Basophils Absolute: 0.1 10*3/uL (ref 0.0–0.1)
Basophils Relative: 0 %
Eosinophils Absolute: 0 10*3/uL (ref 0.0–0.5)
Eosinophils Relative: 0 %
HCT: 59.3 % — ABNORMAL HIGH (ref 39.0–52.0)
Hemoglobin: 19 g/dL — ABNORMAL HIGH (ref 13.0–17.0)
Immature Granulocytes: 11 %
Lymphocytes Relative: 18 %
Lymphs Abs: 5.6 10*3/uL — ABNORMAL HIGH (ref 0.7–4.0)
MCH: 29.5 pg (ref 26.0–34.0)
MCHC: 32 g/dL (ref 30.0–36.0)
MCV: 91.9 fL (ref 80.0–100.0)
Monocytes Absolute: 0.4 10*3/uL (ref 0.1–1.0)
Monocytes Relative: 1 %
Neutro Abs: 21 10*3/uL — ABNORMAL HIGH (ref 1.7–7.7)
Neutrophils Relative %: 70 %
Platelets: 229 10*3/uL (ref 150–400)
RBC: 6.45 MIL/uL — ABNORMAL HIGH (ref 4.22–5.81)
RDW: 19.4 % — ABNORMAL HIGH (ref 11.5–15.5)
WBC Morphology: ABNORMAL
WBC: 30.2 10*3/uL — ABNORMAL HIGH (ref 4.0–10.5)
nRBC: 0.5 % — ABNORMAL HIGH (ref 0.0–0.2)

## 2021-07-14 LAB — CMP (CANCER CENTER ONLY)
ALT: 33 U/L (ref 0–44)
AST: 15 U/L (ref 15–41)
Albumin: 3.8 g/dL (ref 3.5–5.0)
Alkaline Phosphatase: 67 U/L (ref 38–126)
Anion gap: 7 (ref 5–15)
BUN: 15 mg/dL (ref 6–20)
CO2: 31 mmol/L (ref 22–32)
Calcium: 9.1 mg/dL (ref 8.9–10.3)
Chloride: 98 mmol/L (ref 98–111)
Creatinine: 1.03 mg/dL (ref 0.61–1.24)
GFR, Estimated: >60 mL/min (ref >60)
Glucose, Bld: 92 mg/dL (ref 70–99)
Potassium: 3.7 mmol/L (ref 3.5–5.1)
Sodium: 136 mmol/L (ref 135–145)
Total Bilirubin: 0.4 mg/dL (ref 0.3–1.2)
Total Protein: 6.5 g/dL (ref 6.5–8.1)

## 2021-07-14 LAB — CBC WITH DIFFERENTIAL (CANCER CENTER ONLY)
Abs Immature Granulocytes: 0.94 10*3/uL — ABNORMAL HIGH (ref 0.00–0.07)
Basophils Absolute: 0.2 10*3/uL — ABNORMAL HIGH (ref 0.0–0.1)
Basophils Relative: 1 %
Eosinophils Absolute: 0.1 10*3/uL (ref 0.0–0.5)
Eosinophils Relative: 1 %
HCT: 40.2 % (ref 39.0–52.0)
Hemoglobin: 13.3 g/dL (ref 13.0–17.0)
Immature Granulocytes: 6 %
Lymphocytes Relative: 18 %
Lymphs Abs: 2.7 10*3/uL (ref 0.7–4.0)
MCH: 29.7 pg (ref 26.0–34.0)
MCHC: 33.1 g/dL (ref 30.0–36.0)
MCV: 89.7 fL (ref 80.0–100.0)
Monocytes Absolute: 1.5 10*3/uL — ABNORMAL HIGH (ref 0.1–1.0)
Monocytes Relative: 10 %
Neutro Abs: 9.9 10*3/uL — ABNORMAL HIGH (ref 1.7–7.7)
Neutrophils Relative %: 64 %
Platelet Count: 176 10*3/uL (ref 150–400)
RBC: 4.48 MIL/uL (ref 4.22–5.81)
RDW: 17.7 % — ABNORMAL HIGH (ref 11.5–15.5)
WBC Count: 15.3 10*3/uL — ABNORMAL HIGH (ref 4.0–10.5)
nRBC: 0.1 % (ref 0.0–0.2)

## 2021-07-14 LAB — COMPREHENSIVE METABOLIC PANEL
ALT: 33 U/L (ref 0–44)
AST: 26 U/L (ref 15–41)
Albumin: 3.1 g/dL — ABNORMAL LOW (ref 3.5–5.0)
Alkaline Phosphatase: 84 U/L (ref 38–126)
Anion gap: 14 (ref 5–15)
BUN: 15 mg/dL (ref 6–20)
CO2: 22 mmol/L (ref 22–32)
Calcium: 8.1 mg/dL — ABNORMAL LOW (ref 8.9–10.3)
Chloride: 96 mmol/L — ABNORMAL LOW (ref 98–111)
Creatinine, Ser: 1.23 mg/dL (ref 0.61–1.24)
GFR, Estimated: >60 mL/min (ref >60)
Glucose, Bld: 240 mg/dL — ABNORMAL HIGH (ref 70–99)
Potassium: 4.3 mmol/L (ref 3.5–5.1)
Sodium: 132 mmol/L — ABNORMAL LOW (ref 135–145)
Total Bilirubin: 0.6 mg/dL (ref 0.3–1.2)
Total Protein: 6.3 g/dL — ABNORMAL LOW (ref 6.5–8.1)

## 2021-07-14 LAB — RESP PANEL BY RT-PCR (FLU A&B, COVID) ARPGX2
Influenza A by PCR: NEGATIVE
Influenza B by PCR: NEGATIVE
SARS Coronavirus 2 by RT PCR: NEGATIVE

## 2021-07-14 LAB — TROPONIN I (HIGH SENSITIVITY): Troponin I (High Sensitivity): 15 ng/L (ref <18)

## 2021-07-14 LAB — LIPASE, BLOOD: Lipase: 377 U/L — ABNORMAL HIGH (ref 11–51)

## 2021-07-14 LAB — LACTATE DEHYDROGENASE: LDH: 339 U/L — ABNORMAL HIGH (ref 98–192)

## 2021-07-14 LAB — TSH: TSH: 2.867 u[IU]/mL (ref 0.350–4.500)

## 2021-07-14 MED ORDER — ONDANSETRON HCL 4 MG/2ML IJ SOLN
4.0000 mg | Freq: Once | INTRAMUSCULAR | Status: AC
  Administered 2021-07-14: 4 mg via INTRAVENOUS

## 2021-07-14 MED ORDER — SODIUM CHLORIDE 0.9 % IV SOLN: Freq: Once | INTRAVENOUS | Status: AC

## 2021-07-14 MED ORDER — EPINEPHRINE 0.3 MG/0.3ML IJ SOAJ
0.3000 mg | Freq: Once | INTRAMUSCULAR | Status: DC | PRN
  Filled 2021-07-14: qty 0.6

## 2021-07-14 MED ORDER — DIPHENHYDRAMINE HCL 50 MG/ML IJ SOLN
50.0000 mg | Freq: Once | INTRAMUSCULAR | Status: AC
  Administered 2021-07-14: 50 mg via INTRAVENOUS

## 2021-07-14 MED ORDER — SODIUM CHLORIDE 0.9% FLUSH: 10.0000 mL | INTRAVENOUS | Status: DC | PRN

## 2021-07-14 MED ORDER — PALONOSETRON HCL INJECTION 0.25 MG/5ML
0.2500 mg | Freq: Once | INTRAVENOUS | Status: AC
  Administered 2021-07-14: 0.25 mg via INTRAVENOUS

## 2021-07-14 MED ORDER — EPINEPHRINE 0.3 MG/0.3ML IJ SOAJ: 0.3000 mg | INTRAMUSCULAR | 0 refills | Status: DC | PRN

## 2021-07-14 MED ORDER — EPINEPHRINE 0.3 MG/0.3ML IJ SOAJ
0.3000 mg | Freq: Once | INTRAMUSCULAR | Status: AC
  Administered 2021-07-14: 0.3 mg via INTRAMUSCULAR

## 2021-07-14 MED ORDER — SODIUM CHLORIDE 0.9 % IV BOLUS
500.0000 mL | Freq: Once | INTRAVENOUS | Status: AC
  Administered 2021-07-14: 500 mL via INTRAVENOUS

## 2021-07-14 MED ORDER — METHYLPREDNISOLONE SODIUM SUCC 125 MG IJ SOLR
125.0000 mg | Freq: Once | INTRAMUSCULAR | Status: AC | PRN
  Administered 2021-07-14: 125 mg via INTRAVENOUS

## 2021-07-14 MED ORDER — PREDNISONE 10 MG PO TABS: 20.0000 mg | ORAL_TABLET | Freq: Every day | ORAL | 0 refills | Status: AC

## 2021-07-14 MED ORDER — SODIUM CHLORIDE 0.9 % IV SOLN: Freq: Once | INTRAVENOUS | Status: DC | PRN

## 2021-07-14 MED ORDER — SODIUM CHLORIDE 0.9 % IV SOLN
150.0000 mg | Freq: Once | INTRAVENOUS | Status: DC
  Filled 2021-07-14: qty 5

## 2021-07-14 MED ORDER — SODIUM CHLORIDE 0.9% FLUSH
10.0000 mL | Freq: Once | INTRAVENOUS | Status: AC
  Administered 2021-07-14: 10 mL

## 2021-07-14 MED ORDER — DOXYCYCLINE HYCLATE 100 MG PO CAPS: 100.0000 mg | ORAL_CAPSULE | Freq: Two times a day (BID) | ORAL | 0 refills | Status: AC

## 2021-07-14 MED ORDER — ALBUTEROL SULFATE (2.5 MG/3ML) 0.083% IN NEBU: 2.5000 mg | INHALATION_SOLUTION | Freq: Once | RESPIRATORY_TRACT | Status: DC | PRN

## 2021-07-14 MED ORDER — SODIUM CHLORIDE 0.9 % IV SOLN
200.0000 mg/m2 | Freq: Once | INTRAVENOUS | Status: AC
  Administered 2021-07-14: 516 mg via INTRAVENOUS
  Filled 2021-07-14: qty 86

## 2021-07-14 MED ORDER — HEPARIN SOD (PORK) LOCK FLUSH 100 UNIT/ML IV SOLN: 500.0000 [IU] | Freq: Once | INTRAVENOUS | Status: DC | PRN

## 2021-07-14 MED ORDER — FAMOTIDINE IN NACL 20-0.9 MG/50ML-% IV SOLN
20.0000 mg | Freq: Once | INTRAVENOUS | Status: AC | PRN
  Administered 2021-07-14: 20 mg via INTRAVENOUS

## 2021-07-14 MED ORDER — IOHEXOL 350 MG/ML SOLN
100.0000 mL | Freq: Once | INTRAVENOUS | Status: AC | PRN
  Administered 2021-07-14: 100 mL via INTRAVENOUS

## 2021-07-14 MED ORDER — DIPHENHYDRAMINE HCL 50 MG/ML IJ SOLN
50.0000 mg | Freq: Once | INTRAMUSCULAR | Status: AC | PRN
  Administered 2021-07-14: 50 mg via INTRAVENOUS

## 2021-07-14 MED ORDER — SODIUM CHLORIDE 0.9 % IV SOLN
10.0000 mg | Freq: Once | INTRAVENOUS | Status: AC
  Administered 2021-07-14: 10 mg via INTRAVENOUS
  Filled 2021-07-14: qty 10

## 2021-07-14 MED ORDER — FAMOTIDINE 20 MG IN NS 100 ML IVPB
20.0000 mg | Freq: Once | INTRAVENOUS | Status: AC
  Administered 2021-07-14: 20 mg via INTRAVENOUS
  Filled 2021-07-14: qty 20

## 2021-07-14 MED ORDER — SODIUM CHLORIDE 0.9 % IV SOLN
900.0000 mg | Freq: Once | INTRAVENOUS | Status: DC
  Filled 2021-07-14: qty 90

## 2021-07-14 MED ORDER — SODIUM CHLORIDE 0.9 % IV SOLN
360.0000 mg | Freq: Once | INTRAVENOUS | Status: AC
  Administered 2021-07-14: 360 mg via INTRAVENOUS
  Filled 2021-07-14: qty 12

## 2021-07-14 MED ORDER — HEPARIN SOD (PORK) LOCK FLUSH 100 UNIT/ML IV SOLN
500.0000 [IU] | Freq: Once | INTRAVENOUS | Status: AC
  Administered 2021-07-14: 500 [IU]

## 2021-07-14 MED ORDER — SODIUM CHLORIDE 0.9 % IV BOLUS
1000.0000 mL | Freq: Once | INTRAVENOUS | Status: AC
  Administered 2021-07-14: 1000 mL via INTRAVENOUS

## 2021-07-14 NOTE — ED Provider Notes (Signed)
[facility] EMERGENCY DEPARTMENT Provider Note   CSN: [id number] Arrival date & time: 07/14/21  1147     History Chief Complaint  Patient presents with   Shortness of Breath    Kevin Hunter is a 55 y.o. male.  Patient was getting chemotherapy infusion when he finally got short of breath, lightheaded, sweaty, nauseous.  He was given steroids, antihistamines, Benadryl.  Denies any feeling of his throat closing, does feel slightly nauseous.  Does not have any hives.  Feels like his mouth is very dry.  He states last time he had his chemotherapy infusion he had more mild reaction but similar to this.  The history is provided by the patient.  Shortness of Breath Severity:  Severe Onset quality:  Sudden Duration:  10 minutes Timing:  Constant Progression:  Unchanged Chronicity:  New Associated symptoms: diaphoresis   Associated symptoms: no abdominal pain, no chest pain, no cough, no ear pain, no fever, no rash, no sore throat and no vomiting       Past Medical History:  Diagnosis Date   Anxiety    Bipolar 1 disorder ([place])    Goals of care, counseling/discussion 05/27/2021   Hypertension    Lung cancer, primary, with metastasis from lung to other site, right ([place]) 05/27/2021   Mood disorder ([place])    Sleep apnea     Patient Active Problem List   Diagnosis Date Noted   Lung cancer, primary, with metastasis from lung to other site, right ([place]) 05/27/2021   Goals of care, counseling/discussion 05/27/2021   Lung mass    Hyponatremia 05/21/2021   Bipolar 1 disorder ([place])    Anxiety    Hypertension     Past Surgical History:  Procedure Laterality Date   BRONCHIAL BIOPSY  05/22/2021   Procedure: BRONCHIAL BIOPSIES;  Surgeon: Garner Nash, DO;  Location: [facility];  Service: Pulmonary;;   BRONCHIAL BRUSHINGS  05/22/2021   Procedure: BRONCHIAL BRUSHINGS;  Surgeon: Garner Nash, DO;  Location: [facility];  Service: Pulmonary;;   BRONCHIAL NEEDLE  ASPIRATION BIOPSY  05/22/2021   Procedure: BRONCHIAL NEEDLE ASPIRATION BIOPSIES;  Surgeon: Garner Nash, DO;  Location: [facility];  Service: Pulmonary;;   IR IMAGING GUIDED PORT INSERTION  06/24/2021   VIDEO BRONCHOSCOPY WITH ENDOBRONCHIAL NAVIGATION N/A 05/22/2021   Procedure: VIDEO BRONCHOSCOPY WITH ENDOBRONCHIAL NAVIGATION;  Surgeon: Garner Nash, DO;  Location: [facility];  Service: Pulmonary;  Laterality: N/A;   VIDEO BRONCHOSCOPY WITH RADIAL ENDOBRONCHIAL ULTRASOUND  05/22/2021   Procedure: RADIAL ENDOBRONCHIAL ULTRASOUND;  Surgeon: Garner Nash, DO;  Location: [facility];  Service: Pulmonary;;       Family History  Problem Relation Age of Onset   Breast cancer Mother    Lung cancer Father     Social History   Tobacco Use   Smoking status: Former    Types: Cigarettes, E-cigarettes    Start date: 05/16/2018    Quit date: 05/16/2018    Years since quitting: 3.1   Smokeless tobacco: Never  Vaping Use   Vaping Use: Every day   Start date: 05/16/2018  Substance Use Topics   Alcohol use: Never   Drug use: Never    Home Medications Prior to Admission medications   Medication Sig Start Date End Date Taking? Authorizing Provider  amLODipine (NORVASC) 5 MG tablet Take 5 mg by mouth at bedtime.    [provider]  clonazePAM (KLONOPIN) 1 MG tablet Take 1 mg by mouth 2 (two) times  daily as needed for anxiety (May take additional 0.5mg  as needed for panic attack).    [provider]  dexamethasone (DECADRON) 4 MG tablet Take 1 tablet (4 mg total) by mouth 2 (two) times daily. 05/28/21   Hayden Pedro, PA-C  dexamethasone (DECADRON) 4 MG tablet Take 2 tablets (8 mg total) by mouth daily. Start the day after carboplatin chemotherapy for 3 days. 06/22/21   [name], MD  gabapentin (NEURONTIN) 300 MG capsule Take 300 mg by mouth daily.    [provider]  ibuprofen (ADVIL) 200 MG tablet Take 800 mg by mouth every 6 (six) hours as  needed for headache.    [provider]  LamoTRIgine 300 MG TB24 24 hour tablet Take 300 mg by mouth daily.    [provider]  levETIRAcetam (KEPPRA) 500 MG tablet Take 500 mg by mouth 2 (two) times daily. 06/05/21   [provider]  lidocaine-prilocaine (EMLA) cream Apply to affected area once 06/22/21   Ennever, Rudell Cobb, MD  ondansetron (ZOFRAN) 8 MG tablet Take 1 tablet (8 mg total) by mouth 2 (two) times daily as needed for refractory nausea / vomiting. Start on day 3 after carboplatin chemo. 06/22/21   [name], MD  predniSONE (DELTASONE) 20 MG tablet Take 4 tablets (80 mg total) by mouth daily with breakfast. 07/03/21   [name], MD  prochlorperazine (COMPAZINE) 10 MG tablet Take 1 tablet (10 mg total) by mouth every 6 (six) hours as needed (Nausea or vomiting). 06/22/21   [name], MD  pseudoephedrine (SUDAFED) 30 MG tablet Take 60 mg by mouth every 4 (four) hours as needed (headache).    [provider]  psyllium (METAMUCIL) 58.6 % packet Take 1 packet by mouth daily.    [provider]  QUEtiapine (SEROQUEL XR) 400 MG 24 hr tablet Take 400 mg by mouth at bedtime. 04/23/21   [provider]  tolterodine (DETROL LA) 4 MG 24 hr capsule Take 4 mg by mouth at bedtime. 01/25/21   [provider]  traZODone (DESYREL) 100 MG tablet Take 200 mg by mouth at bedtime.    [provider]  venlafaxine (EFFEXOR) 75 MG tablet Take 150 mg by mouth at bedtime. 04/24/21   [provider]    Allergies    Penicillins  Review of Systems   Review of Systems  Constitutional:  Positive for diaphoresis. Negative for chills and fever.  HENT:  Negative for ear pain and sore throat.   Eyes:  Negative for pain and visual disturbance.  Respiratory:  Positive for shortness of breath. Negative for cough.   Cardiovascular:  Negative for chest pain and palpitations.  Gastrointestinal:  Positive for nausea. Negative for  abdominal pain and vomiting.  Genitourinary:  Negative for dysuria and hematuria.  Musculoskeletal:  Negative for arthralgias and back pain.  Skin:  Negative for color change and rash.  Neurological:  Negative for seizures and syncope.  All other systems reviewed and are negative.  Physical Exam Updated Vital Signs BP 122/88   Pulse (!) 114   Temp 97.8 F (36.6 C) (Oral)   Resp 15   Ht 5\' 10"  (1.778 m)   Wt 126.6 kg   SpO2 98%   BMI 40.03 kg/m   Physical Exam Vitals and nursing note reviewed.  Constitutional:      General: He is in acute distress.     Appearance: He is well-developed. He is ill-appearing.  HENT:  Head: Normocephalic and atraumatic.     Mouth/Throat:     Mouth: Mucous membranes are moist.  Eyes:     Conjunctiva/sclera: Conjunctivae normal.     Pupils: Pupils are equal, round, and reactive to light.  Cardiovascular:     Rate and Rhythm: Normal rate and regular rhythm.     Pulses: Normal pulses.     Heart sounds: Normal heart sounds. No murmur heard. Pulmonary:     Effort: Pulmonary effort is normal. Tachypnea present. No respiratory distress.     Breath sounds: No decreased breath sounds, wheezing or rhonchi.  Abdominal:     Palpations: Abdomen is soft.     Tenderness: There is no abdominal tenderness.  Musculoskeletal:        General: No swelling. Normal range of motion.     Cervical back: Normal range of motion and neck supple.     Right lower leg: No edema.     Left lower leg: No edema.  Skin:    General: Skin is warm and dry.     Capillary Refill: Capillary refill takes less than 2 seconds.  Neurological:     Mental Status: He is alert.  Psychiatric:        Mood and Affect: Mood normal.    ED Results / Procedures / Treatments   Labs (all labs ordered are listed, but only abnormal results are displayed) Labs Reviewed  CBC WITH DIFFERENTIAL/PLATELET - Abnormal; Notable for the following components:      Result Value   WBC 30.2 (*)     RBC 6.45 (*)    Hemoglobin 19.0 (*)    HCT 59.3 (*)    RDW 19.4 (*)    nRBC 0.5 (*)    Neutro Abs 21.0 (*)    Lymphs Abs 5.6 (*)    Abs Immature Granulocytes 3.16 (*)    All other components within normal limits  COMPREHENSIVE METABOLIC PANEL - Abnormal; Notable for the following components:   Sodium 132 (*)    Chloride 96 (*)    Glucose, Bld 240 (*)    Calcium 8.1 (*)    Total Protein 6.3 (*)    Albumin 3.1 (*)    All other components within normal limits  LIPASE, BLOOD - Abnormal; Notable for the following components:   Lipase 377 (*)    All other components within normal limits  RESP PANEL BY RT-PCR (FLU A&B, COVID) ARPGX2  URINE CULTURE  URINALYSIS, ROUTINE W REFLEX MICROSCOPIC  TROPONIN I (HIGH SENSITIVITY)  TROPONIN I (HIGH SENSITIVITY)    EKG None  Radiology DG Chest Portable 1 View  Result Date: 07/14/2021 CLINICAL DATA:  Shortness of breath EXAM: PORTABLE CHEST 1 VIEW COMPARISON:  05/22/2021 FINDINGS: New right chest wall port catheter tip overlies SVC. Persistent right upper lobe opacity reflecting known malignancy. No new consolidation or edema. No pleural effusion or pneumothorax. Stable cardiomediastinal contours. IMPRESSION: No acute process in the chest. Electronically Signed   By: Macy Mis M.D.   On: 07/14/2021 12:26    Procedures .Critical Care Performed by: Lennice Sites, DO Authorized by: Lennice Sites, DO   Critical care provider statement:    Critical care time (minutes):  45   Critical care was necessary to treat or prevent imminent or life-threatening deterioration of the following conditions:  Shock   Critical care was time spent personally by me on the following activities:  Blood draw for specimens, development of treatment plan with patient or surrogate, discussions with consultants, evaluation  of patient's response to treatment, examination of patient, obtaining history from patient or surrogate, ordering and performing treatments and  interventions, ordering and review of laboratory studies, ordering and review of radiographic studies, pulse oximetry, re-evaluation of patient's condition and review of old charts   I assumed direction of critical care for this patient from another provider in my specialty: no     Medications Ordered in ED Medications  ondansetron (ZOFRAN) injection 4 mg (4 mg Intravenous Given by Other 07/14/21 1159)  sodium chloride 0.9 % bolus 1,000 mL (0 mLs Intravenous Stopped 07/14/21 1422)  iohexol (OMNIPAQUE) 350 MG/ML injection 100 mL (100 mLs Intravenous Contrast Given 07/14/21 1403)  sodium chloride 0.9 % bolus 500 mL (500 mLs Intravenous New Bag/Given 07/14/21 1424)  EPINEPHrine (EPI-PEN) injection 0.3 mg (0.3 mg Intramuscular Given 07/14/21 1338)    ED Course  I have reviewed the triage vital signs and the nursing notes.  Pertinent labs & imaging results that were available during my care of the patient were reviewed by me and considered in my medical decision making (see chart for details).    MDM Rules/Calculators/A&P                           Kevin Hunter is a 55 year old male with history of lung cancer with metastasis to his brain who presents the ED after sudden shortness of breath while getting chemotherapy infusion today.  Patient arrives tachycardic and mildly hypoxic with increased work of breathing and diaphoresis.  Blood pressure is in the 90s.  He was given Solu-Medrol, antihistamine, Benadryl prior to arrival.  He has no hives.  He has diminished breath sounds but no obvious wheezing.  There is no swelling of his tongue or lips.  His throat does feel scratchy.  He does feel slightly nauseous.  Ultimately decision was made to give him epinephrine IM.  He did have some low blood pressures prior to that which led to epinephrine use.  He has been given IV fluids.  Given concern for possible other process we will pursue CT scan of his chest to evaluate for PE.  He does have some  abdominal distention on exam and will get a CT scan of his abdomen and pelvis as well.  He does not have a fever.  He has been feeling well prior to going to infusion therapy today.  Have a lower concern for sepsis or infectious process.  Leukocytosis of 30.2.  Hemoglobin is 19.  COVID and flu test are negative.  Blood sugar is elevated but otherwise CMP is unremarkable.  Lipase elevated at 377.  Troponin 15.  EKG shows sinus tachycardia with no ischemic changes.  Chest x-ray with no obvious pneumonia or pneumothorax.  Awaiting CT images at this time.  Blood pressure has stabilized to 120/88.  Mildly tachycardic in the low 100s.  Symptomatically he appears to be improving.  Differential includes possible allergic reaction to his chemotherapy versus possibly PE or other intra-abdominal process.  He has had this chemotherapy recently and states that he felt hot and flushed and nauseous after he had it last week but today was more intense.  Symptoms and blood pressure have stabilized on epinephrine.  Talked on the phone with radiology regarding his CT scan of his chest, abdomen, pelvis.  They did not see any obvious blood clot or infectious process.  CT reports are pending at time of handoff to oncoming ED staff.  Talked with Dr.  Feng with oncology and overall feel comfortable that if patient does well can be sent home with prednisone for a few days for suspected allergic reaction.  Somewhat common with Taxol.  Would not pursue infectious work-up at this time given unremarkable imaging from an infection standpoint but will await final CT reads.  Leukocytosis likely in the setting of recent steroid use, chemotherapy, stress reaction.  Hemodynamics are improving following epinephrine.  We will continue to need observation post epinephrine to see if he has any change in his symptoms and vitals.  Please see oncoming ED provider's note for further results, evaluation, disposition of the patient.  This chart was  dictated using voice recognition software.  Despite best efforts to proofread,  errors can occur which can change the documentation meaning.   Final Clinical Impression(s) / ED Diagnoses Final diagnoses:  Anaphylaxis, initial encounter    Rx / DC Orders ED Discharge Orders     None        Lennice Sites, DO 07/14/21 1453

## 2021-07-14 NOTE — ED Triage Notes (Signed)
Pt arrives as [facility] from [facility] center in building today after receiving about 10 of a chemo treatment that he is on r/t metastatic cancer. Per staff, pt sat up c/o CP, SOB. Staff reports pt became diaphoretic. Upon arrival of ED staff to cancer center pt had received 125 Solumedrol, 25 mg benadryl, and 40 of pepcid IVPB through his right chest port that was accessed in cancer center.

## 2021-07-14 NOTE — Patient Instructions (Signed)
[facility] [place]  Discharge Instructions: Thank you for choosing [facility] to provide your oncology and hematology care.   If you have a lab appointment with the [facility], please go directly to the [facility] and check in at the registration area.  Wear comfortable clothing and clothing appropriate for easy access to any Portacath or PICC line.   We strive to give you quality time with your provider. You may need to reschedule your appointment if you arrive late (15 or more minutes).  Arriving late affects you and other patients whose appointments are after yours.  Also, if you miss three or more appointments without notifying the office, you may be dismissed from the clinic at the provider's discretion.      For prescription refill requests, have your pharmacy contact our office and allow 72 hours for refills to be completed.    Today you received the following chemotherapy and/or immunotherapy agents Taxol, Carbo, Opdivo      To help prevent nausea and vomiting after your treatment, we encourage you to take your nausea medication as directed.  BELOW ARE SYMPTOMS THAT SHOULD BE REPORTED IMMEDIATELY: *FEVER GREATER THAN 100.4 F (38 C) OR HIGHER *CHILLS OR SWEATING *NAUSEA AND VOMITING THAT IS NOT CONTROLLED WITH YOUR NAUSEA MEDICATION *UNUSUAL SHORTNESS OF BREATH *UNUSUAL BRUISING OR BLEEDING *URINARY PROBLEMS (pain or burning when urinating, or frequent urination) *BOWEL PROBLEMS (unusual diarrhea, constipation, pain near the anus) TENDERNESS IN MOUTH AND THROAT WITH OR WITHOUT PRESENCE OF ULCERS (sore throat, sores in mouth, or a toothache) UNUSUAL RASH, SWELLING OR PAIN  UNUSUAL VAGINAL DISCHARGE OR ITCHING   Items with * indicate a potential emergency and should be followed up as soon as possible or go to the Emergency Department if any problems should occur.  Please show the CHEMOTHERAPY ALERT CARD or IMMUNOTHERAPY ALERT CARD at  check-in to the Emergency Department and triage nurse. Should you have questions after your visit or need to cancel or reschedule your appointment, please contact [facility]  [phone] and follow the prompts.  Office hours are 8:00 a.m. to 4:30 p.m. Monday - Friday. Please note that voicemails left after 4:00 p.m. may not be returned until the following business day.  We are closed weekends and major holidays. You have access to a nurse at all times for urgent questions. Please call the main number to the clinic [phone] and follow the prompts.  For any non-urgent questions, you may also contact your provider using MyChart. We now offer e-Visits for anyone 34 and older to request care online for non-urgent symptoms. For details visit [email or web address].   Also download the MyChart app! Go to the app store, search "MyChart", open the app, select [facility], and log in with your MyChart username and password.  Due to Covid, a mask is required upon entering the hospital/clinic. If you do not have a mask, one will be given to you upon arrival. For doctor visits, patients may have 1 support person aged 27 or older with them. For treatment visits, patients cannot have anyone with them due to current Covid guidelines and our immunocompromised population.

## 2021-07-14 NOTE — ED Notes (Signed)
ED Provider at bedside. 

## 2021-07-14 NOTE — Progress Notes (Signed)
During his Taxol infusion, patient experienced a reaction. This required transportation to the ED for further assessment and observation.   Called and spoke to patient's brother, Cristie Hem and notified him of the reaction and patient's transport to the ED. He will come to the ED to be with his brother.   Oncology Nurse Navigator Documentation  Oncology Nurse Navigator Flowsheets 07/14/2021  Abnormal Finding Date -  Diagnosis Status -  Planned Course of Treatment -  Phase of Treatment -  Chemotherapy Pending- Reason: -  Chemotherapy Actual Start Date: -  Chemotherapy Expected End Date: -  Radiation Actual Start Date: -  Radiation Actual End Date: -  Navigator Follow Up Date: 07/29/2021  Navigator Follow Up Reason: Scan Review  Navigator Location [facility]  Referral Date to RadOnc/MedOnc -  Navigator Encounter Type Telephone  Telephone Outgoing Call  Treatment Initiated Date -  Patient Visit Type MedOnc  Treatment Phase Active Tx  Barriers/Navigation Needs Coordination of Care;Education  Education -  Interventions Psycho-Social Support  Acuity Level 2-Minimal Needs (1-2 Barriers Identified)  Coordination of Care -  Education Method Verbal  Support Groups/Services -  Time Spent with Patient 15

## 2021-07-14 NOTE — ED Notes (Signed)
XR at bedside

## 2021-07-14 NOTE — Patient Instructions (Signed)

## 2021-07-14 NOTE — Progress Notes (Signed)
1119 pt sat up in chair, chest pain, short of breath, face, neck chest red. Taxol stopped, NS to gravity. Emergency meds given. See MAR. MD at chairside. Pt vital signs unstable -see flowsheet. Rapid response called. Pt transferred to ED for further evaluation.

## 2021-07-14 NOTE — Progress Notes (Signed)
Patient is here for cycle 2. The rash he developed after his first cycle, has completely resolved. He is otherwise doing well. PET scan needed prior to his next cycle of treatment. Scheduled for 07/29/2021. Appointment date, time and location given to patient, as well as radiology info sheet with identical information. PET prep also given to patient.   Oncology Nurse Navigator Documentation  Oncology Nurse Navigator Flowsheets 07/14/2021  Abnormal Finding Date -  Diagnosis Status -  Planned Course of Treatment -  Phase of Treatment -  Chemotherapy Pending- Reason: -  Chemotherapy Actual Start Date: -  Chemotherapy Expected End Date: -  Radiation Actual Start Date: -  Radiation Actual End Date: -  Navigator Follow Up Date: 07/29/2021  Navigator Follow Up Reason: Scan Review  Navigator Location [facility]  Referral Date to RadOnc/MedOnc -  Navigator Encounter Type Treatment;Appt/Treatment Plan Review  Telephone -  Treatment Initiated Date -  Patient Visit Type MedOnc  Treatment Phase Active Tx  Barriers/Navigation Needs Coordination of Care;Education  Education Other  Interventions Coordination of Care;Education;Psycho-Social Support  Acuity Level 2-Minimal Needs (1-2 Barriers Identified)  Coordination of Care Radiology  Education Method Verbal;Written  Support Groups/Services Friends and Family  Time Spent with Patient 30

## 2021-07-14 NOTE — Progress Notes (Addendum)
Hematology and Oncology Follow Up Visit  Kevin Hunter [id number] [birth year] 55 y.o. 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  He has full cycle of chemotherapy which he did okay with although he developed a bad rash about a day afterwards.  I am sure of the rash probably was from the [facility].  We put him on prednisone.  The rash went away in 3 days.  He is tapering down the prednisone right now.  I told him to stay on 10 mg of prednisone a day.  Otherwise he felt okay.  He did not have any vomiting.  There may been a little bit of nausea.  He has had no diarrhea.  He has had no cough or shortness of breath..  There is been no headache.  He did have radiosurgery for his CNS disease.  He has had no problems with bleeding.  Has had no leg swelling.  Overall, I would say his performance status for now is ECOG 1.    Medications:  Current Outpatient Medications:    amLODipine (NORVASC) 5 MG tablet, Take 5 mg by mouth at bedtime., Disp: , Rfl:    clonazePAM (KLONOPIN) 1 MG tablet, Take 1 mg by mouth 2 (two) times daily as needed for anxiety (May take additional 0.5mg  as needed for panic attack)., Disp: , Rfl:    dexamethasone (DECADRON) 4 MG tablet, Take 1 tablet (4 mg total) by mouth 2 (two) times daily., Disp: 60 tablet, Rfl: 0   dexamethasone (DECADRON) 4 MG tablet, Take 2 tablets (8 mg total) by mouth daily. Start the day after carboplatin chemotherapy for 3 days., Disp: 30 tablet, Rfl: 1   gabapentin (NEURONTIN) 300 MG capsule, Take 300 mg by mouth daily., Disp: , Rfl:    ibuprofen (ADVIL) 200 MG tablet, Take 800 mg by mouth every 6 (six) hours as needed for headache., Disp:  , Rfl:    LamoTRIgine 300 MG TB24 24 hour tablet, Take 300 mg by mouth daily., Disp: , Rfl:    levETIRAcetam (KEPPRA) 500 MG tablet, Take 500 mg by mouth 2 (two) times daily., Disp: , Rfl:    lidocaine-prilocaine (EMLA) cream, Apply to affected area once, Disp: 30 g, Rfl: 3   ondansetron (ZOFRAN) 8 MG tablet, Take 1 tablet (8 mg total) by mouth 2 (two) times daily as needed for refractory nausea / vomiting. Start on day 3 after carboplatin chemo., Disp: 30 tablet, Rfl: 1   predniSONE (DELTASONE) 20 MG tablet, Take 4 tablets (80 mg total) by mouth daily with breakfast., Disp: 120 tablet, Rfl: 0   prochlorperazine (COMPAZINE) 10 MG tablet, Take 1 tablet (10 mg total) by mouth every 6 (six) hours as needed (Nausea or vomiting)., Disp: 30 tablet, Rfl: 1   pseudoephedrine (SUDAFED) 30 MG tablet, Take 60 mg by mouth every 4 (four) hours as needed (headache)., Disp: , Rfl:    psyllium (METAMUCIL) 58.6 % packet, Take 1 packet by mouth daily., Disp: , Rfl:    QUEtiapine (SEROQUEL XR) 400 MG 24 hr tablet, Take 400 mg by mouth at bedtime., Disp: , Rfl:    tolterodine (DETROL LA) 4 MG 24 hr capsule, Take 4 mg by mouth at bedtime., Disp: ,  Rfl:    traZODone (DESYREL) 100 MG tablet, Take 200 mg by mouth at bedtime., Disp: , Rfl:    venlafaxine (EFFEXOR) 75 MG tablet, Take 150 mg by mouth at bedtime., Disp: , Rfl:   Allergies:  Allergies  Allergen Reactions   Penicillins Other (See Comments)    Unknown Reaction when he was an infant.    Past Medical History, Surgical history, Social history, and Family History were reviewed and updated.  Review of Systems: Review of Systems  Constitutional: Negative.   HENT:  Negative.    Eyes: Negative.   Respiratory: Negative.    Cardiovascular: Negative.   Gastrointestinal: Negative.   Endocrine: Negative.   Genitourinary: Negative.    Musculoskeletal: Negative.   Skin: Negative.   Neurological:  Positive for headaches.  Hematological: Negative.    Psychiatric/Behavioral: Negative.     Physical Exam:  weight is 297 lb (134.7 kg). His oral temperature is 97.9 F (36.6 C). His blood pressure is 118/84 and his pulse is 102 (abnormal). His respiration is 18 and oxygen saturation is 93%.   Wt Readings from Last 3 Encounters:  07/14/21 297 lb (134.7 kg)  07/07/21 291 lb (132 kg)  06/15/21 295 lb (133.8 kg)    Physical Exam Vitals reviewed.  HENT:     Head: Normocephalic and atraumatic.  Eyes:     Pupils: Pupils are equal, round, and reactive to light.  Cardiovascular:     Rate and Rhythm: Normal rate and regular rhythm.     Heart sounds: Normal heart sounds.  Pulmonary:     Effort: Pulmonary effort is normal.     Breath sounds: Normal breath sounds.  Abdominal:     General: Bowel sounds are normal.     Palpations: Abdomen is soft.  Musculoskeletal:        General: No tenderness or deformity. Normal range of motion.     Cervical back: Normal range of motion.  Lymphadenopathy:     Cervical: No cervical adenopathy.  Skin:    General: Skin is warm and dry.     Findings: No erythema or rash.  Neurological:     Mental Status: He is alert and oriented to person, place, and time.  Psychiatric:        Behavior: Behavior normal.        Thought Content: Thought content normal.        Judgment: Judgment normal.     Lab Results  Component Value Date   WBC 15.3 (H) 07/14/2021   HGB 13.3 07/14/2021   HCT 40.2 07/14/2021   MCV 89.7 07/14/2021   PLT 176 07/14/2021     Chemistry      Component Value Date/Time   NA 136 07/14/2021 0746   K 3.7 07/14/2021 0746   CL 98 07/14/2021 0746   CO2 31 07/14/2021 0746   BUN 15 07/14/2021 0746   CREATININE 1.03 07/14/2021 0746      Component Value Date/Time   CALCIUM 9.1 07/14/2021 0746   ALKPHOS 67 07/14/2021 0746   AST 15 07/14/2021 0746   ALT 33 07/14/2021 0746   BILITOT 0.4 07/14/2021 0746     Impression and Plan: Kevin Hunter is a very nice 55 year old white male.  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  He has had 1 cycle of chemotherapy.  We will proceed with a second cycle of chemotherapy today.  Again, we will drop  the [facility] and see how his rash does.Marland Kitchen  After this second cycle, we will go ahead and get him set up with a PET scan to see how well he has responded.  We will see what his molecular analysis shows.  We will get had to do this through a liquid biopsy.  I am just glad that the rash got better.  I have a good feeling that the treatment is working.  We will plan to get him back in another 3 weeks for his next treatment.   [name], MD 11/29/20228:41 AM

## 2021-07-14 NOTE — ED Provider Notes (Signed)
  Physical Exam  BP 125/80   Pulse (!) 108   Temp 97.8 F (36.6 C) (Oral)   Resp (!) 22   Ht 5\' 10"  (1.778 m)   Wt 126.6 kg   SpO2 91%   BMI 40.03 kg/m   Physical Exam  ED Course/Procedures     Procedures  MDM  Received care of patient from previous provider. Please see his note for prior hx, physical and care. Briefly, this is a 55yo male who presented with acute onset of dyspnea, nausea, abdominal discomfort, redness of neck and chest, hypotension while receiving taxol today.  Given his symptoms/risk factors, had CT PE study, CT abdomen pelvis and labs completed.    Clinically, he did not have symptoms prior to the taxol and had resolution of symptoms following epi, benadryl, steroids and now feels back to baseline.  Discussed abnoramlities on labs and imaging such as signs of mild pancreatitis. Discussed reasonable to observe in hospital but given clinically his presentation seems most consistent with anaphylaxis and he does not have any continuing abdominal pain after receiving epi pen, no abdominal tenderness, no pancreatic abnormality on CT, and with only mild elevation, feel it is not unreasonable to begin bland diet, follow up closely with Dr. Marin Olp with strict return precautions.  Has patchy opacity on CT, low clinical suspicion for pneumonia given no cough, resolution of dyspnea symptoms with epi pen, however will give rx for doxycycline in case this is early finding prior to clinical symptoms.  Has chronic leukocytosis which is worse today but is also on steroids.   Presentation most consistent with anaphylaxis secondary to taxol.   Given rx for epi pen, increase in steroids for 2 days.   Recommend continued close follow up with Dr. Marin Olp and strict return precautions given lipase elevation and lung opacity r/o pneumonia. Patient discharged in stable condition with understanding of reasons to return.         Gareth Morgan, MD 07/15/21 [phone]

## 2021-07-14 NOTE — Progress Notes (Signed)
RT went to rapid response in cancer center. When I arrived, patient pale and sweaty,was on 6L and SAT 93%. Placed on NRB upon arrival to room 14. SAT not 98% and breathing a bit easier. MD at bedside

## 2021-07-15 ENCOUNTER — Telehealth: Payer: Self-pay | Admitting: *Deleted

## 2021-07-15 ENCOUNTER — Other Ambulatory Visit: Payer: Self-pay | Admitting: Hematology & Oncology

## 2021-07-15 ENCOUNTER — Inpatient Hospital Stay: Payer: Managed Care, Other (non HMO)

## 2021-07-15 ENCOUNTER — Encounter: Payer: Self-pay | Admitting: Hematology & Oncology

## 2021-07-15 DIAGNOSIS — C3491 Malignant neoplasm of unspecified part of right bronchus or lung: Secondary | ICD-10-CM

## 2021-07-15 LAB — T4: T4, Total: 7.7 ug/dL (ref 4.5–12.0)

## 2021-07-15 NOTE — Telephone Encounter (Signed)
Per scheduling message Lorriane Shire - called and gave upcoming appointments - confirmed

## 2021-07-15 NOTE — Addendum Note (Signed)
Addended by: Burney Gauze R on: 07/15/2021 03:09 PM   Modules accepted: Orders

## 2021-07-19 ENCOUNTER — Encounter: Payer: Self-pay | Admitting: Hematology & Oncology

## 2021-07-20 ENCOUNTER — Ambulatory Visit
Admission: RE | Admit: 2021-07-20 | Discharge: 2021-07-20 | Disposition: A | Discharge status: Home / Self Care | Payer: Self-pay | Source: Ambulatory Visit | Attending: Radiation Oncology | Admitting: Radiation Oncology

## 2021-07-20 DIAGNOSIS — C3491 Malignant neoplasm of unspecified part of right bronchus or lung: Secondary | ICD-10-CM | POA: Insufficient documentation

## 2021-07-20 NOTE — Progress Notes (Signed)
  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  Interval Since Last Radiation:  6 weeks   06/12/2021 through 06/12/2021 SRS Treatment Site Technique Total Dose (Gy) Dose per Fx (Gy) Completed Fx Beam Energies  Brain: Each site was treated in 1 fraction to 20 Gy   PTV_1_RtFront87mm PTV_2_LtPariet66mm  PTV_3_LtFront45mm IMRT 20/20 20 1/1 6XFFF    Narrative:  The patient was contacted today for routine follow-up. During treatment he did very well with radiotherapy and did not have significant desquamation. He reports he completed his steroid taper. He did state he has been having persistent headaches similar to how he felt when he presented. No other neurologic symptoms are verbalized.  Impression/Plan: 1. 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 The patient has been doing well since completion of radiotherapy. We discussed that we would plan to proceed with , MRIs through the brain oncology program, but he will also continue to follow up with MRIs of the brain for with Dr. Marin Olp in medical oncology. I discussed this was likely rebound edema since tapering and now discontinuing steroids. I offered a low dose reinstitution of dexamethasone at 2 mg daily or twice daily with slower taper. He is not as interested in this at this time. I encouraged him to try OTC Excedrin prn for his symptoms as he desires to avoid redosing of steroids, but encouraged him to let us know if his symptoms worsen I would have a very low threshold to restart steroids. He is in agreement with this plan.     Carola Rhine, PAC

## 2021-07-21 ENCOUNTER — Other Ambulatory Visit: Payer: Self-pay

## 2021-07-21 ENCOUNTER — Other Ambulatory Visit (HOSPITAL_COMMUNITY): Payer: Self-pay

## 2021-07-21 ENCOUNTER — Other Ambulatory Visit: Payer: Self-pay | Admitting: *Deleted

## 2021-07-21 ENCOUNTER — Encounter: Payer: Self-pay | Admitting: Hematology & Oncology

## 2021-07-21 DIAGNOSIS — C3491 Malignant neoplasm of unspecified part of right bronchus or lung: Secondary | ICD-10-CM

## 2021-07-22 ENCOUNTER — Inpatient Hospital Stay: Payer: Self-pay

## 2021-07-22 ENCOUNTER — Other Ambulatory Visit: Payer: Self-pay

## 2021-07-22 ENCOUNTER — Inpatient Hospital Stay: Payer: Self-pay | Attending: Internal Medicine

## 2021-07-22 VITALS — BP 124/78 | HR 85 | Temp 97.8°F | Resp 17

## 2021-07-22 DIAGNOSIS — Z5112 Encounter for antineoplastic immunotherapy: Secondary | ICD-10-CM | POA: Insufficient documentation

## 2021-07-22 DIAGNOSIS — Z5111 Encounter for antineoplastic chemotherapy: Secondary | ICD-10-CM | POA: Insufficient documentation

## 2021-07-22 DIAGNOSIS — C7970 Secondary malignant neoplasm of unspecified adrenal gland: Secondary | ICD-10-CM | POA: Insufficient documentation

## 2021-07-22 DIAGNOSIS — C7931 Secondary malignant neoplasm of brain: Secondary | ICD-10-CM | POA: Insufficient documentation

## 2021-07-22 DIAGNOSIS — C3411 Malignant neoplasm of upper lobe, right bronchus or lung: Secondary | ICD-10-CM | POA: Insufficient documentation

## 2021-07-22 DIAGNOSIS — Z5189 Encounter for other specified aftercare: Secondary | ICD-10-CM | POA: Insufficient documentation

## 2021-07-22 DIAGNOSIS — C3491 Malignant neoplasm of unspecified part of right bronchus or lung: Secondary | ICD-10-CM

## 2021-07-22 LAB — CBC WITH DIFFERENTIAL (CANCER CENTER ONLY)
Abs Immature Granulocytes: 0.15 10*3/uL — ABNORMAL HIGH (ref 0.00–0.07)
Basophils Absolute: 0.1 10*3/uL (ref 0.0–0.1)
Basophils Relative: 1 %
Eosinophils Absolute: 0.2 10*3/uL (ref 0.0–0.5)
Eosinophils Relative: 3 %
HCT: 39.1 % (ref 39.0–52.0)
Hemoglobin: 12.9 g/dL — ABNORMAL LOW (ref 13.0–17.0)
Immature Granulocytes: 2 %
Lymphocytes Relative: 15 %
Lymphs Abs: 1.4 10*3/uL (ref 0.7–4.0)
MCH: 29.8 pg (ref 26.0–34.0)
MCHC: 33 g/dL (ref 30.0–36.0)
MCV: 90.3 fL (ref 80.0–100.0)
Monocytes Absolute: 0.7 10*3/uL (ref 0.1–1.0)
Monocytes Relative: 8 %
Neutro Abs: 6.5 10*3/uL (ref 1.7–7.7)
Neutrophils Relative %: 71 %
Platelet Count: 130 10*3/uL — ABNORMAL LOW (ref 150–400)
RBC: 4.33 MIL/uL (ref 4.22–5.81)
RDW: 18.2 % — ABNORMAL HIGH (ref 11.5–15.5)
WBC Count: 9.1 10*3/uL (ref 4.0–10.5)
nRBC: 0 % (ref 0.0–0.2)

## 2021-07-22 LAB — TSH: TSH: 3.26 u[IU]/mL (ref 0.320–4.118)

## 2021-07-22 LAB — LACTATE DEHYDROGENASE: LDH: 273 U/L — ABNORMAL HIGH (ref 98–192)

## 2021-07-22 LAB — CMP (CANCER CENTER ONLY)
ALT: 25 U/L (ref 0–44)
AST: 15 U/L (ref 15–41)
Albumin: 3.8 g/dL (ref 3.5–5.0)
Alkaline Phosphatase: 59 U/L (ref 38–126)
Anion gap: 8 (ref 5–15)
BUN: 14 mg/dL (ref 6–20)
CO2: 27 mmol/L (ref 22–32)
Calcium: 9.2 mg/dL (ref 8.9–10.3)
Chloride: 102 mmol/L (ref 98–111)
Creatinine: 1.08 mg/dL (ref 0.61–1.24)
GFR, Estimated: >60 mL/min (ref >60)
Glucose, Bld: 123 mg/dL — ABNORMAL HIGH (ref 70–99)
Potassium: 3.8 mmol/L (ref 3.5–5.1)
Sodium: 137 mmol/L (ref 135–145)
Total Bilirubin: 0.5 mg/dL (ref 0.3–1.2)
Total Protein: 6.6 g/dL (ref 6.5–8.1)

## 2021-07-22 MED ORDER — SODIUM CHLORIDE 0.9 % IV SOLN
740.0000 mg | Freq: Once | INTRAVENOUS | Status: AC
  Administered 2021-07-22: 740 mg via INTRAVENOUS
  Filled 2021-07-22: qty 74

## 2021-07-22 MED ORDER — PALONOSETRON HCL INJECTION 0.25 MG/5ML
0.2500 mg | Freq: Once | INTRAVENOUS | Status: AC
  Administered 2021-07-22: 0.25 mg via INTRAVENOUS
  Filled 2021-07-22: qty 5

## 2021-07-22 MED ORDER — SODIUM CHLORIDE 0.9% FLUSH
10.0000 mL | INTRAVENOUS | Status: DC | PRN
  Administered 2021-07-22: 10 mL

## 2021-07-22 MED ORDER — SODIUM CHLORIDE 0.9 % IV SOLN
10.0000 mg | Freq: Once | INTRAVENOUS | Status: AC
  Administered 2021-07-22: 10 mg via INTRAVENOUS
  Filled 2021-07-22: qty 10

## 2021-07-22 MED ORDER — SODIUM CHLORIDE 0.9 % IV SOLN: Freq: Once | INTRAVENOUS | Status: AC

## 2021-07-22 MED ORDER — HEPARIN SOD (PORK) LOCK FLUSH 100 UNIT/ML IV SOLN
500.0000 [IU] | Freq: Once | INTRAVENOUS | Status: AC | PRN
  Administered 2021-07-22: 500 [IU]

## 2021-07-22 MED ORDER — SODIUM CHLORIDE 0.9 % IV SOLN
150.0000 mg | Freq: Once | INTRAVENOUS | Status: AC
  Administered 2021-07-22: 150 mg via INTRAVENOUS
  Filled 2021-07-22: qty 150

## 2021-07-22 MED ORDER — SODIUM CHLORIDE 0.9 % IV SOLN: 240.0000 mg | Freq: Once | INTRAVENOUS | Status: DC

## 2021-07-22 MED ORDER — SODIUM CHLORIDE 0.9 % IV SOLN
67.5000 mg/m2 | Freq: Once | INTRAVENOUS | Status: AC
  Administered 2021-07-22: 170 mg via INTRAVENOUS
  Filled 2021-07-22: qty 17

## 2021-07-22 NOTE — Patient Instructions (Signed)
[facility]  Discharge Instructions: Thank you for choosing [facility] to provide your oncology and hematology care.   If you have a lab appointment with the [facility], please go directly to the [facility] and check in at the registration area.  Wear comfortable clothing and clothing appropriate for easy access to any Portacath or PICC line.   We strive to give you quality time with your provider. You may need to reschedule your appointment if you arrive late (15 or more minutes).  Arriving late affects you and other patients whose appointments are after yours.  Also, if you miss three or more appointments without notifying the office, you may be dismissed from the clinic at the provider's discretion.      For prescription refill requests, have your pharmacy contact our office and allow 72 hours for refills to be completed.    Today you received the following chemotherapy and/or immunotherapy agents  Opdivo,Taxotere, Carboplatin      To help prevent nausea and vomiting after your treatment, we encourage you to take your nausea medication as directed.  BELOW ARE SYMPTOMS THAT SHOULD BE REPORTED IMMEDIATELY: *FEVER GREATER THAN 100.4 F (38 C) OR HIGHER *CHILLS OR SWEATING *NAUSEA AND VOMITING THAT IS NOT CONTROLLED WITH YOUR NAUSEA MEDICATION *UNUSUAL SHORTNESS OF BREATH *UNUSUAL BRUISING OR BLEEDING *URINARY PROBLEMS (pain or burning when urinating, or frequent urination) *BOWEL PROBLEMS (unusual diarrhea, constipation, pain near the anus) TENDERNESS IN MOUTH AND THROAT WITH OR WITHOUT PRESENCE OF ULCERS (sore throat, sores in mouth, or a toothache) UNUSUAL RASH, SWELLING OR PAIN  UNUSUAL VAGINAL DISCHARGE OR ITCHING   Items with * indicate a potential emergency and should be followed up as soon as possible or go to the Emergency Department if any problems should occur.  Please show the CHEMOTHERAPY ALERT CARD or IMMUNOTHERAPY ALERT CARD  at check-in to the Emergency Department and triage nurse. Should you have questions after your visit or need to cancel or reschedule your appointment, please contact [facility]  [phone] and follow the prompts.  Office hours are 8:00 a.m. to 4:30 p.m. Monday - Friday. Please note that voicemails left after 4:00 p.m. may not be returned until the following business day.  We are closed weekends and major holidays. You have access to a nurse at all times for urgent questions. Please call the main number to the clinic [phone] and follow the prompts.  For any non-urgent questions, you may also contact your provider using MyChart. We now offer e-Visits for anyone 54 and older to request care online for non-urgent symptoms. For details visit [email or web address].   Also download the MyChart app! Go to the app store, search "MyChart", open the app, select [facility], and log in with your MyChart username and password.  Due to Covid, a mask is required upon entering the hospital/clinic. If you do not have a mask, one will be given to you upon arrival. For doctor visits, patients may have 1 support person aged 8 or older with them. For treatment visits, patients cannot have anyone with them due to current Covid guidelines and our immunocompromised population.

## 2021-07-24 ENCOUNTER — Other Ambulatory Visit: Payer: Self-pay

## 2021-07-24 ENCOUNTER — Inpatient Hospital Stay: Payer: Self-pay

## 2021-07-24 VITALS — BP 134/83 | HR 97 | Temp 98.7°F | Resp 17

## 2021-07-24 DIAGNOSIS — C3491 Malignant neoplasm of unspecified part of right bronchus or lung: Secondary | ICD-10-CM

## 2021-07-24 MED ORDER — PEGFILGRASTIM-CBQV 6 MG/0.6ML ~~LOC~~ SOSY
6.0000 mg | PREFILLED_SYRINGE | Freq: Once | SUBCUTANEOUS | Status: AC
  Administered 2021-07-24: 6 mg via SUBCUTANEOUS
  Filled 2021-07-24: qty 0.6

## 2021-07-24 NOTE — Patient Instructions (Signed)

## 2021-07-29 ENCOUNTER — Other Ambulatory Visit: Payer: Self-pay

## 2021-07-29 ENCOUNTER — Ambulatory Visit (HOSPITAL_COMMUNITY)
Admission: RE | Admit: 2021-07-29 | Discharge: 2021-07-29 | Disposition: A | Discharge status: Home / Self Care | Payer: Self-pay | Source: Ambulatory Visit | Attending: Hematology & Oncology | Admitting: Hematology & Oncology

## 2021-07-29 DIAGNOSIS — C3491 Malignant neoplasm of unspecified part of right bronchus or lung: Secondary | ICD-10-CM | POA: Insufficient documentation

## 2021-07-29 LAB — GLUCOSE, CAPILLARY: Glucose-Capillary: 95 mg/dL (ref 70–99)

## 2021-07-29 MED ORDER — FLUDEOXYGLUCOSE F - 18 (FDG) INJECTION
14.0600 | Freq: Once | INTRAVENOUS | Status: AC | PRN
  Administered 2021-07-29: 09:00:00 14.06 via INTRAVENOUS

## 2021-07-30 ENCOUNTER — Encounter: Payer: Self-pay | Admitting: *Deleted

## 2021-07-30 NOTE — Progress Notes (Signed)
Oncology Nurse Navigator Documentation  Oncology Nurse Navigator Flowsheets 07/30/2021  Abnormal Finding Date -  Diagnosis Status -  Planned Course of Treatment -  Phase of Treatment -  Chemotherapy Pending- Reason: -  Chemotherapy Actual Start Date: -  Chemotherapy Expected End Date: -  Radiation Actual Start Date: -  Radiation Actual End Date: -  Navigator Follow Up Date: 08/12/2021  Navigator Follow Up Reason: Follow-up Appointment;Chemotherapy  Navigator Location [facility]  Referral Date to RadOnc/MedOnc -  Navigator Encounter Type Scan Review  Telephone -  Treatment Initiated Date -  Patient Visit Type MedOnc  Treatment Phase Active Tx  Barriers/Navigation Needs Coordination of Care;Education  Education -  Interventions None Required  Acuity Level 2-Minimal Needs (1-2 Barriers Identified)  Coordination of Care -  Education Method -  Support Groups/Services -  Time Spent with Patient 15

## 2021-07-31 ENCOUNTER — Encounter: Payer: Self-pay | Admitting: *Deleted

## 2021-08-07 ENCOUNTER — Ambulatory Visit: Payer: Self-pay | Admitting: Hematology & Oncology

## 2021-08-07 ENCOUNTER — Other Ambulatory Visit: Payer: Self-pay

## 2021-08-07 ENCOUNTER — Inpatient Hospital Stay: Payer: Self-pay

## 2021-08-07 ENCOUNTER — Ambulatory Visit: Payer: Self-pay

## 2021-08-12 ENCOUNTER — Inpatient Hospital Stay: Payer: Self-pay

## 2021-08-12 ENCOUNTER — Encounter: Payer: Self-pay | Admitting: Hematology & Oncology

## 2021-08-12 ENCOUNTER — Other Ambulatory Visit: Payer: Self-pay

## 2021-08-12 ENCOUNTER — Inpatient Hospital Stay (HOSPITAL_BASED_OUTPATIENT_CLINIC_OR_DEPARTMENT_OTHER): Payer: Self-pay | Admitting: Hematology & Oncology

## 2021-08-12 ENCOUNTER — Encounter: Payer: Self-pay | Admitting: *Deleted

## 2021-08-12 DIAGNOSIS — C3491 Malignant neoplasm of unspecified part of right bronchus or lung: Secondary | ICD-10-CM

## 2021-08-12 LAB — CMP (CANCER CENTER ONLY)
ALT: 22 U/L (ref 0–44)
AST: 16 U/L (ref 15–41)
Albumin: 4 g/dL (ref 3.5–5.0)
Alkaline Phosphatase: 79 U/L (ref 38–126)
Anion gap: 7 (ref 5–15)
BUN: 12 mg/dL (ref 6–20)
CO2: 28 mmol/L (ref 22–32)
Calcium: 9.3 mg/dL (ref 8.9–10.3)
Chloride: 102 mmol/L (ref 98–111)
Creatinine: 1.06 mg/dL (ref 0.61–1.24)
GFR, Estimated: >60 mL/min (ref >60)
Glucose, Bld: 95 mg/dL (ref 70–99)
Potassium: 3.5 mmol/L (ref 3.5–5.1)
Sodium: 137 mmol/L (ref 135–145)
Total Bilirubin: 0.5 mg/dL (ref 0.3–1.2)
Total Protein: 7.1 g/dL (ref 6.5–8.1)

## 2021-08-12 LAB — CBC WITH DIFFERENTIAL (CANCER CENTER ONLY)
Abs Immature Granulocytes: 0.05 10*3/uL (ref 0.00–0.07)
Basophils Absolute: 0.1 10*3/uL (ref 0.0–0.1)
Basophils Relative: 1 %
Eosinophils Absolute: 0 10*3/uL (ref 0.0–0.5)
Eosinophils Relative: 0 %
HCT: 35.5 % — ABNORMAL LOW (ref 39.0–52.0)
Hemoglobin: 12 g/dL — ABNORMAL LOW (ref 13.0–17.0)
Immature Granulocytes: 1 %
Lymphocytes Relative: 22 %
Lymphs Abs: 1.5 10*3/uL (ref 0.7–4.0)
MCH: 31.3 pg (ref 26.0–34.0)
MCHC: 33.8 g/dL (ref 30.0–36.0)
MCV: 92.4 fL (ref 80.0–100.0)
Monocytes Absolute: 0.7 10*3/uL (ref 0.1–1.0)
Monocytes Relative: 11 %
Neutro Abs: 4.3 10*3/uL (ref 1.7–7.7)
Neutrophils Relative %: 65 %
Platelet Count: 175 10*3/uL (ref 150–400)
RBC: 3.84 MIL/uL — ABNORMAL LOW (ref 4.22–5.81)
RDW: 20.2 % — ABNORMAL HIGH (ref 11.5–15.5)
WBC Count: 6.6 10*3/uL (ref 4.0–10.5)
nRBC: 0 % (ref 0.0–0.2)

## 2021-08-12 LAB — TSH: TSH: 1.459 u[IU]/mL (ref 0.320–4.118)

## 2021-08-12 LAB — LACTATE DEHYDROGENASE: LDH: 233 U/L — ABNORMAL HIGH (ref 98–192)

## 2021-08-12 MED ORDER — SODIUM CHLORIDE 0.9 % IV SOLN
10.0000 mg | Freq: Once | INTRAVENOUS | Status: AC
  Administered 2021-08-12: 09:00:00 10 mg via INTRAVENOUS
  Filled 2021-08-12: qty 10

## 2021-08-12 MED ORDER — PALONOSETRON HCL INJECTION 0.25 MG/5ML
0.2500 mg | Freq: Once | INTRAVENOUS | Status: AC
  Administered 2021-08-12: 09:00:00 0.25 mg via INTRAVENOUS
  Filled 2021-08-12: qty 5

## 2021-08-12 MED ORDER — SODIUM CHLORIDE 0.9% FLUSH
10.0000 mL | INTRAVENOUS | Status: DC | PRN
  Administered 2021-08-12: 12:00:00 10 mL

## 2021-08-12 MED ORDER — HEPARIN SOD (PORK) LOCK FLUSH 100 UNIT/ML IV SOLN
500.0000 [IU] | Freq: Once | INTRAVENOUS | Status: AC | PRN
  Administered 2021-08-12: 12:00:00 500 [IU]

## 2021-08-12 MED ORDER — SODIUM CHLORIDE 0.9 % IV SOLN: Freq: Once | INTRAVENOUS | Status: AC

## 2021-08-12 MED ORDER — SODIUM CHLORIDE 0.9 % IV SOLN
240.0000 mg | Freq: Once | INTRAVENOUS | Status: AC
  Administered 2021-08-12: 10:00:00 240 mg via INTRAVENOUS
  Filled 2021-08-12: qty 24

## 2021-08-12 MED ORDER — SODIUM CHLORIDE 0.9 % IV SOLN
740.0000 mg | Freq: Once | INTRAVENOUS | Status: AC
  Administered 2021-08-12: 12:00:00 740 mg via INTRAVENOUS
  Filled 2021-08-12: qty 74

## 2021-08-12 MED ORDER — SODIUM CHLORIDE 0.9 % IV SOLN
150.0000 mg | Freq: Once | INTRAVENOUS | Status: AC
  Administered 2021-08-12: 09:00:00 150 mg via INTRAVENOUS
  Filled 2021-08-12: qty 150

## 2021-08-12 MED ORDER — SODIUM CHLORIDE 0.9 % IV SOLN
67.5000 mg/m2 | Freq: Once | INTRAVENOUS | Status: AC
  Administered 2021-08-12: 11:00:00 170 mg via INTRAVENOUS
  Filled 2021-08-12: qty 17

## 2021-08-12 NOTE — Patient Instructions (Signed)

## 2021-08-12 NOTE — Patient Instructions (Signed)
[facility]  Discharge Instructions: Thank you for choosing [facility] to provide your oncology and hematology care.   If you have a lab appointment with the [facility], please go directly to the [facility] and check in at the registration area.  Wear comfortable clothing and clothing appropriate for easy access to any Portacath or PICC line.   We strive to give you quality time with your provider. You may need to reschedule your appointment if you arrive late (15 or more minutes).  Arriving late affects you and other patients whose appointments are after yours.  Also, if you miss three or more appointments without notifying the office, you may be dismissed from the clinic at the providers discretion.      For prescription refill requests, have your pharmacy contact our office and allow 72 hours for refills to be completed.    Today you received the following chemotherapy and/or immunotherapy agents  Opdivo,Taxotere, Carboplatin      To help prevent nausea and vomiting after your treatment, we encourage you to take your nausea medication as directed.  BELOW ARE SYMPTOMS THAT SHOULD BE REPORTED IMMEDIATELY: *FEVER GREATER THAN 100.4 F (38 C) OR HIGHER *CHILLS OR SWEATING *NAUSEA AND VOMITING THAT IS NOT CONTROLLED WITH YOUR NAUSEA MEDICATION *UNUSUAL SHORTNESS OF BREATH *UNUSUAL BRUISING OR BLEEDING *URINARY PROBLEMS (pain or burning when urinating, or frequent urination) *BOWEL PROBLEMS (unusual diarrhea, constipation, pain near the anus) TENDERNESS IN MOUTH AND THROAT WITH OR WITHOUT PRESENCE OF ULCERS (sore throat, sores in mouth, or a toothache) UNUSUAL RASH, SWELLING OR PAIN  UNUSUAL VAGINAL DISCHARGE OR ITCHING   Items with * indicate a potential emergency and should be followed up as soon as possible or go to the Emergency Department if any problems should occur.  Please show the CHEMOTHERAPY ALERT CARD or IMMUNOTHERAPY ALERT CARD  at check-in to the Emergency Department and triage nurse. Should you have questions after your visit or need to cancel or reschedule your appointment, please contact [facility]  [phone] and follow the prompts.  Office hours are 8:00 a.m. to 4:30 p.m. Monday - Friday. Please note that voicemails left after 4:00 p.m. may not be returned until the following business day.  We are closed weekends and major holidays. You have access to a nurse at all times for urgent questions. Please call the main number to the clinic [phone] and follow the prompts.  For any non-urgent questions, you may also contact your provider using MyChart. We now offer e-Visits for anyone 24 and older to request care online for non-urgent symptoms. For details visit [email or web address].   Also download the MyChart app! Go to the app store, search "MyChart", open the app, select [facility], and log in with your MyChart username and password.  Due to Covid, a mask is required upon entering the hospital/clinic. If you do not have a mask, one will be given to you upon arrival. For doctor visits, patients may have 1 support person aged 38 or older with them. For treatment visits, patients cannot have anyone with them due to current Covid guidelines and our immunocompromised population.

## 2021-08-12 NOTE — Progress Notes (Signed)
Oncology Nurse Navigator Documentation  Oncology Nurse Navigator Flowsheets 08/12/2021  Abnormal Finding Date -  Diagnosis Status -  Planned Course of Treatment -  Phase of Treatment -  Chemotherapy Pending- Reason: -  Chemotherapy Actual Start Date: -  Chemotherapy Expected End Date: -  Radiation Actual Start Date: -  Radiation Actual End Date: -  Navigator Follow Up Date: 09/02/2021  Navigator Follow Up Reason: Follow-up Appointment;Chemotherapy  Navigator Location [facility]  Referral Date to RadOnc/MedOnc -  Navigator Encounter Type Treatment;Appt/Treatment Plan Review  Telephone -  Treatment Initiated Date -  Patient Visit Type MedOnc  Treatment Phase Active Tx  Barriers/Navigation Needs Coordination of Care;Education  Education -  Interventions Psycho-Social Support  Acuity Level 2-Minimal Needs (1-2 Barriers Identified)  Coordination of Care -  Education Method -  Support Groups/Services Friends and Family  Time Spent with Patient 15

## 2021-08-12 NOTE — Progress Notes (Signed)
Hematology and Oncology Follow Up Visit  Kevin Hunter [id number] 1965/09/22 55 y.o. 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  So far, he has had a very nice response to treatment.  Of note, we did take out the Taxol and put in the Taxotere after his first cycle because of an allergic reaction to the Taxol.  With the Taxotere/carboplatinum combination, he is responded nicely.  He has had a nice decrease in his lung primary in the right upper lobe.  He had a decrease in the mediastinal and abdominal adenopathy.  He is also a decrease in the right adrenal metastasis.  He had a nice Christmas.  He is eating well.  He has had no problems with nausea or vomiting.  He has had no change in bowel or bladder habits.  He has had no rash.  He says he has had some itchiness of the skin but again no rash.  We did drop Yervoy after the first cycle because of the rash.  He has had no leg swelling.  He has had no bleeding or bruising.  He has had no headache.  Overall, I would say his performance status is ECOG 1.    Medications:  Current Outpatient Medications:    amLODipine (NORVASC) 5 MG tablet, Take 5 mg by mouth at bedtime., Disp: , Rfl:    clonazePAM (KLONOPIN) 1 MG tablet, Take 1 mg by mouth 2 (two) times daily as needed for anxiety (May take additional 0.5mg  as needed for panic attack)., Disp: , Rfl:    dexamethasone (DECADRON) 4 MG tablet, Take 1 tablet (4 mg total) by mouth 2 (two) times daily., Disp: 60 tablet, Rfl: 0   EPINEPHrine 0.3 mg/0.3 mL IJ SOAJ injection, Inject 0.3 mg into the muscle as needed for anaphylaxis., Disp: 1 each, Rfl: 0   gabapentin (NEURONTIN) 300 MG  capsule, Take 300 mg by mouth daily., Disp: , Rfl:    ibuprofen (ADVIL) 200 MG tablet, Take 800 mg by mouth every 6 (six) hours as needed for headache., Disp: , Rfl:    LamoTRIgine 300 MG TB24 24 hour tablet, Take 300 mg by mouth daily., Disp: , Rfl:    levETIRAcetam (KEPPRA) 500 MG tablet, Take 500 mg by mouth 2 (two) times daily., Disp: , Rfl:    predniSONE (DELTASONE) 20 MG tablet, Take 4 tablets (80 mg total) by mouth daily with breakfast., Disp: 120 tablet, Rfl: 0   pseudoephedrine (SUDAFED) 30 MG tablet, Take 60 mg by mouth every 4 (four) hours as needed (headache)., Disp: , Rfl:    psyllium (METAMUCIL) 58.6 % packet, Take 1 packet by mouth daily., Disp: , Rfl:    QUEtiapine (SEROQUEL XR) 400 MG 24 hr tablet, Take 400 mg by mouth at bedtime., Disp: , Rfl:    tolterodine (DETROL LA) 4 MG 24 hr capsule, Take 4 mg by mouth at bedtime., Disp: , Rfl:    traZODone (DESYREL) 100 MG tablet, Take 200 mg by mouth at bedtime., Disp: , Rfl:    venlafaxine (EFFEXOR) 75 MG tablet, Take 150 mg by mouth at bedtime., Disp: , Rfl:   Allergies:  Allergies  Allergen Reactions   Paclitaxel Anaphylaxis   Penicillins Other (See  Comments)    Unknown Reaction when he was an infant.    Past Medical History, Surgical history, Social history, and Family History were reviewed and updated.  Review of Systems: Review of Systems  Constitutional: Negative.   HENT:  Negative.    Eyes: Negative.   Respiratory: Negative.    Cardiovascular: Negative.   Gastrointestinal: Negative.   Endocrine: Negative.   Genitourinary: Negative.    Musculoskeletal: Negative.   Skin: Negative.   Neurological:  Positive for headaches.  Hematological: Negative.   Psychiatric/Behavioral: Negative.     Physical Exam:  vitals were not taken for this visit.   Wt Readings from Last 3 Encounters:  07/14/21 279 lb (126.6 kg)  07/14/21 297 lb (134.7 kg)  07/07/21 291 lb (132 kg)    Physical Exam Vitals reviewed.  HENT:      Head: Normocephalic and atraumatic.  Eyes:     Pupils: Pupils are equal, round, and reactive to light.  Cardiovascular:     Rate and Rhythm: Normal rate and regular rhythm.     Heart sounds: Normal heart sounds.  Pulmonary:     Effort: Pulmonary effort is normal.     Breath sounds: Normal breath sounds.  Abdominal:     General: Bowel sounds are normal.     Palpations: Abdomen is soft.  Musculoskeletal:        General: No tenderness or deformity. Normal range of motion.     Cervical back: Normal range of motion.  Lymphadenopathy:     Cervical: No cervical adenopathy.  Skin:    General: Skin is warm and dry.     Findings: No erythema or rash.  Neurological:     Mental Status: He is alert and oriented to person, place, and time.  Psychiatric:        Behavior: Behavior normal.        Thought Content: Thought content normal.        Judgment: Judgment normal.     Lab Results  Component Value Date   WBC 6.6 08/12/2021   HGB 12.0 (L) 08/12/2021   HCT 35.5 (L) 08/12/2021   MCV 92.4 08/12/2021   PLT 175 08/12/2021     Chemistry      Component Value Date/Time   NA 137 07/22/2021 0757   K 3.8 07/22/2021 0757   CL 102 07/22/2021 0757   CO2 27 07/22/2021 0757   BUN 14 07/22/2021 0757   CREATININE 1.08 07/22/2021 0757      Component Value Date/Time   CALCIUM 9.2 07/22/2021 0757   ALKPHOS 59 07/22/2021 0757   AST 15 07/22/2021 0757   ALT 25 07/22/2021 0757   BILITOT 0.5 07/22/2021 0757     Impression and Plan: Kevin Hunter is a very nice 55 year old white male.  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  He has responded nicely as expected.  For right now, we will proceed with a total of 6 cycles of treatment.  Since he is responding and doing well treatment, I do not see a reason why we need to stop treatment at the present time and switch over to a "maintenance" type protocol.  I am just happy that his quality of life is doing so well right  now.  I know he will have a nice New Year's.  We will plan to get him back in 3 more weeks.   [name], MD 12/28/20228:31 AM

## 2021-08-14 ENCOUNTER — Encounter: Payer: Self-pay | Admitting: Hematology & Oncology

## 2021-08-14 ENCOUNTER — Inpatient Hospital Stay: Payer: Self-pay

## 2021-08-14 ENCOUNTER — Other Ambulatory Visit: Payer: Self-pay

## 2021-08-14 VITALS — BP 117/78 | HR 113 | Temp 98.7°F | Resp 20

## 2021-08-14 DIAGNOSIS — C3491 Malignant neoplasm of unspecified part of right bronchus or lung: Secondary | ICD-10-CM

## 2021-08-14 MED ORDER — PEGFILGRASTIM-CBQV 6 MG/0.6ML ~~LOC~~ SOSY
6.0000 mg | PREFILLED_SYRINGE | Freq: Once | SUBCUTANEOUS | Status: AC
  Administered 2021-08-14: 6 mg via SUBCUTANEOUS
  Filled 2021-08-14: qty 0.6

## 2021-08-14 NOTE — Patient Instructions (Signed)

## 2021-08-21 ENCOUNTER — Other Ambulatory Visit: Payer: Self-pay | Admitting: Radiation Therapy

## 2021-08-21 DIAGNOSIS — C7931 Secondary malignant neoplasm of brain: Secondary | ICD-10-CM

## 2021-08-28 ENCOUNTER — Encounter: Payer: Self-pay | Admitting: Hematology & Oncology

## 2021-09-01 NOTE — Progress Notes (Signed)
Patient is receiving Assistance Medication - Supplied Externally. Medication: Udenyca Manufacture: [employer]  Approval Dates: Approved from 08/18/2021 until 11/16/2021. Reason: Self  First DOS: 09/02/2021  Madalyn Rob, CPhT IV Drug Replacement Specialist  [place] Phone: [phone]

## 2021-09-02 ENCOUNTER — Encounter: Payer: Self-pay | Admitting: Family

## 2021-09-02 ENCOUNTER — Other Ambulatory Visit: Payer: Self-pay

## 2021-09-02 ENCOUNTER — Inpatient Hospital Stay (HOSPITAL_BASED_OUTPATIENT_CLINIC_OR_DEPARTMENT_OTHER): Payer: 59 | Admitting: Family

## 2021-09-02 ENCOUNTER — Inpatient Hospital Stay: Payer: 59

## 2021-09-02 ENCOUNTER — Encounter: Payer: Self-pay | Admitting: Hematology & Oncology

## 2021-09-02 ENCOUNTER — Inpatient Hospital Stay: Payer: 59 | Attending: Internal Medicine

## 2021-09-02 ENCOUNTER — Encounter: Payer: Self-pay | Admitting: *Deleted

## 2021-09-02 VITALS — BP 139/85 | HR 94 | Temp 97.9°F | Resp 18 | Wt 291.0 lb

## 2021-09-02 DIAGNOSIS — Z5189 Encounter for other specified aftercare: Secondary | ICD-10-CM | POA: Diagnosis not present

## 2021-09-02 DIAGNOSIS — Z5111 Encounter for antineoplastic chemotherapy: Secondary | ICD-10-CM | POA: Insufficient documentation

## 2021-09-02 DIAGNOSIS — Z5112 Encounter for antineoplastic immunotherapy: Secondary | ICD-10-CM | POA: Diagnosis present

## 2021-09-02 DIAGNOSIS — C3491 Malignant neoplasm of unspecified part of right bronchus or lung: Secondary | ICD-10-CM

## 2021-09-02 DIAGNOSIS — C7970 Secondary malignant neoplasm of unspecified adrenal gland: Secondary | ICD-10-CM | POA: Insufficient documentation

## 2021-09-02 DIAGNOSIS — Z79899 Other long term (current) drug therapy: Secondary | ICD-10-CM | POA: Insufficient documentation

## 2021-09-02 DIAGNOSIS — E032 Hypothyroidism due to medicaments and other exogenous substances: Secondary | ICD-10-CM | POA: Diagnosis not present

## 2021-09-02 DIAGNOSIS — C3411 Malignant neoplasm of upper lobe, right bronchus or lung: Secondary | ICD-10-CM | POA: Diagnosis present

## 2021-09-02 DIAGNOSIS — C7931 Secondary malignant neoplasm of brain: Secondary | ICD-10-CM | POA: Diagnosis not present

## 2021-09-02 LAB — CBC WITH DIFFERENTIAL (CANCER CENTER ONLY)
Abs Immature Granulocytes: 0.05 10*3/uL (ref 0.00–0.07)
Basophils Absolute: 0.1 10*3/uL (ref 0.0–0.1)
Basophils Relative: 1 %
Eosinophils Absolute: 0 10*3/uL (ref 0.0–0.5)
Eosinophils Relative: 0 %
HCT: 35.4 % — ABNORMAL LOW (ref 39.0–52.0)
Hemoglobin: 12 g/dL — ABNORMAL LOW (ref 13.0–17.0)
Immature Granulocytes: 1 %
Lymphocytes Relative: 20 %
Lymphs Abs: 1.5 10*3/uL (ref 0.7–4.0)
MCH: 32 pg (ref 26.0–34.0)
MCHC: 33.9 g/dL (ref 30.0–36.0)
MCV: 94.4 fL (ref 80.0–100.0)
Monocytes Absolute: 0.9 10*3/uL (ref 0.1–1.0)
Monocytes Relative: 11 %
Neutro Abs: 5.2 10*3/uL (ref 1.7–7.7)
Neutrophils Relative %: 67 %
Platelet Count: 151 10*3/uL (ref 150–400)
RBC: 3.75 MIL/uL — ABNORMAL LOW (ref 4.22–5.81)
RDW: 19.8 % — ABNORMAL HIGH (ref 11.5–15.5)
WBC Count: 7.7 10*3/uL (ref 4.0–10.5)
nRBC: 0 % (ref 0.0–0.2)

## 2021-09-02 LAB — CMP (CANCER CENTER ONLY)
ALT: 24 U/L (ref 0–44)
AST: 18 U/L (ref 15–41)
Albumin: 4.2 g/dL (ref 3.5–5.0)
Alkaline Phosphatase: 69 U/L (ref 38–126)
Anion gap: 9 (ref 5–15)
BUN: 14 mg/dL (ref 6–20)
CO2: 27 mmol/L (ref 22–32)
Calcium: 9.4 mg/dL (ref 8.9–10.3)
Chloride: 102 mmol/L (ref 98–111)
Creatinine: 1.11 mg/dL (ref 0.61–1.24)
GFR, Estimated: >60 mL/min (ref >60)
Glucose, Bld: 89 mg/dL (ref 70–99)
Potassium: 3.8 mmol/L (ref 3.5–5.1)
Sodium: 138 mmol/L (ref 135–145)
Total Bilirubin: 0.5 mg/dL (ref 0.3–1.2)
Total Protein: 7.2 g/dL (ref 6.5–8.1)

## 2021-09-02 LAB — TSH: TSH: 2.088 u[IU]/mL (ref 0.320–4.118)

## 2021-09-02 LAB — LACTATE DEHYDROGENASE: LDH: 235 U/L — ABNORMAL HIGH (ref 98–192)

## 2021-09-02 MED ORDER — PALONOSETRON HCL INJECTION 0.25 MG/5ML
0.2500 mg | Freq: Once | INTRAVENOUS | Status: AC
  Administered 2021-09-02: 0.25 mg via INTRAVENOUS
  Filled 2021-09-02: qty 5

## 2021-09-02 MED ORDER — SODIUM CHLORIDE 0.9 % IV SOLN: Freq: Once | INTRAVENOUS | Status: AC

## 2021-09-02 MED ORDER — SODIUM CHLORIDE 0.9 % IV SOLN
740.0000 mg | Freq: Once | INTRAVENOUS | Status: AC
  Administered 2021-09-02: 740 mg via INTRAVENOUS
  Filled 2021-09-02: qty 74

## 2021-09-02 MED ORDER — SODIUM CHLORIDE 0.9 % IV SOLN
67.5000 mg/m2 | Freq: Once | INTRAVENOUS | Status: AC
  Administered 2021-09-02: 170 mg via INTRAVENOUS
  Filled 2021-09-02: qty 17

## 2021-09-02 MED ORDER — SODIUM CHLORIDE 0.9 % IV SOLN
150.0000 mg | Freq: Once | INTRAVENOUS | Status: AC
  Administered 2021-09-02: 150 mg via INTRAVENOUS
  Filled 2021-09-02: qty 150

## 2021-09-02 MED ORDER — SODIUM CHLORIDE 0.9 % IV SOLN
240.0000 mg | Freq: Once | INTRAVENOUS | Status: AC
  Administered 2021-09-02: 240 mg via INTRAVENOUS
  Filled 2021-09-02: qty 24

## 2021-09-02 MED ORDER — SODIUM CHLORIDE 0.9% FLUSH
10.0000 mL | INTRAVENOUS | Status: DC | PRN
  Administered 2021-09-02: 10 mL

## 2021-09-02 MED ORDER — SODIUM CHLORIDE 0.9 % IV SOLN
10.0000 mg | Freq: Once | INTRAVENOUS | Status: AC
  Administered 2021-09-02: 10 mg via INTRAVENOUS
  Filled 2021-09-02: qty 10

## 2021-09-02 MED ORDER — HEPARIN SOD (PORK) LOCK FLUSH 100 UNIT/ML IV SOLN
500.0000 [IU] | Freq: Once | INTRAVENOUS | Status: AC | PRN
  Administered 2021-09-02: 500 [IU]

## 2021-09-02 MED ORDER — SODIUM CHLORIDE 0.9 % IV SOLN: Freq: Once | INTRAVENOUS | Status: DC

## 2021-09-02 NOTE — Progress Notes (Signed)
Hematology and Oncology Follow Up Visit  Kevin Hunter [id number] 21-Aug-1965 56 y.o. 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 He is doing quite well and has no complaints at this time.  He has mild SOB with over exertion and takes a break to rest when needed.  No adenopathy noted on exam.  No fever, chills, n/v, cough, rash, dizziness, chest pain, palpitations, abdominal pain or changes in bowel or bladder habits.  No blood loss noted. No bruising or petechiae.  No swelling or tenderness in his extremities.  He has intermittent numbness and tingling in his feet.  Pedal pulses are 2+.  No falls or syncope to report.  He has a good appetite and is staying well hydrated. His weight is stable at 291 lbs.   ECOG Performance Status: 1 - Symptomatic but completely ambulatory  Medications:  Allergies as of 09/02/2021       Reactions   Paclitaxel Anaphylaxis   Penicillins Other (See Comments)   Unknown Reaction when he was an infant.        Medication List        Accurate as of September 02, 2021  8:25 AM. If you have any questions, ask your nurse or doctor.          amLODipine 5 MG tablet Commonly known as: NORVASC Take 5 mg by mouth at bedtime.   clonazePAM 1 MG tablet Commonly known as: KLONOPIN Take 1 mg by mouth 2 (two) times daily as needed for anxiety (May take additional 0.5mg  as needed for panic attack).   dexamethasone 4 MG tablet Commonly known as: DECADRON Take 1 tablet (4 mg total) by mouth 2 (two) times daily.   EPINEPHrine 0.3 mg/0.3 mL Soaj injection Commonly known as: EPI-PEN Inject 0.3 mg into the muscle as needed  for anaphylaxis.   gabapentin 300 MG capsule Commonly known as: NEURONTIN Take 300 mg by mouth daily.   ibuprofen 200 MG tablet Commonly known as: ADVIL Take 800 mg by mouth every 6 (six) hours as needed for headache.   LamoTRIgine 300 MG Tb24 24 hour tablet Take 300 mg by mouth daily.   levETIRAcetam 500 MG tablet Commonly known as: KEPPRA Take 500 mg by mouth 2 (two) times daily.   predniSONE 20 MG tablet Commonly known as: DELTASONE Take 4 tablets (80 mg total) by mouth daily with breakfast.   pseudoephedrine 30 MG tablet Commonly known as: SUDAFED Take 60 mg by mouth every 4 (four) hours as needed (headache).   psyllium 58.6 % packet Commonly known as: METAMUCIL Take 1 packet by mouth daily.   QUEtiapine 400 MG 24 hr tablet Commonly known as: SEROQUEL XR Take 400 mg by mouth at bedtime.   tolterodine 4 MG 24 hr capsule Commonly known as: DETROL LA Take 4 mg by mouth at bedtime.   traZODone 100 MG tablet Commonly known as: DESYREL Take 200 mg by mouth at bedtime.   venlafaxine 75 MG tablet Commonly known as: EFFEXOR Take 150 mg by mouth at bedtime.        Allergies:  Allergies  Allergen Reactions   Paclitaxel Anaphylaxis   Penicillins Other (See Comments)  Unknown Reaction when he was an infant.    Past Medical History, Surgical history, Social history, and Family History were reviewed and updated.  Review of Systems: All other 10 point review of systems is negative.   Physical Exam:  vitals were not taken for this visit.   Wt Readings from Last 3 Encounters:  07/14/21 279 lb (126.6 kg)  07/14/21 297 lb (134.7 kg)  07/07/21 291 lb (132 kg)    Ocular: Sclerae unicteric, pupils equal, round and reactive to light Ear-nose-throat: Oropharynx clear, dentition fair Lymphatic: No cervical or supraclavicular adenopathy Lungs no rales or rhonchi, good excursion bilaterally Heart regular rate and rhythm, no murmur appreciated Abd soft, nontender,  positive bowel sounds MSK no focal spinal tenderness, no joint edema Neuro: non-focal, well-oriented, appropriate affect Breasts:   Lab Results  Component Value Date   WBC 7.7 09/02/2021   HGB 12.0 (L) 09/02/2021   HCT 35.4 (L) 09/02/2021   MCV 94.4 09/02/2021   PLT 151 09/02/2021   No results found for: FERRITIN, IRON, TIBC, UIBC, IRONPCTSAT Lab Results  Component Value Date   RBC 3.75 (L) 09/02/2021   No results found for: KPAFRELGTCHN, LAMBDASER, KAPLAMBRATIO No results found for: IGGSERUM, IGA, IGMSERUM No results found for: Odetta Pink, SPEI   Chemistry      Component Value Date/Time   NA 137 08/12/2021 0811   K 3.5 08/12/2021 0811   CL 102 08/12/2021 0811   CO2 28 08/12/2021 0811   BUN 12 08/12/2021 0811   CREATININE 1.06 08/12/2021 0811      Component Value Date/Time   CALCIUM 9.3 08/12/2021 0811   ALKPHOS 79 08/12/2021 0811   AST 16 08/12/2021 0811   ALT 22 08/12/2021 0811   BILITOT 0.5 08/12/2021 0811       Impression and Plan: Kevin Hunter is a very pleasant 56 yo caucasian gentleman with 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  He continues to tolerate treatment nicely.  We will proceed with cycle 3 today as planned.  Follow-up 3 weeks.   Lottie Dawson, NP 1/18/20238:25 AM

## 2021-09-02 NOTE — Patient Instructions (Signed)
[facility]  Discharge Instructions: Thank you for choosing [facility] to provide your oncology and hematology care.   If you have a lab appointment with the [facility], please go directly to the [facility] and check in at the registration area.  Wear comfortable clothing and clothing appropriate for easy access to any Portacath or PICC line.   We strive to give you quality time with your provider. You may need to reschedule your appointment if you arrive late (15 or more minutes).  Arriving late affects you and other patients whose appointments are after yours.  Also, if you miss three or more appointments without notifying the office, you may be dismissed from the clinic at the providers discretion.      For prescription refill requests, have your pharmacy contact our office and allow 72 hours for refills to be completed.    Today you received the following chemotherapy and/or immunotherapy agents Opdivo, Taxotere, Carboplatin.      To help prevent nausea and vomiting after your treatment, we encourage you to take your nausea medication as directed.  BELOW ARE SYMPTOMS THAT SHOULD BE REPORTED IMMEDIATELY: *FEVER GREATER THAN 100.4 F (38 C) OR HIGHER *CHILLS OR SWEATING *NAUSEA AND VOMITING THAT IS NOT CONTROLLED WITH YOUR NAUSEA MEDICATION *UNUSUAL SHORTNESS OF BREATH *UNUSUAL BRUISING OR BLEEDING *URINARY PROBLEMS (pain or burning when urinating, or frequent urination) *BOWEL PROBLEMS (unusual diarrhea, constipation, pain near the anus) TENDERNESS IN MOUTH AND THROAT WITH OR WITHOUT PRESENCE OF ULCERS (sore throat, sores in mouth, or a toothache) UNUSUAL RASH, SWELLING OR PAIN  UNUSUAL VAGINAL DISCHARGE OR ITCHING   Items with * indicate a potential emergency and should be followed up as soon as possible or go to the Emergency Department if any problems should occur.  Please show the CHEMOTHERAPY ALERT CARD or IMMUNOTHERAPY ALERT CARD  at check-in to the Emergency Department and triage nurse. Should you have questions after your visit or need to cancel or reschedule your appointment, please contact [facility]  [phone] and follow the prompts.  Office hours are 8:00 a.m. to 4:30 p.m. Monday - Friday. Please note that voicemails left after 4:00 p.m. may not be returned until the following business day.  We are closed weekends and major holidays. You have access to a nurse at all times for urgent questions. Please call the main number to the clinic [phone] and follow the prompts.  For any non-urgent questions, you may also contact your provider using MyChart. We now offer e-Visits for anyone 48 and older to request care online for non-urgent symptoms. For details visit [email or web address].   Also download the MyChart app! Go to the app store, search "MyChart", open the app, select [facility], and log in with your MyChart username and password.  Due to Covid, a mask is required upon entering the hospital/clinic. If you do not have a mask, one will be given to you upon arrival. For doctor visits, patients may have 1 support person aged 58 or older with them. For treatment visits, patients cannot have anyone with them due to current Covid guidelines and our immunocompromised population.

## 2021-09-02 NOTE — Progress Notes (Signed)
Oncology Nurse Navigator Documentation  Oncology Nurse Navigator Flowsheets 09/02/2021  Abnormal Finding Date -  Diagnosis Status -  Planned Course of Treatment -  Phase of Treatment -  Chemotherapy Pending- Reason: -  Chemotherapy Actual Start Date: -  Chemotherapy Expected End Date: -  Radiation Actual Start Date: -  Radiation Actual End Date: -  Navigator Follow Up Date: 09/23/2021  Navigator Follow Up Reason: Follow-up Appointment;Chemotherapy  Navigator Location [facility]  Referral Date to RadOnc/MedOnc -  Navigator Encounter Type Appt/Treatment Plan Review  Telephone -  Treatment Initiated Date -  Patient Visit Type MedOnc  Treatment Phase Active Tx  Barriers/Navigation Needs Coordination of Care;Education  Education -  Interventions None Required  Acuity Level 2-Minimal Needs (1-2 Barriers Identified)  Coordination of Care -  Education Method -  Support Groups/Services Friends and Family  Time Spent with Patient 15

## 2021-09-02 NOTE — Progress Notes (Signed)
..  Patient Assist/Replace for the following has been terminated. Medication: Udenyca Reason for Termination: Patient has [employer] as of 08/16/2021. Last DOS: 08/14/2021.  Marland KitchenJuan Quam, CPhT IV Drug Replacement Specialist [facility] Phone: [phone]

## 2021-09-02 NOTE — Patient Instructions (Signed)

## 2021-09-03 ENCOUNTER — Encounter: Payer: Self-pay | Admitting: Hematology & Oncology

## 2021-09-04 ENCOUNTER — Inpatient Hospital Stay: Payer: 59

## 2021-09-04 ENCOUNTER — Other Ambulatory Visit: Payer: Self-pay

## 2021-09-04 VITALS — BP 108/69 | HR 85 | Temp 97.9°F | Resp 20

## 2021-09-04 DIAGNOSIS — C3491 Malignant neoplasm of unspecified part of right bronchus or lung: Secondary | ICD-10-CM

## 2021-09-04 DIAGNOSIS — Z5112 Encounter for antineoplastic immunotherapy: Secondary | ICD-10-CM | POA: Diagnosis not present

## 2021-09-04 MED ORDER — PEGFILGRASTIM-BMEZ 6 MG/0.6ML ~~LOC~~ SOSY
6.0000 mg | PREFILLED_SYRINGE | Freq: Once | SUBCUTANEOUS | Status: AC
  Administered 2021-09-04: 6 mg via SUBCUTANEOUS
  Filled 2021-09-04: qty 0.6

## 2021-09-04 NOTE — Patient Instructions (Signed)

## 2021-09-08 ENCOUNTER — Encounter: Payer: Self-pay | Admitting: Radiation Oncology

## 2021-09-08 NOTE — Progress Notes (Addendum)
Called patient x2 w/ no answer. Called patient's daughter 'Kevin Hunter', verified her identity, and begin nursing interview in reference to her dad/patient 'Kevin Hunter'. She answered my questions accordingly but states "She does not know the accuracy of the information given." Patient doing well. No symptoms reported at this time.  Meaningful use complete.  Daughter 'Kevin Hunter' notified of patient's 3:30pm-09/14/21 telephone appointment w/ Shona Simpson PA-C. I left my extension [phone] in case patient needs to call. Daughter verbalized understanding of information given.  Patient contact [phone]

## 2021-09-10 ENCOUNTER — Inpatient Hospital Stay: Admission: RE | Admit: 2021-09-10 | Payer: 59 | Source: Ambulatory Visit

## 2021-09-14 ENCOUNTER — Inpatient Hospital Stay: Payer: 59

## 2021-09-14 ENCOUNTER — Ambulatory Visit
Admission: RE | Admit: 2021-09-14 | Discharge: 2021-09-14 | Disposition: A | Discharge status: Home / Self Care | Payer: 59 | Source: Ambulatory Visit | Attending: Radiation Oncology | Admitting: Radiation Oncology

## 2021-09-14 ENCOUNTER — Telehealth: Payer: Self-pay

## 2021-09-14 DIAGNOSIS — C3491 Malignant neoplasm of unspecified part of right bronchus or lung: Secondary | ICD-10-CM | POA: Insufficient documentation

## 2021-09-14 NOTE — Telephone Encounter (Signed)
Patient notified via voicemail of today's appointment changed to 09/21/21 at 3:30pm w/ Shona Simpson PA-C . I left my extension [phone] for pt. to return my call to confirm receipt of information.

## 2021-09-15 ENCOUNTER — Ambulatory Visit
Admission: RE | Admit: 2021-09-15 | Discharge: 2021-09-15 | Disposition: A | Discharge status: Home / Self Care | Payer: 59 | Source: Ambulatory Visit | Attending: Radiation Oncology | Admitting: Radiation Oncology

## 2021-09-15 ENCOUNTER — Other Ambulatory Visit: Payer: Self-pay

## 2021-09-15 DIAGNOSIS — C7931 Secondary malignant neoplasm of brain: Secondary | ICD-10-CM

## 2021-09-15 MED ORDER — GADOBENATE DIMEGLUMINE 529 MG/ML IV SOLN
20.0000 mL | Freq: Once | INTRAVENOUS | Status: AC | PRN
  Administered 2021-09-15: 20 mL via INTRAVENOUS

## 2021-09-16 ENCOUNTER — Telehealth: Payer: Self-pay | Admitting: Radiation Oncology

## 2021-09-16 NOTE — Telephone Encounter (Signed)
LM for pt that I would call him back Monday after discussion in conference, but that MRI report appears reassuring

## 2021-09-18 ENCOUNTER — Telehealth: Payer: Self-pay

## 2021-09-18 NOTE — Telephone Encounter (Signed)
Called patient x2 in reference to his 3:30pm-09/21/21 telephone appointment w/ Shona Simpson PA-C. No answer, left voicemail w/ my extension [phone]. Asked that patient return my call before appointment time on 09/21/21 in hopes of completing the nursing portion of the appointment. I will attempt one more call on 09/21/21 prior to 3:30pm.

## 2021-09-21 ENCOUNTER — Encounter: Payer: Self-pay | Admitting: Radiation Oncology

## 2021-09-21 ENCOUNTER — Ambulatory Visit
Admission: RE | Admit: 2021-09-21 | Discharge: 2021-09-21 | Disposition: A | Discharge status: Home / Self Care | Payer: 59 | Source: Ambulatory Visit | Attending: Radiation Oncology | Admitting: Radiation Oncology

## 2021-09-21 ENCOUNTER — Inpatient Hospital Stay: Payer: 59 | Attending: Internal Medicine

## 2021-09-21 DIAGNOSIS — C3411 Malignant neoplasm of upper lobe, right bronchus or lung: Secondary | ICD-10-CM | POA: Insufficient documentation

## 2021-09-21 DIAGNOSIS — C3491 Malignant neoplasm of unspecified part of right bronchus or lung: Secondary | ICD-10-CM

## 2021-09-21 DIAGNOSIS — Z79899 Other long term (current) drug therapy: Secondary | ICD-10-CM | POA: Insufficient documentation

## 2021-09-21 DIAGNOSIS — Z5111 Encounter for antineoplastic chemotherapy: Secondary | ICD-10-CM | POA: Insufficient documentation

## 2021-09-21 DIAGNOSIS — Z5189 Encounter for other specified aftercare: Secondary | ICD-10-CM | POA: Insufficient documentation

## 2021-09-21 DIAGNOSIS — C7970 Secondary malignant neoplasm of unspecified adrenal gland: Secondary | ICD-10-CM | POA: Insufficient documentation

## 2021-09-21 DIAGNOSIS — C7931 Secondary malignant neoplasm of brain: Secondary | ICD-10-CM | POA: Insufficient documentation

## 2021-09-21 DIAGNOSIS — Z5112 Encounter for antineoplastic immunotherapy: Secondary | ICD-10-CM | POA: Insufficient documentation

## 2021-09-21 NOTE — Progress Notes (Signed)
Radiation Oncology         [phone] ________________________________  Outpatient Follow Up - Conducted via telephone due to current COVID-19 concerns for limiting patient exposure  I spoke with the patient to conduct this consult visit via telephone to spare the patient unnecessary potential exposure in the healthcare setting during the current COVID-19 pandemic. The patient was notified in advance and was offered a [facility] meeting to allow for face to face communication but unfortunately reported that they did not have the appropriate resources/technology to support such a visit and instead preferred to proceed with a telephone visit.    Name: Kevin Hunter        MRN: [id number]  Date of Service: 09/21/2021 DOB: 11/04/1965  GY:IRSWNI, Annie Main, MD     REFERRING PHYSICIAN: Dr. Marin Olp   DIAGNOSIS: The encounter diagnosis was Lung cancer, primary, with metastasis from lung to other site, right [facility]).   HISTORY OF PRESENT ILLNESS: Kevin Hunter is a 56 y.o. 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  He presented with neurologic symptoms and his lung disease was noted as well during an ED visit. He underwent bronchoscopy on 05/22/2021 and final pathology from that procedure revealed malignant cells consistent with non-small cell carcinoma in the right upper lobe brushings and fine-needle aspirate, both consistent with IHC for squamous cell carcinoma.  Insufficient cellularity in the cellblock was noted for any additional testing however.  He proceeded with stereotactic radiosurgery in one fraction on 06/12/21. Since this treatment he started systemic chemo/immunotherapy on on 06/25/21, and has completed 3 cycles. He underwent surveillance MRI on 09/15/21 that showed improvement in his previously treated disease, the left parietal now 18 mm (previously 20 mm), the left frontal now 6 mm (previously 8 mm), in the right frontal now  16 mm (previously 21 mm).  A possible lesion was seen in the right frontal convexity but in retrospect had been stable and unchanged.  In discussion this morning and brain and spine oncology conference it was felt that this should be followed expectantly. Of note the patient has also done well systemically with interval response to therapy and no evidence of progressive disease.  He is contacted by phone to review his MRI results.  PREVIOUS RADIATION THERAPY:   06/12/2021 through 06/12/2021 SRS Treatment Site Technique Total Dose (Gy) Dose per Fx (Gy) Completed Fx Beam Energies  Brain: Each site was treated in 1 fraction to 20 Gy   PTV_1_RtFront1mm PTV_2_LtPariet8mm  PTV_3_LtFront51mm IMRT 20/20 20 1/1 [id number]     PAST MEDICAL HISTORY:  Past Medical History:  Diagnosis Date   Anxiety    Bipolar 1 disorder ([place])    Goals of care, counseling/discussion 05/27/2021   Hypertension    Lung cancer, primary, with metastasis from lung to other site, right ([place]) 05/27/2021   Mood disorder ([place])    Sleep apnea        PAST SURGICAL HISTORY: Past Surgical History:  Procedure Laterality Date   BRONCHIAL BIOPSY  05/22/2021   Procedure: BRONCHIAL BIOPSIES;  Surgeon: Garner Nash, DO;  Location: [facility];  Service: Pulmonary;;   BRONCHIAL BRUSHINGS  05/22/2021   Procedure: BRONCHIAL BRUSHINGS;  Surgeon: Garner Nash, DO;  Location: [facility];  Service: Pulmonary;;   BRONCHIAL NEEDLE ASPIRATION BIOPSY  05/22/2021   Procedure: BRONCHIAL NEEDLE ASPIRATION BIOPSIES;  Surgeon: Garner Nash, DO;  Location: [facility];  Service: Pulmonary;;   IR IMAGING GUIDED  PORT INSERTION  06/24/2021   VIDEO BRONCHOSCOPY WITH ENDOBRONCHIAL NAVIGATION N/A 05/22/2021   Procedure: VIDEO BRONCHOSCOPY WITH ENDOBRONCHIAL NAVIGATION;  Surgeon: Garner Nash, DO;  Location: [facility];  Service: Pulmonary;  Laterality: N/A;   VIDEO BRONCHOSCOPY WITH RADIAL ENDOBRONCHIAL ULTRASOUND  05/22/2021    Procedure: RADIAL ENDOBRONCHIAL ULTRASOUND;  Surgeon: Garner Nash, DO;  Location: [facility];  Service: Pulmonary;;     FAMILY HISTORY:  Family History  Problem Relation Age of Onset   Breast cancer Mother    Lung cancer Father      SOCIAL HISTORY:  reports that he quit smoking about 3 years ago. His smoking use included cigarettes and e-cigarettes. He started smoking about 3 years ago. He has never used smokeless tobacco. He reports that he does not drink alcohol and does not use drugs.  The patient is single and lives in [place].  He works for a company that [employer]. 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    ALLERGIES: Paclitaxel and Penicillins   MEDICATIONS:  Current Outpatient Medications  Medication Sig Dispense Refill   amLODipine (NORVASC) 5 MG tablet Take 5 mg by mouth at bedtime.     clonazePAM (KLONOPIN) 1 MG tablet Take 1 mg by mouth 2 (two) times daily as needed for anxiety (May take additional 0.5mg  as needed for panic attack).     dexamethasone (DECADRON) 4 MG tablet Take 1 tablet (4 mg total) by mouth 2 (two) times daily. 60 tablet 0   EPINEPHrine 0.3 mg/0.3 mL IJ SOAJ injection Inject 0.3 mg into the muscle as needed for anaphylaxis. 1 each 0   gabapentin (NEURONTIN) 300 MG capsule Take 300 mg by mouth daily.     ibuprofen (ADVIL) 200 MG tablet Take 800 mg by mouth every 6 (six) hours as needed for headache.     LamoTRIgine 300 MG TB24 24 hour tablet Take 300 mg by mouth daily.     levETIRAcetam (KEPPRA) 500 MG tablet Take 500 mg by mouth 2 (two) times daily.     predniSONE (DELTASONE) 20 MG tablet Take 4 tablets (80 mg total) by mouth daily with breakfast. 120 tablet 0   pseudoephedrine (SUDAFED) 30 MG tablet Take 60 mg by mouth every 4 (four) hours as needed (headache).     psyllium (METAMUCIL) 58.6 % packet Take 1 packet by mouth daily.     QUEtiapine (SEROQUEL XR) 400 MG 24 hr tablet  Take 400 mg by mouth at bedtime.     tolterodine (DETROL LA) 4 MG 24 hr capsule Take 4 mg by mouth at bedtime.     traZODone (DESYREL) 100 MG tablet Take 200 mg by mouth at bedtime.     venlafaxine (EFFEXOR) 75 MG tablet Take 150 mg by mouth at bedtime.     No current facility-administered medications for this encounter.     REVIEW OF SYSTEMS: On review of systems, the patient states that he is feeling almost back to normal with the exception of some of the chemotherapy side effects.  He states that he has most recently noted an episode of feeling dizzy after from seated to standing position.  He denies any significant weight loss in the last several weeks to months.  He denies any new  headaches, changes in speech, movement, hearing, or vision.  No other complaints are verbalized.     PHYSICAL EXAM:  Unable to assess given encounter type.   ECOG =  1  0 - Asymptomatic (Fully active, able to carry on all predisease activities without restriction)  1 - Symptomatic but completely ambulatory (Restricted in physically strenuous activity but ambulatory and able to carry out work of a light or sedentary nature. For example, light housework, office work)  2 - Symptomatic, <50% in bed during the day (Ambulatory and capable of all self care but unable to carry out any work activities. Up and about more than 50% of waking hours)  3 - Symptomatic, >50% in bed, but not bedbound (Capable of only limited self-care, confined to bed or chair 50% or more of waking hours)  4 - Bedbound (Completely disabled. Cannot carry on any self-care. Totally confined to bed or chair)  5 - Death   Eustace Pen MM, Creech RH, Tormey DC, et al. [phone]). "Toxicity and response criteria of the [employer]". [place] Oncol. 5 (6): 649-55    LABORATORY DATA:  Lab Results  Component Value Date   WBC 7.7 09/02/2021   HGB 12.0 (L) 09/02/2021   HCT 35.4 (L) 09/02/2021   MCV 94.4 09/02/2021   PLT 151  09/02/2021   Lab Results  Component Value Date   NA 138 09/02/2021   K 3.8 09/02/2021   CL 102 09/02/2021   CO2 27 09/02/2021   Lab Results  Component Value Date   ALT 24 09/02/2021   AST 18 09/02/2021   ALKPHOS 69 09/02/2021   BILITOT 0.5 09/02/2021      RADIOGRAPHY: MR Brain W Wo Contrast  Result Date: 09/16/2021 CLINICAL DATA:  Non-small-cell metastatic lung cancer. Follow-up response to treatment. Chemo and radiation. EXAM: MRI HEAD WITHOUT AND WITH CONTRAST TECHNIQUE: Multiplanar, multiecho pulse sequences of the brain and surrounding structures were obtained without and with intravenous contrast. CONTRAST:  54mL MULTIHANCE GADOBENATE DIMEGLUMINE 529 MG/ML IV SOLN COMPARISON:  MRI head 06/04/2021 FINDINGS: Brain: 3 enhancing metastatic deposits in the brain show interval improvement compared to the prior study. Left parietal rim enhancing and necrotic lesion is smaller. Small amount of associated hemorrhage. Lesion now measures 18 x 13 mm. Decreased edema. Left frontal lobe lesion improved in size now measuring 6 x 5 mm. Right parietal convexity lesion is smaller now measuring 16 x 15 mm. This lesion also shows central necrosis. Surrounding vasogenic edema has improved. Ventricle size is normal.  No midline shift.  No acute infarct. Vascular: Normal arterial flow voids at the skull base Skull and upper cervical spine: Possible bone marrow lesion in the right frontal convexity unchanged. Reference image 14 series number 2 Sinuses/Orbits: Paranasal sinuses clear.  Negative orbit Other: None IMPRESSION: Interval improvement in 3 metastatic deposits in the brain. Decreased surrounding edema. No new lesions Possible lesion in the right frontal bone over the convexity. Electronically Signed   By: Franchot Gallo M.D.   On: 09/16/2021 10:51       IMPRESSION/PLAN: 1. 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  The patient continues to do well  radiographically as well as clinically.  He was counseled on the rationale for continued surveillance with MRIs at 55-month intervals for the rest of the year and slowly transitioning to less frequent thereafter.  He will continue systemic therapy with Dr. Marin Olp.  I encouraged him to let us know if he has any questions or concerns prior to his next visit. 2. Possible orthostatic hypotension.  I encouraged the patient to try and take his blood pressure at home several times  a day for a few days in a row and to report any low blood pressure readings or abnormally high readings to his PCP.  He is in agreement with this plan  Given current concerns for patient exposure during the COVID-19 pandemic, this encounter was conducted via telephone.  The patient has provided two factor identification and has given verbal consent for this type of encounter and has been advised to only accept a meeting of this type in a secure network environment. The time spent during this encounter was 35 minutes including preparation, discussion, and coordination of the patient's care. The attendants for this meeting include   Hayden Pedro  and [name].  During the encounter,   Hayden Pedro was located at [facility] Radiation Oncology Department.  [name] was located at home.      Carola Rhine, [facility]   **Disclaimer: This note was dictated with voice recognition software. Similar sounding words can inadvertently be transcribed and this note may contain transcription errors which may not have been corrected upon publication of note.**

## 2021-09-21 NOTE — Progress Notes (Signed)
Spoke w/ patient, verified identity, and begin nursing interview. Patient reports moderate fatigue. No other symptoms reported at this time.  Meaningful use complete.  Patient notified of his 3:30pm-09/21/21 telephone appointment w/ Shona Simpson PA-C. I left my extension [phone] in case patient needs to call. Patient verbalized understanding of information.  Patient contact [phone]

## 2021-09-23 ENCOUNTER — Encounter: Payer: Self-pay | Admitting: *Deleted

## 2021-09-23 ENCOUNTER — Inpatient Hospital Stay: Payer: 59

## 2021-09-23 ENCOUNTER — Inpatient Hospital Stay (HOSPITAL_BASED_OUTPATIENT_CLINIC_OR_DEPARTMENT_OTHER): Payer: 59 | Admitting: Family

## 2021-09-23 ENCOUNTER — Encounter: Payer: Self-pay | Admitting: Family

## 2021-09-23 ENCOUNTER — Other Ambulatory Visit: Payer: Self-pay

## 2021-09-23 VITALS — BP 122/79 | HR 84 | Temp 98.7°F | Resp 20 | Wt 294.1 lb

## 2021-09-23 VITALS — BP 120/74 | HR 82 | Resp 19

## 2021-09-23 DIAGNOSIS — E032 Hypothyroidism due to medicaments and other exogenous substances: Secondary | ICD-10-CM

## 2021-09-23 DIAGNOSIS — C3491 Malignant neoplasm of unspecified part of right bronchus or lung: Secondary | ICD-10-CM

## 2021-09-23 DIAGNOSIS — Z5112 Encounter for antineoplastic immunotherapy: Secondary | ICD-10-CM | POA: Diagnosis not present

## 2021-09-23 DIAGNOSIS — C7970 Secondary malignant neoplasm of unspecified adrenal gland: Secondary | ICD-10-CM | POA: Diagnosis not present

## 2021-09-23 DIAGNOSIS — Z79899 Other long term (current) drug therapy: Secondary | ICD-10-CM | POA: Diagnosis not present

## 2021-09-23 DIAGNOSIS — Z5111 Encounter for antineoplastic chemotherapy: Secondary | ICD-10-CM | POA: Diagnosis present

## 2021-09-23 DIAGNOSIS — C3411 Malignant neoplasm of upper lobe, right bronchus or lung: Secondary | ICD-10-CM | POA: Diagnosis present

## 2021-09-23 DIAGNOSIS — C7931 Secondary malignant neoplasm of brain: Secondary | ICD-10-CM | POA: Diagnosis not present

## 2021-09-23 DIAGNOSIS — Z5189 Encounter for other specified aftercare: Secondary | ICD-10-CM | POA: Diagnosis not present

## 2021-09-23 LAB — CBC WITH DIFFERENTIAL (CANCER CENTER ONLY)
Abs Immature Granulocytes: 0.02 10*3/uL (ref 0.00–0.07)
Basophils Absolute: 0.1 10*3/uL (ref 0.0–0.1)
Basophils Relative: 1 %
Eosinophils Absolute: 0 10*3/uL (ref 0.0–0.5)
Eosinophils Relative: 0 %
HCT: 32.9 % — ABNORMAL LOW (ref 39.0–52.0)
Hemoglobin: 11.1 g/dL — ABNORMAL LOW (ref 13.0–17.0)
Immature Granulocytes: 0 %
Lymphocytes Relative: 19 %
Lymphs Abs: 1.1 10*3/uL (ref 0.7–4.0)
MCH: 33.4 pg (ref 26.0–34.0)
MCHC: 33.7 g/dL (ref 30.0–36.0)
MCV: 99.1 fL (ref 80.0–100.0)
Monocytes Absolute: 0.7 10*3/uL (ref 0.1–1.0)
Monocytes Relative: 13 %
Neutro Abs: 3.9 10*3/uL (ref 1.7–7.7)
Neutrophils Relative %: 67 %
Platelet Count: 116 10*3/uL — ABNORMAL LOW (ref 150–400)
RBC: 3.32 MIL/uL — ABNORMAL LOW (ref 4.22–5.81)
RDW: 18.8 % — ABNORMAL HIGH (ref 11.5–15.5)
WBC Count: 5.7 10*3/uL (ref 4.0–10.5)
nRBC: 0 % (ref 0.0–0.2)

## 2021-09-23 LAB — CMP (CANCER CENTER ONLY)
ALT: 19 U/L (ref 0–44)
AST: 16 U/L (ref 15–41)
Albumin: 3.9 g/dL (ref 3.5–5.0)
Alkaline Phosphatase: 69 U/L (ref 38–126)
Anion gap: 6 (ref 5–15)
BUN: 13 mg/dL (ref 6–20)
CO2: 28 mmol/L (ref 22–32)
Calcium: 9.1 mg/dL (ref 8.9–10.3)
Chloride: 103 mmol/L (ref 98–111)
Creatinine: 1.05 mg/dL (ref 0.61–1.24)
GFR, Estimated: >60 mL/min (ref >60)
Glucose, Bld: 84 mg/dL (ref 70–99)
Potassium: 3.7 mmol/L (ref 3.5–5.1)
Sodium: 137 mmol/L (ref 135–145)
Total Bilirubin: 0.4 mg/dL (ref 0.3–1.2)
Total Protein: 6.5 g/dL (ref 6.5–8.1)

## 2021-09-23 LAB — LACTATE DEHYDROGENASE: LDH: 225 U/L — ABNORMAL HIGH (ref 98–192)

## 2021-09-23 LAB — TSH: TSH: 1.743 u[IU]/mL (ref 0.320–4.118)

## 2021-09-23 MED ORDER — SODIUM CHLORIDE 0.9 % IV SOLN
10.0000 mg | Freq: Once | INTRAVENOUS | Status: AC
  Administered 2021-09-23: 10 mg via INTRAVENOUS
  Filled 2021-09-23: qty 10

## 2021-09-23 MED ORDER — HEPARIN SOD (PORK) LOCK FLUSH 100 UNIT/ML IV SOLN
500.0000 [IU] | Freq: Once | INTRAVENOUS | Status: AC | PRN
  Administered 2021-09-23: 500 [IU]

## 2021-09-23 MED ORDER — SODIUM CHLORIDE 0.9 % IV SOLN: Freq: Once | INTRAVENOUS | Status: DC

## 2021-09-23 MED ORDER — SODIUM CHLORIDE 0.9 % IV SOLN: Freq: Once | INTRAVENOUS | Status: AC

## 2021-09-23 MED ORDER — SODIUM CHLORIDE 0.9 % IV SOLN
67.5000 mg/m2 | Freq: Once | INTRAVENOUS | Status: AC
  Administered 2021-09-23: 170 mg via INTRAVENOUS
  Filled 2021-09-23: qty 17

## 2021-09-23 MED ORDER — SODIUM CHLORIDE 0.9 % IV SOLN
740.0000 mg | Freq: Once | INTRAVENOUS | Status: AC
  Administered 2021-09-23: 740 mg via INTRAVENOUS
  Filled 2021-09-23: qty 74

## 2021-09-23 MED ORDER — SODIUM CHLORIDE 0.9 % IV SOLN
150.0000 mg | Freq: Once | INTRAVENOUS | Status: AC
  Administered 2021-09-23: 150 mg via INTRAVENOUS
  Filled 2021-09-23: qty 150

## 2021-09-23 MED ORDER — PALONOSETRON HCL INJECTION 0.25 MG/5ML
0.2500 mg | Freq: Once | INTRAVENOUS | Status: AC
  Administered 2021-09-23: 0.25 mg via INTRAVENOUS
  Filled 2021-09-23: qty 5

## 2021-09-23 MED ORDER — SODIUM CHLORIDE 0.9% FLUSH
10.0000 mL | INTRAVENOUS | Status: DC | PRN
  Administered 2021-09-23: 10 mL

## 2021-09-23 MED ORDER — SODIUM CHLORIDE 0.9 % IV SOLN
240.0000 mg | Freq: Once | INTRAVENOUS | Status: AC
  Administered 2021-09-23: 240 mg via INTRAVENOUS
  Filled 2021-09-23: qty 24

## 2021-09-23 NOTE — Progress Notes (Signed)
Hematology and Oncology Follow Up Visit  Kevin Hunter [id number] 12-18-1965 56 y.o. 09/23/2021   Principle Diagnosis:  Metastatic squamous cell carcinoma of the lung-brain, nodal, adrenal metastasis --no biopsy material for molecular analysis   Current Therapy:        Status post radiosurgery for CNS metastasis -- 06/12/2021 Carbo/Taxotere/Opdivo -- s/p cycle 2 -- start on 06/23/2021   Interim History:  Kevin Hunter is here today for follow-up and treatment. He is doing quite well and has no complaints at this time.  His MRI of the brain showed a nice response to treatment.  He had some dizziness last week that has since resolved.  No fever, chills, n/v, cough, rash, SOB, chest pain, palpitations, abdominal pain or changes in bowel or bladder habits.  No swelling, tenderness, numbness or tingling in his extremities.  No falls or syncope to report.  He has maintained a good appetite and is staying well hydrated. His weight is stable at 294 lbs.   ECOG Performance Status: 1 - Symptomatic but completely ambulatory  Medications:  Allergies as of 09/23/2021       Reactions   Paclitaxel Anaphylaxis   Penicillins Other (See Comments)   Unknown Reaction when he was an infant.        Medication List        Accurate as of September 23, 2021  8:38 AM. If you have any questions, ask your nurse or doctor.          amLODipine 5 MG tablet Commonly known as: NORVASC Take 5 mg by mouth at bedtime.   clonazePAM 1 MG tablet Commonly known as: KLONOPIN Take 1 mg by mouth 2 (two) times daily as needed for anxiety (May take additional 0.5mg  as needed for panic attack).   dexamethasone 4 MG tablet Commonly known as: DECADRON Take 1 tablet (4 mg total) by mouth 2 (two) times daily.   EPINEPHrine 0.3 mg/0.3 mL Soaj injection Commonly known as: EPI-PEN Inject 0.3 mg into the muscle as needed for anaphylaxis.   gabapentin 300 MG capsule Commonly known as: NEURONTIN Take 300 mg by  mouth daily.   ibuprofen 200 MG tablet Commonly known as: ADVIL Take 800 mg by mouth every 6 (six) hours as needed for headache.   LamoTRIgine 300 MG Tb24 24 hour tablet Take 300 mg by mouth daily.   levETIRAcetam 500 MG tablet Commonly known as: KEPPRA Take 500 mg by mouth 2 (two) times daily.   predniSONE 20 MG tablet Commonly known as: DELTASONE Take 4 tablets (80 mg total) by mouth daily with breakfast.   pseudoephedrine 30 MG tablet Commonly known as: SUDAFED Take 60 mg by mouth every 4 (four) hours as needed (headache).   psyllium 58.6 % packet Commonly known as: METAMUCIL Take 1 packet by mouth daily.   QUEtiapine 400 MG 24 hr tablet Commonly known as: SEROQUEL XR Take 400 mg by mouth at bedtime.   tolterodine 4 MG 24 hr capsule Commonly known as: DETROL LA Take 4 mg by mouth at bedtime.   traZODone 100 MG tablet Commonly known as: DESYREL Take 200 mg by mouth at bedtime.   venlafaxine 75 MG tablet Commonly known as: EFFEXOR Take 150 mg by mouth at bedtime.        Allergies:  Allergies  Allergen Reactions   Paclitaxel Anaphylaxis   Penicillins Other (See Comments)    Unknown Reaction when he was an infant.    Past Medical History, Surgical history, Social history, and Family  History were reviewed and updated.  Review of Systems: All other 10 point review of systems is negative.   Physical Exam:  weight is 294 lb 1 oz (133.4 kg). His oral temperature is 98.7 F (37.1 C). His blood pressure is 122/79 and his pulse is 84. His respiration is 20 and oxygen saturation is 94%.   Wt Readings from Last 3 Encounters:  09/23/21 294 lb 1 oz (133.4 kg)  09/02/21 291 lb (132 kg)  07/14/21 279 lb (126.6 kg)    Ocular: Sclerae unicteric, pupils equal, round and reactive to light Ear-nose-throat: Oropharynx clear, dentition fair Lymphatic: No cervical or supraclavicular adenopathy Lungs no rales or rhonchi, good excursion bilaterally Heart regular rate  and rhythm, no murmur appreciated Abd soft, nontender, positive bowel sounds MSK no focal spinal tenderness, no joint edema Neuro: non-focal, well-oriented, appropriate affect Breasts: Deferred   Lab Results  Component Value Date   WBC 5.7 09/23/2021   HGB 11.1 (L) 09/23/2021   HCT 32.9 (L) 09/23/2021   MCV 99.1 09/23/2021   PLT 116 (L) 09/23/2021   No results found for: FERRITIN, IRON, TIBC, UIBC, IRONPCTSAT Lab Results  Component Value Date   RBC 3.32 (L) 09/23/2021   No results found for: KPAFRELGTCHN, LAMBDASER, KAPLAMBRATIO No results found for: IGGSERUM, IGA, IGMSERUM No results found for: Ronnald Ramp, A1GS, A2GS, Tillman Sers, SPEI   Chemistry      Component Value Date/Time   NA 138 09/02/2021 0815   K 3.8 09/02/2021 0815   CL 102 09/02/2021 0815   CO2 27 09/02/2021 0815   BUN 14 09/02/2021 0815   CREATININE 1.11 09/02/2021 0815      Component Value Date/Time   CALCIUM 9.4 09/02/2021 0815   ALKPHOS 69 09/02/2021 0815   AST 18 09/02/2021 0815   ALT 24 09/02/2021 0815   BILITOT 0.5 09/02/2021 0815       Impression and Plan: Kevin Hunter is a very pleasant 56 yo caucasian gentleman with 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  We will proceed with treatment today as planned per MD.  Follow-up in 3 weeks.   Lottie Dawson, NP 2/8/20238:38 AM

## 2021-09-25 ENCOUNTER — Other Ambulatory Visit: Payer: Self-pay

## 2021-09-25 ENCOUNTER — Inpatient Hospital Stay: Payer: 59

## 2021-09-25 VITALS — BP 116/71 | HR 94 | Temp 98.9°F | Resp 19

## 2021-09-25 DIAGNOSIS — C3491 Malignant neoplasm of unspecified part of right bronchus or lung: Secondary | ICD-10-CM

## 2021-09-25 DIAGNOSIS — Z5112 Encounter for antineoplastic immunotherapy: Secondary | ICD-10-CM | POA: Diagnosis not present

## 2021-09-25 MED ORDER — PEGFILGRASTIM-BMEZ 6 MG/0.6ML ~~LOC~~ SOSY
6.0000 mg | PREFILLED_SYRINGE | Freq: Once | SUBCUTANEOUS | Status: AC
  Administered 2021-09-25: 6 mg via SUBCUTANEOUS
  Filled 2021-09-25: qty 0.6

## 2021-09-25 NOTE — Patient Instructions (Signed)
[facility]  Discharge Instructions: Thank you for choosing [facility] to provide your oncology and hematology care.   If you have a lab appointment with the [facility], please go directly to the [facility] and check in at the registration area.  Wear comfortable clothing and clothing appropriate for easy access to any Portacath or PICC line.   We strive to give you quality time with your provider. You may need to reschedule your appointment if you arrive late (15 or more minutes).  Arriving late affects you and other patients whose appointments are after yours.  Also, if you miss three or more appointments without notifying the office, you may be dismissed from the clinic at the providers discretion.      For prescription refill requests, have your pharmacy contact our office and allow 72 hours for refills to be completed.    Today you received the following chemotherapy and/or immunotherapy agents [id number]   To help prevent nausea and vomiting after your treatment, we encourage you to take your nausea medication as directed.  BELOW ARE SYMPTOMS THAT SHOULD BE REPORTED IMMEDIATELY: *FEVER GREATER THAN 100.4 F (38 C) OR HIGHER *CHILLS OR SWEATING *NAUSEA AND VOMITING THAT IS NOT CONTROLLED WITH YOUR NAUSEA MEDICATION *UNUSUAL SHORTNESS OF BREATH *UNUSUAL BRUISING OR BLEEDING *URINARY PROBLEMS (pain or burning when urinating, or frequent urination) *BOWEL PROBLEMS (unusual diarrhea, constipation, pain near the anus) TENDERNESS IN MOUTH AND THROAT WITH OR WITHOUT PRESENCE OF ULCERS (sore throat, sores in mouth, or a toothache) UNUSUAL RASH, SWELLING OR PAIN  UNUSUAL VAGINAL DISCHARGE OR ITCHING   Items with * indicate a potential emergency and should be followed up as soon as possible or go to the Emergency Department if any problems should occur.  Please show the CHEMOTHERAPY ALERT CARD or IMMUNOTHERAPY ALERT CARD at check-in to the  Emergency Department and triage nurse. Should you have questions after your visit or need to cancel or reschedule your appointment, please contact [facility]  [phone] and follow the prompts.  Office hours are 8:00 a.m. to 4:30 p.m. Monday - Friday. Please note that voicemails left after 4:00 p.m. may not be returned until the following business day.  We are closed weekends and major holidays. You have access to a nurse at all times for urgent questions. Please call the main number to the clinic [phone] and follow the prompts.  For any non-urgent questions, you may also contact your provider using MyChart. We now offer e-Visits for anyone 55 and older to request care online for non-urgent symptoms. For details visit [email or web address].   Also download the MyChart app! Go to the app store, search "MyChart", open the app, select [facility], and log in with your MyChart username and password.  Due to Covid, a mask is required upon entering the hospital/clinic. If you do not have a mask, one will be given to you upon arrival. For doctor visits, patients may have 1 support person aged 50 or older with them. For treatment visits, patients cannot have anyone with them due to current Covid guidelines and our immunocompromised population.

## 2021-09-29 ENCOUNTER — Other Ambulatory Visit: Payer: Self-pay | Admitting: Radiation Therapy

## 2021-09-29 DIAGNOSIS — C7931 Secondary malignant neoplasm of brain: Secondary | ICD-10-CM

## 2021-09-30 ENCOUNTER — Other Ambulatory Visit: Payer: Self-pay | Admitting: Radiation Therapy

## 2021-09-30 DIAGNOSIS — C7931 Secondary malignant neoplasm of brain: Secondary | ICD-10-CM

## 2021-09-30 NOTE — Addendum Note (Signed)
Addended by: Pincus Large on: 09/30/2021 11:39 AM   Modules accepted: Orders

## 2021-10-01 ENCOUNTER — Encounter: Payer: Self-pay | Admitting: *Deleted

## 2021-10-01 ENCOUNTER — Telehealth: Payer: Self-pay | Admitting: *Deleted

## 2021-10-01 NOTE — Telephone Encounter (Signed)
Called and lvm of upcoming appointments - requested callback to confirm 

## 2021-10-01 NOTE — Telephone Encounter (Signed)
Per 09/23/21 los - called and lvm upcoming appointments - requested call back to confirm

## 2021-10-02 ENCOUNTER — Encounter: Payer: Self-pay | Admitting: Hematology & Oncology

## 2021-10-02 NOTE — Progress Notes (Signed)
Oncology Nurse Navigator Documentation  Oncology Nurse Navigator Flowsheets 09/23/2021  Abnormal Finding Date -  Diagnosis Status -  Planned Course of Treatment -  Phase of Treatment -  Chemotherapy Pending- Reason: -  Chemotherapy Actual Start Date: -  Chemotherapy Expected End Date: -  Radiation Actual Start Date: -  Radiation Actual End Date: -  Navigator Follow Up Date: 10/14/2021  Navigator Follow Up Reason: Follow-up Appointment;Chemotherapy  Navigator Location [facility]  Referral Date to RadOnc/MedOnc -  Navigator Encounter Type Treatment;Appt/Treatment Plan Review  Telephone -  Treatment Initiated Date -  Patient Visit Type MedOnc  Treatment Phase Active Tx  Barriers/Navigation Needs Coordination of Care;Education  Education -  Interventions Psycho-Social Support  Acuity Level 2-Minimal Needs (1-2 Barriers Identified)  Coordination of Care -  Education Method -  Support Groups/Services Friends and Family  Time Spent with Patient 15

## 2021-10-07 NOTE — Progress Notes (Signed)
Changing dose of Opdivo to 360 mg q3w per Dr. Antonieta Pert instructions. ? ?

## 2021-10-14 ENCOUNTER — Encounter: Payer: Self-pay | Admitting: *Deleted

## 2021-10-14 ENCOUNTER — Other Ambulatory Visit: Payer: Self-pay

## 2021-10-14 ENCOUNTER — Inpatient Hospital Stay: Payer: 59

## 2021-10-14 ENCOUNTER — Inpatient Hospital Stay: Payer: 59 | Attending: Internal Medicine

## 2021-10-14 ENCOUNTER — Encounter: Payer: Self-pay | Admitting: Family

## 2021-10-14 ENCOUNTER — Inpatient Hospital Stay (HOSPITAL_BASED_OUTPATIENT_CLINIC_OR_DEPARTMENT_OTHER): Payer: 59 | Admitting: Family

## 2021-10-14 VITALS — HR 74 | Temp 98.0°F | Resp 17 | Ht 70.0 in | Wt 292.0 lb

## 2021-10-14 DIAGNOSIS — Z5111 Encounter for antineoplastic chemotherapy: Secondary | ICD-10-CM | POA: Diagnosis present

## 2021-10-14 DIAGNOSIS — C3411 Malignant neoplasm of upper lobe, right bronchus or lung: Secondary | ICD-10-CM | POA: Insufficient documentation

## 2021-10-14 DIAGNOSIS — E032 Hypothyroidism due to medicaments and other exogenous substances: Secondary | ICD-10-CM

## 2021-10-14 DIAGNOSIS — C7970 Secondary malignant neoplasm of unspecified adrenal gland: Secondary | ICD-10-CM | POA: Insufficient documentation

## 2021-10-14 DIAGNOSIS — C3491 Malignant neoplasm of unspecified part of right bronchus or lung: Secondary | ICD-10-CM

## 2021-10-14 DIAGNOSIS — Z5189 Encounter for other specified aftercare: Secondary | ICD-10-CM | POA: Diagnosis not present

## 2021-10-14 DIAGNOSIS — C7931 Secondary malignant neoplasm of brain: Secondary | ICD-10-CM | POA: Insufficient documentation

## 2021-10-14 DIAGNOSIS — Z5112 Encounter for antineoplastic immunotherapy: Secondary | ICD-10-CM | POA: Insufficient documentation

## 2021-10-14 LAB — CBC WITH DIFFERENTIAL (CANCER CENTER ONLY)
Abs Immature Granulocytes: 0.03 10*3/uL (ref 0.00–0.07)
Basophils Absolute: 0 10*3/uL (ref 0.0–0.1)
Basophils Relative: 1 %
Eosinophils Absolute: 0 10*3/uL (ref 0.0–0.5)
Eosinophils Relative: 0 %
HCT: 32.4 % — ABNORMAL LOW (ref 39.0–52.0)
Hemoglobin: 11.1 g/dL — ABNORMAL LOW (ref 13.0–17.0)
Immature Granulocytes: 1 %
Lymphocytes Relative: 18 %
Lymphs Abs: 1 10*3/uL (ref 0.7–4.0)
MCH: 34.5 pg — ABNORMAL HIGH (ref 26.0–34.0)
MCHC: 34.3 g/dL (ref 30.0–36.0)
MCV: 100.6 fL — ABNORMAL HIGH (ref 80.0–100.0)
Monocytes Absolute: 0.6 10*3/uL (ref 0.1–1.0)
Monocytes Relative: 11 %
Neutro Abs: 3.9 10*3/uL (ref 1.7–7.7)
Neutrophils Relative %: 69 %
Platelet Count: 107 10*3/uL — ABNORMAL LOW (ref 150–400)
RBC: 3.22 MIL/uL — ABNORMAL LOW (ref 4.22–5.81)
RDW: 18.7 % — ABNORMAL HIGH (ref 11.5–15.5)
WBC Count: 5.5 10*3/uL (ref 4.0–10.5)
nRBC: 0 % (ref 0.0–0.2)

## 2021-10-14 LAB — CMP (CANCER CENTER ONLY)
ALT: 19 U/L (ref 0–44)
AST: 18 U/L (ref 15–41)
Albumin: 3.8 g/dL (ref 3.5–5.0)
Alkaline Phosphatase: 60 U/L (ref 38–126)
Anion gap: 7 (ref 5–15)
BUN: 13 mg/dL (ref 6–20)
CO2: 27 mmol/L (ref 22–32)
Calcium: 8.6 mg/dL — ABNORMAL LOW (ref 8.9–10.3)
Chloride: 104 mmol/L (ref 98–111)
Creatinine: 1.04 mg/dL (ref 0.61–1.24)
GFR, Estimated: >60 mL/min (ref >60)
Glucose, Bld: 86 mg/dL (ref 70–99)
Potassium: 3.6 mmol/L (ref 3.5–5.1)
Sodium: 138 mmol/L (ref 135–145)
Total Bilirubin: 0.5 mg/dL (ref 0.3–1.2)
Total Protein: 6.8 g/dL (ref 6.5–8.1)

## 2021-10-14 LAB — LACTATE DEHYDROGENASE: LDH: 216 U/L — ABNORMAL HIGH (ref 98–192)

## 2021-10-14 LAB — TSH: TSH: 1.685 u[IU]/mL (ref 0.320–4.118)

## 2021-10-14 MED ORDER — SODIUM CHLORIDE 0.9% FLUSH
10.0000 mL | INTRAVENOUS | Status: DC | PRN
  Administered 2021-10-14: 10 mL

## 2021-10-14 MED ORDER — SODIUM CHLORIDE 0.9 % IV SOLN
67.5000 mg/m2 | Freq: Once | INTRAVENOUS | Status: AC
  Administered 2021-10-14: 170 mg via INTRAVENOUS
  Filled 2021-10-14: qty 17

## 2021-10-14 MED ORDER — HEPARIN SOD (PORK) LOCK FLUSH 100 UNIT/ML IV SOLN
500.0000 [IU] | Freq: Once | INTRAVENOUS | Status: AC | PRN
  Administered 2021-10-14: 500 [IU]

## 2021-10-14 MED ORDER — HEPARIN SOD (PORK) LOCK FLUSH 100 UNIT/ML IV SOLN: 500.0000 [IU] | Freq: Once | INTRAVENOUS | Status: DC | PRN

## 2021-10-14 MED ORDER — SODIUM CHLORIDE 0.9 % IV SOLN: Freq: Once | INTRAVENOUS | Status: AC

## 2021-10-14 MED ORDER — PALONOSETRON HCL INJECTION 0.25 MG/5ML
0.2500 mg | Freq: Once | INTRAVENOUS | Status: AC
  Administered 2021-10-14: 0.25 mg via INTRAVENOUS
  Filled 2021-10-14: qty 5

## 2021-10-14 MED ORDER — SODIUM CHLORIDE 0.9% FLUSH: 10.0000 mL | INTRAVENOUS | Status: DC | PRN

## 2021-10-14 MED ORDER — SODIUM CHLORIDE 0.9 % IV SOLN
10.0000 mg | Freq: Once | INTRAVENOUS | Status: AC
  Administered 2021-10-14: 10 mg via INTRAVENOUS
  Filled 2021-10-14: qty 10

## 2021-10-14 MED ORDER — SODIUM CHLORIDE 0.9 % IV SOLN
740.0000 mg | Freq: Once | INTRAVENOUS | Status: AC
  Administered 2021-10-14: 740 mg via INTRAVENOUS
  Filled 2021-10-14: qty 74

## 2021-10-14 MED ORDER — COLD PACK MISC ONCOLOGY: 1.0000 | Freq: Once | Status: DC | PRN

## 2021-10-14 MED ORDER — SODIUM CHLORIDE 0.9 % IV SOLN
360.0000 mg | Freq: Once | INTRAVENOUS | Status: AC
  Administered 2021-10-14: 360 mg via INTRAVENOUS
  Filled 2021-10-14: qty 24

## 2021-10-14 MED ORDER — SODIUM CHLORIDE 0.9 % IV SOLN
150.0000 mg | Freq: Once | INTRAVENOUS | Status: AC
  Administered 2021-10-14: 150 mg via INTRAVENOUS
  Filled 2021-10-14: qty 150

## 2021-10-14 NOTE — Progress Notes (Signed)
Patient here for cycle 5. He will need a PET after his 6th cycle.  ? ?Oncology Nurse Navigator Documentation ? ?Oncology Nurse Navigator Flowsheets 10/14/2021  ?Abnormal Finding Date -  ?Diagnosis Status -  ?Planned Course of Treatment -  ?Phase of Treatment -  ?Chemotherapy Pending- Reason: -  ?Chemotherapy Actual Start Date: -  ?Chemotherapy Expected End Date: -  ?Radiation Actual Start Date: -  ?Radiation Actual End Date: -  ?Navigator Follow Up Date: 11/04/2021  ?Navigator Follow Up Reason: Follow-up Appointment;Chemotherapy  ?Navigator Location [facility]  ?Referral Date to RadOnc/MedOnc -  ?Navigator Encounter Type Treatment;Appt/Treatment Plan Review  ?Telephone -  ?Treatment Initiated Date -  ?Patient Visit Type MedOnc  ?Treatment Phase Active Tx  ?Barriers/Navigation Needs Coordination of Care;Education  ?Education -  ?Interventions Psycho-Social Support  ?Acuity Level 2-Minimal Needs (1-2 Barriers Identified)  ?Coordination of Care -  ?Education Method -  ?Support Groups/Services Friends and Family  ?Time Spent with Patient 15  ?  ?

## 2021-10-14 NOTE — Progress Notes (Signed)
?Hematology and Oncology Follow Up Visit ? ?Kevin Hunter ?[id number] ?25-Jul-1966 56 y.o. ?10/14/2021 ? ? 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  ?Current Therapy:        ?Status post radiosurgery for CNS metastasis -- 06/12/2021 ?Carbo/Taxotere/Opdivo -- s/p cycle 4 -- start on 06/23/2021 ?  ?Interim History:  Kevin Hunter is here today for follow-up and treatment. He is doing well but has some mild fatigue with exertion. He takes a break to rest as needed.  ?He has been walking some for exercise.  ?Appetite is good and he is doing his best to stay well hydrated. His weight is stable at 292 lbs.  ?No fever, chills, n/v, cough, rash, dizziness, SOB, chest pain, palpitations, abdominal pain or changes in bowel or bladder habits.  ?No blood loss noted. No bruising or petechiae.  ?No swelling, tenderness, numbness or tingling in his extremities.  ?No falls or syncope to report.  ? ?ECOG Performance Status: 1 - Symptomatic but completely ambulatory ? ?Medications:  ?Allergies as of 10/14/2021   ? ?   Reactions  ? Paclitaxel Anaphylaxis  ? Penicillins Other (See Comments)  ? Unknown Reaction when he was an infant.  ? ?  ? ?  ?Medication List  ?  ? ?  ? Accurate as of October 14, 2021  8:32 AM. If you have any questions, ask your nurse or doctor.  ?  ?  ? ?  ? ?amLODipine 5 MG tablet ?Commonly known as: NORVASC ?Take 5 mg by mouth at bedtime. ?  ?clonazePAM 1 MG tablet ?Commonly known as: KLONOPIN ?Take 1 mg by mouth 2 (two) times daily as needed for anxiety (May take additional 0.5mg  as needed for panic attack). ?  ?dexamethasone 4 MG tablet ?Commonly known as: DECADRON ?Take 1 tablet (4 mg total) by mouth 2 (two) times daily. ?  ?EPINEPHrine 0.3 mg/0.3 mL Soaj injection ?Commonly known as: EPI-PEN ?Inject 0.3 mg into the muscle as needed for anaphylaxis. ?  ?gabapentin 300 MG capsule ?Commonly known as:  NEURONTIN ?Take 300 mg by mouth daily. ?  ?ibuprofen 200 MG tablet ?Commonly known as: ADVIL ?Take 800 mg by mouth every 6 (six) hours as needed for headache. ?  ?LamoTRIgine 300 MG Tb24 24 hour tablet ?Take 300 mg by mouth daily. ?  ?levETIRAcetam 500 MG tablet ?Commonly known as: KEPPRA ?Take 500 mg by mouth 2 (two) times daily. ?  ?predniSONE 20 MG tablet ?Commonly known as: DELTASONE ?Take 4 tablets (80 mg total) by mouth daily with breakfast. ?  ?pseudoephedrine 30 MG tablet ?Commonly known as: SUDAFED ?Take 60 mg by mouth every 4 (four) hours as needed (headache). ?  ?psyllium 58.6 % packet ?Commonly known as: METAMUCIL ?Take 1 packet by mouth daily. ?  ?QUEtiapine 400 MG 24 hr tablet ?Commonly known as: SEROQUEL XR ?Take 400 mg by mouth at bedtime. ?  ?tolterodine 4 MG 24 hr capsule ?Commonly known as: DETROL LA ?Take 4 mg by mouth at bedtime. ?  ?traZODone 100 MG tablet ?Commonly known as: DESYREL ?Take 200 mg by mouth at bedtime. ?  ?venlafaxine 75 MG tablet ?Commonly known as: EFFEXOR ?Take 150 mg by mouth at bedtime. ?  ? ?  ? ? ?Allergies:  ?Allergies  ?Allergen Reactions  ? Paclitaxel Anaphylaxis  ? Penicillins Other (See Comments)  ?  Unknown Reaction when he was an infant.  ? ? ?Past Medical History, Surgical history,  Social history, and Family History were reviewed and updated. ? ?Review of Systems: ?All other 10 point review of systems is negative.  ? ?Physical Exam: ? vitals were not taken for this visit.  ? ?Wt Readings from Last 3 Encounters:  ?09/23/21 294 lb 1 oz (133.4 kg)  ?09/02/21 291 lb (132 kg)  ?07/14/21 279 lb (126.6 kg)  ? ? ?Ocular: Sclerae unicteric, pupils equal, round and reactive to light ?Ear-nose-throat: Oropharynx clear, dentition fair ?Lymphatic: No cervical or supraclavicular adenopathy ?Lungs no rales or rhonchi, good excursion bilaterally ?Heart regular rate and rhythm, no murmur appreciated ?Abd soft, nontender, positive bowel sounds ?MSK no focal spinal tenderness, no  joint edema ?Neuro: non-focal, well-oriented, appropriate affect ?Breasts: Deferred  ? ?Lab Results  ?Component Value Date  ? WBC 5.5 10/14/2021  ? HGB 11.1 (L) 10/14/2021  ? HCT 32.4 (L) 10/14/2021  ? MCV 100.6 (H) 10/14/2021  ? PLT 107 (L) 10/14/2021  ? ?No results found for: FERRITIN, IRON, TIBC, UIBC, IRONPCTSAT ?Lab Results  ?Component Value Date  ? RBC 3.22 (L) 10/14/2021  ? ?No results found for: KPAFRELGTCHN, LAMBDASER, KAPLAMBRATIO ?No results found for: IGGSERUM, IGA, IGMSERUM ?No results found for: TOTALPROTELP, ALBUMINELP, A1GS, A2GS, BETS, BETA2SER, GAMS, MSPIKE, SPEI ?  Chemistry   ?   ?Component Value Date/Time  ? NA 137 09/23/2021 0816  ? K 3.7 09/23/2021 0816  ? CL 103 09/23/2021 0816  ? CO2 28 09/23/2021 0816  ? BUN 13 09/23/2021 0816  ? CREATININE 1.05 09/23/2021 0816  ?    ?Component Value Date/Time  ? CALCIUM 9.1 09/23/2021 0816  ? ALKPHOS 69 09/23/2021 0816  ? AST 16 09/23/2021 0816  ? ALT 19 09/23/2021 0816  ? BILITOT 0.4 09/23/2021 0816  ?  ? ? ? ?Impression and Plan: 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  ?We will proceed with treatment cycle 5 today as planned.  ?We will plan to repeat his PET scan after cycle 6 to assess response.  ?Follow-up in 3 weeks.  ? ?Lottie Dawson, NP ?3/1/20238:32 AM ? ?

## 2021-10-14 NOTE — Patient Instructions (Signed)
[facility]  Discharge Instructions: ?Thank you for choosing [facility] to provide your oncology and hematology care.  ? ?If you have a lab appointment with the [facility], please go directly to the [facility] and check in at the registration area. ? ?Wear comfortable clothing and clothing appropriate for easy access to any Portacath or PICC line.  ? ?We strive to give you quality time with your provider. You may need to reschedule your appointment if you arrive late (15 or more minutes).  Arriving late affects you and other patients whose appointments are after yours.  Also, if you miss three or more appointments without notifying the office, you may be dismissed from the clinic at the provider?s discretion.    ?  ?For prescription refill requests, have your pharmacy contact our office and allow 72 hours for refills to be completed.   ? ?Today you received the following chemotherapy and/or immunotherapy agents taxotere, carboplatin, opdivo ?    ?  ?To help prevent nausea and vomiting after your treatment, we encourage you to take your nausea medication as directed. ? ?BELOW ARE SYMPTOMS THAT SHOULD BE REPORTED IMMEDIATELY: ?*FEVER GREATER THAN 100.4 F (38 ?C) OR HIGHER ?*CHILLS OR SWEATING ?*NAUSEA AND VOMITING THAT IS NOT CONTROLLED WITH YOUR NAUSEA MEDICATION ?*UNUSUAL SHORTNESS OF BREATH ?*UNUSUAL BRUISING OR BLEEDING ?*URINARY PROBLEMS (pain or burning when urinating, or frequent urination) ?*BOWEL PROBLEMS (unusual diarrhea, constipation, pain near the anus) ?TENDERNESS IN MOUTH AND THROAT WITH OR WITHOUT PRESENCE OF ULCERS (sore throat, sores in mouth, or a toothache) ?UNUSUAL RASH, SWELLING OR PAIN  ?UNUSUAL VAGINAL DISCHARGE OR ITCHING  ? ?Items with * indicate a potential emergency and should be followed up as soon as possible or go to the Emergency Department if any problems should occur. ? ?Please show the CHEMOTHERAPY ALERT CARD or IMMUNOTHERAPY ALERT  CARD at check-in to the Emergency Department and triage nurse. ?Should you have questions after your visit or need to cancel or reschedule your appointment, please contact [facility]  [phone] and follow the prompts.  Office hours are 8:00 a.m. to 4:30 p.m. Monday - Friday. Please note that voicemails left after 4:00 p.m. may not be returned until the following business day.  We are closed weekends and major holidays. You have access to a nurse at all times for urgent questions. Please call the main number to the clinic [phone] and follow the prompts. ? ?For any non-urgent questions, you may also contact your provider using MyChart. We now offer e-Visits for anyone 76 and older to request care online for non-urgent symptoms. For details visit [email or web address]. ?  ?Also download the MyChart app! Go to the app store, search "MyChart", open the app, select [facility], and log in with your MyChart username and password. ? ?Due to Covid, a mask is required upon entering the hospital/clinic. If you do not have a mask, one will be given to you upon arrival. For doctor visits, patients may have 1 support person aged 35 or older with them. For treatment visits, patients cannot have anyone with them due to current Covid guidelines and our immunocompromised population.  ?

## 2021-10-16 ENCOUNTER — Encounter: Payer: Self-pay | Admitting: Hematology & Oncology

## 2021-10-16 ENCOUNTER — Inpatient Hospital Stay (HOSPITAL_BASED_OUTPATIENT_CLINIC_OR_DEPARTMENT_OTHER): Payer: 59 | Admitting: Hematology & Oncology

## 2021-10-16 ENCOUNTER — Other Ambulatory Visit: Payer: Self-pay

## 2021-10-16 ENCOUNTER — Inpatient Hospital Stay: Payer: 59

## 2021-10-16 VITALS — BP 111/63 | HR 95 | Temp 99.4°F | Resp 17 | Wt 292.0 lb

## 2021-10-16 VITALS — BP 111/63 | HR 95 | Temp 99.4°F

## 2021-10-16 DIAGNOSIS — C3491 Malignant neoplasm of unspecified part of right bronchus or lung: Secondary | ICD-10-CM | POA: Diagnosis not present

## 2021-10-16 DIAGNOSIS — Z5112 Encounter for antineoplastic immunotherapy: Secondary | ICD-10-CM | POA: Diagnosis not present

## 2021-10-16 MED ORDER — PEGFILGRASTIM-BMEZ 6 MG/0.6ML ~~LOC~~ SOSY
6.0000 mg | PREFILLED_SYRINGE | Freq: Once | SUBCUTANEOUS | Status: AC
  Administered 2021-10-16: 6 mg via SUBCUTANEOUS
  Filled 2021-10-16: qty 0.6

## 2021-10-16 NOTE — Progress Notes (Signed)
?Hematology and Oncology Follow Up Visit ? ?Kevin Hunter ?[id number] ?09/17/1965 56 y.o. ?10/16/2021 ? ? 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  ?Current Therapy:        ?Status post radiosurgery for CNS metastasis -- 06/12/2021 ?Carbo/Taxotere/Opdivo -- s/p cycle #5 -- start on 06/23/2021 ?  ?Interim History:  Kevin Hunter is here today for an unscheduled visit.  He called saying that he just was not feeling well.  He was having nausea at home.  He is not eating all that well.  He said that whenever he he ate he just did not taste well. ? ?I told him to try some water and baking soda mouth rinses.  To do this 6 times a day. ? ?Think that he is beginning to show that he is not can be able to tolerate much more chemotherapy. ? ?Think we are going to have to see about doing a PET scan on him to see how everything looks. ? ?Looks like he had MRI of the brain back in 09/15/2021.  This showed improvement in the 3 brain metastasis. ? ?He has had no diarrhea.  He has had no rashes.  There is been no leg swelling.  He has had no issues with visual changes. ? ?He does not think he needs any fluids today. ? ?Overall, I would say his performance status is probably ECOG 1. ? ? ?Medications:  ?Allergies as of 10/16/2021   ? ?   Reactions  ? Paclitaxel Anaphylaxis  ? Penicillins Other (See Comments)  ? Unknown Reaction when he was an infant.  ? ?  ? ?  ?Medication List  ?  ? ?  ? Accurate as of October 16, 2021  4:02 PM. If you have any questions, ask your nurse or doctor.  ?  ?  ? ?  ? ?amLODipine 5 MG tablet ?Commonly known as: NORVASC ?Take 5 mg by mouth at bedtime. ?  ?clonazePAM 1 MG tablet ?Commonly known as: KLONOPIN ?Take 1 mg by mouth 2 (two) times daily as needed for anxiety (May take additional 0.5mg  as needed for panic attack). ?  ?dexamethasone 4 MG tablet ?Commonly known as: DECADRON ?Take 1 tablet (4 mg total)  by mouth 2 (two) times daily. ?  ?EPINEPHrine 0.3 mg/0.3 mL Soaj injection ?Commonly known as: EPI-PEN ?Inject 0.3 mg into the muscle as needed for anaphylaxis. ?  ?gabapentin 300 MG capsule ?Commonly known as: NEURONTIN ?Take 300 mg by mouth daily. ?  ?ibuprofen 200 MG tablet ?Commonly known as: ADVIL ?Take 800 mg by mouth every 6 (six) hours as needed for headache. ?  ?LamoTRIgine 300 MG Tb24 24 hour tablet ?Take 300 mg by mouth daily. ?  ?levETIRAcetam 500 MG tablet ?Commonly known as: KEPPRA ?Take 500 mg by mouth 2 (two) times daily. ?  ?predniSONE 20 MG tablet ?Commonly known as: DELTASONE ?Take 4 tablets (80 mg total) by mouth daily with breakfast. ?  ?pseudoephedrine 30 MG tablet ?Commonly known as: SUDAFED ?Take 60 mg by mouth every 4 (four) hours as needed (headache). ?  ?psyllium 58.6 % packet ?Commonly known as: METAMUCIL ?Take 1 packet by mouth daily. ?  ?QUEtiapine 400 MG 24 hr tablet ?Commonly known as: SEROQUEL XR ?Take 400 mg by mouth at bedtime. ?  ?tolterodine 4 MG 24 hr capsule ?Commonly known as: DETROL LA ?Take 4 mg by mouth at bedtime. ?  ?traZODone 100 MG tablet ?Commonly  known as: DESYREL ?Take 200 mg by mouth at bedtime. ?  ?venlafaxine 75 MG tablet ?Commonly known as: EFFEXOR ?Take 150 mg by mouth at bedtime. ?  ? ?  ? ? ?Allergies:  ?Allergies  ?Allergen Reactions  ? Paclitaxel Anaphylaxis  ? Penicillins Other (See Comments)  ?  Unknown Reaction when he was an infant.  ? ? ?Past Medical History, Surgical history, Social history, and Family History were reviewed and updated. ? ?Review of Systems: ?Review of Systems  ?Constitutional:  Positive for malaise/fatigue.  ?HENT:  Positive for sore throat.   ?Eyes: Negative.   ?Respiratory: Negative.    ?Cardiovascular: Negative.   ?Gastrointestinal:  Positive for nausea.  ?Genitourinary: Negative.   ?Musculoskeletal: Negative.   ?Skin: Negative.   ?Neurological: Negative.   ?Endo/Heme/Allergies: Negative.   ?Psychiatric/Behavioral: Negative.     ? ? ?Physical Exam: ? weight is 292 lb (132.5 kg). His oral temperature is 99.4 ?F (37.4 ?C). His blood pressure is 111/63 and his pulse is 95. His respiration is 17 and oxygen saturation is 97%.  ? ?Wt Readings from Last 3 Encounters:  ?10/16/21 292 lb (132.5 kg)  ?10/14/21 292 lb (132.5 kg)  ?09/23/21 294 lb 1 oz (133.4 kg)  ? ?This is a well-developed well-nourished white male in no obvious distress.  Head and neck exam shows no ocular or oral lesions.  He has no obvious mucositis.  There is no adenopathy in the neck.  Lungs are clear to percussion and auscultation bilaterally.  Cardiac exam regular rate and rhythm with no murmurs, rubs or bruits.  Abdomen is soft.  He has good bowel sounds.  There is no fluid wave.  There is no palpable liver or spleen tip.  Extremity shows no clubbing, cyanosis or edema.  He has good range of motion of his joints.  He has good strength in upper and lower extremities.  Skin exam shows no rashes, ecchymosis or petechia.  Neurological exam shows no focal neurological deficits. ? ? ? ?Lab Results  ?Component Value Date  ? WBC 5.5 10/14/2021  ? HGB 11.1 (L) 10/14/2021  ? HCT 32.4 (L) 10/14/2021  ? MCV 100.6 (H) 10/14/2021  ? PLT 107 (L) 10/14/2021  ? ?No results found for: FERRITIN, IRON, TIBC, UIBC, IRONPCTSAT ?Lab Results  ?Component Value Date  ? RBC 3.22 (L) 10/14/2021  ? ?No results found for: KPAFRELGTCHN, LAMBDASER, KAPLAMBRATIO ?No results found for: IGGSERUM, IGA, IGMSERUM ?No results found for: TOTALPROTELP, ALBUMINELP, A1GS, A2GS, BETS, BETA2SER, GAMS, MSPIKE, SPEI ?  Chemistry   ?   ?Component Value Date/Time  ? NA 138 10/14/2021 0810  ? K 3.6 10/14/2021 0810  ? CL 104 10/14/2021 0810  ? CO2 27 10/14/2021 0810  ? BUN 13 10/14/2021 0810  ? CREATININE 1.04 10/14/2021 0810  ?    ?Component Value Date/Time  ? CALCIUM 8.6 (L) 10/14/2021 0810  ? ALKPHOS 60 10/14/2021 0810  ? AST 18 10/14/2021 0810  ? ALT 19 10/14/2021 0810  ? BILITOT 0.5 10/14/2021 0810  ?  ? ? ? 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  ? ?At this point, we will go ahead with our PET scan.  We will see how everything looks.  After this, hopefully we can put him on adjuvant immunotherapy.  I would like to think that he should be able to tolerate this. ? ?He does not look all that bad.  He does not need IV fluids.  I do not think we need to do labs on him today. ? ?We will plan for his PET scan to be done in 2 or 3 weeks.  We will then get him back afterwards and let see how we can try to continue to help his malignancy. ? ? ?[name], MD ?3/3/20234:02 PM ? ?

## 2021-10-16 NOTE — Patient Instructions (Signed)

## 2021-10-20 ENCOUNTER — Encounter: Payer: Self-pay | Admitting: *Deleted

## 2021-10-20 ENCOUNTER — Telehealth: Payer: Self-pay | Admitting: *Deleted

## 2021-10-20 ENCOUNTER — Encounter: Payer: Self-pay | Admitting: Hematology & Oncology

## 2021-10-20 NOTE — Telephone Encounter (Signed)
Per 10/14/21 los - called and gave upcoming appointments - requested call back to confirm ?

## 2021-10-20 NOTE — Progress Notes (Signed)
See MyChart communication.  ? ?PET scan scheduled for 11/03/2021. Instruction sent to patient via [facility]. Message sent to scheduling to reschedule current appointment.  ? ?Oncology Nurse Navigator Documentation ? ?Oncology Nurse Navigator Flowsheets 10/20/2021  ?Abnormal Finding Date -  ?Diagnosis Status -  ?Planned Course of Treatment -  ?Phase of Treatment -  ?Chemotherapy Pending- Reason: -  ?Chemotherapy Actual Start Date: -  ?Chemotherapy Expected End Date: -  ?Radiation Actual Start Date: -  ?Radiation Actual End Date: -  ?Navigator Follow Up Date: 11/03/2021  ?Navigator Follow Up Reason: Scan Review  ?Navigator Location [facility]  ?Referral Date to RadOnc/MedOnc -  ?Navigator Encounter Type MyChart  ?Telephone -  ?Treatment Initiated Date -  ?Patient Visit Type MedOnc  ?Treatment Phase Active Tx  ?Barriers/Navigation Needs Coordination of Care;Education  ?Education Other  ?Interventions Coordination of Care;Education  ?Acuity Level 2-Minimal Needs (1-2 Barriers Identified)  ?Coordination of Care Radiology  ?Education Method Written  ?Support Groups/Services Friends and Family  ?Time Spent with Patient 30  ?  ?

## 2021-11-03 ENCOUNTER — Ambulatory Visit (HOSPITAL_COMMUNITY)
Admission: RE | Admit: 2021-11-03 | Discharge: 2021-11-03 | Disposition: A | Discharge status: Home / Self Care | Payer: 59 | Source: Ambulatory Visit | Attending: Hematology & Oncology | Admitting: Hematology & Oncology

## 2021-11-03 ENCOUNTER — Other Ambulatory Visit: Payer: Self-pay

## 2021-11-03 DIAGNOSIS — C3491 Malignant neoplasm of unspecified part of right bronchus or lung: Secondary | ICD-10-CM | POA: Diagnosis present

## 2021-11-03 LAB — GLUCOSE, CAPILLARY: Glucose-Capillary: 79 mg/dL (ref 70–99)

## 2021-11-03 MED ORDER — FLUDEOXYGLUCOSE F - 18 (FDG) INJECTION
15.6000 | Freq: Once | INTRAVENOUS | Status: AC | PRN
  Administered 2021-11-03: 15.6 via INTRAVENOUS

## 2021-11-04 ENCOUNTER — Encounter: Payer: Self-pay | Admitting: *Deleted

## 2021-11-04 ENCOUNTER — Ambulatory Visit: Payer: 59 | Admitting: Family

## 2021-11-04 ENCOUNTER — Other Ambulatory Visit: Payer: 59

## 2021-11-04 ENCOUNTER — Ambulatory Visit: Payer: 59

## 2021-11-04 ENCOUNTER — Inpatient Hospital Stay: Payer: 59

## 2021-11-04 ENCOUNTER — Other Ambulatory Visit: Payer: Self-pay

## 2021-11-04 ENCOUNTER — Inpatient Hospital Stay (HOSPITAL_BASED_OUTPATIENT_CLINIC_OR_DEPARTMENT_OTHER): Payer: 59 | Admitting: Hematology & Oncology

## 2021-11-04 ENCOUNTER — Encounter: Payer: Self-pay | Admitting: Hematology & Oncology

## 2021-11-04 VITALS — BP 112/74 | HR 78 | Temp 97.7°F | Resp 18 | Ht 70.0 in | Wt 291.0 lb

## 2021-11-04 DIAGNOSIS — C3491 Malignant neoplasm of unspecified part of right bronchus or lung: Secondary | ICD-10-CM

## 2021-11-04 DIAGNOSIS — Z5112 Encounter for antineoplastic immunotherapy: Secondary | ICD-10-CM | POA: Diagnosis not present

## 2021-11-04 DIAGNOSIS — E032 Hypothyroidism due to medicaments and other exogenous substances: Secondary | ICD-10-CM

## 2021-11-04 LAB — TSH: TSH: 2.176 u[IU]/mL (ref 0.320–4.118)

## 2021-11-04 LAB — CBC WITH DIFFERENTIAL (CANCER CENTER ONLY)
Abs Immature Granulocytes: 0.02 10*3/uL (ref 0.00–0.07)
Basophils Absolute: 0 10*3/uL (ref 0.0–0.1)
Basophils Relative: 1 %
Eosinophils Absolute: 0 10*3/uL (ref 0.0–0.5)
Eosinophils Relative: 0 %
HCT: 31.6 % — ABNORMAL LOW (ref 39.0–52.0)
Hemoglobin: 10.5 g/dL — ABNORMAL LOW (ref 13.0–17.0)
Immature Granulocytes: 0 %
Lymphocytes Relative: 24 %
Lymphs Abs: 1.1 10*3/uL (ref 0.7–4.0)
MCH: 34.4 pg — ABNORMAL HIGH (ref 26.0–34.0)
MCHC: 33.2 g/dL (ref 30.0–36.0)
MCV: 103.6 fL — ABNORMAL HIGH (ref 80.0–100.0)
Monocytes Absolute: 0.5 10*3/uL (ref 0.1–1.0)
Monocytes Relative: 10 %
Neutro Abs: 3.1 10*3/uL (ref 1.7–7.7)
Neutrophils Relative %: 65 %
Platelet Count: 104 10*3/uL — ABNORMAL LOW (ref 150–400)
RBC: 3.05 MIL/uL — ABNORMAL LOW (ref 4.22–5.81)
RDW: 19.3 % — ABNORMAL HIGH (ref 11.5–15.5)
WBC Count: 4.7 10*3/uL (ref 4.0–10.5)
nRBC: 0 % (ref 0.0–0.2)

## 2021-11-04 LAB — CMP (CANCER CENTER ONLY)
ALT: 18 U/L (ref 0–44)
AST: 16 U/L (ref 15–41)
Albumin: 4.1 g/dL (ref 3.5–5.0)
Alkaline Phosphatase: 66 U/L (ref 38–126)
Anion gap: 8 (ref 5–15)
BUN: 14 mg/dL (ref 6–20)
CO2: 26 mmol/L (ref 22–32)
Calcium: 8.9 mg/dL (ref 8.9–10.3)
Chloride: 103 mmol/L (ref 98–111)
Creatinine: 1.03 mg/dL (ref 0.61–1.24)
GFR, Estimated: >60 mL/min (ref >60)
Glucose, Bld: 86 mg/dL (ref 70–99)
Potassium: 3.8 mmol/L (ref 3.5–5.1)
Sodium: 137 mmol/L (ref 135–145)
Total Bilirubin: 0.4 mg/dL (ref 0.3–1.2)
Total Protein: 6.9 g/dL (ref 6.5–8.1)

## 2021-11-04 LAB — LACTATE DEHYDROGENASE: LDH: 204 U/L — ABNORMAL HIGH (ref 98–192)

## 2021-11-04 MED ORDER — HEPARIN SOD (PORK) LOCK FLUSH 100 UNIT/ML IV SOLN: 500.0000 [IU] | Freq: Once | INTRAVENOUS | Status: DC

## 2021-11-04 MED ORDER — SODIUM CHLORIDE 0.9% FLUSH: 10.0000 mL | Freq: Once | INTRAVENOUS | Status: DC

## 2021-11-04 NOTE — Patient Instructions (Signed)

## 2021-11-04 NOTE — Progress Notes (Signed)
?Hematology and Oncology Follow Up Visit ? ?Kevin Hunter ?[id number] ?07-20-1966 56 y.o. ?11/04/2021 ? ? 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  ?Current Therapy:        ?Status post radiosurgery for CNS metastasis -- 06/12/2021 ?Carbo/Taxotere/Opdivo -- s/p cycle #6 -- start on 06/23/2021 ?Opdivo 2oo mg IV q 3 weeks -- maintenance - start on 11/06/2021 ?  ?Interim History:  Kevin Hunter is here today for follow-up.  He is doing pretty well.  He had a PET scan done yesterday.  Unfortunately, we still do not have the results back from this. ? ?He has had no problems with cough.  He has had no nausea or vomiting.  He has had no pain.  He has had no change in bowel or bladder habits.  He has had no rashes. ? ?He is going to have an MRI of the brain I think in May.  Radiation Oncology is managing this. ? ?There is been no problems with bleeding. ? ?Overall, I would say his performance status is ECOG 1. ? ? ?Medications:  ?Allergies as of 11/04/2021   ? ?   Reactions  ? Paclitaxel Anaphylaxis  ? Penicillins Other (See Comments)  ? Unknown Reaction when he was an infant.  ? ?  ? ?  ?Medication List  ?  ? ?  ? Accurate as of November 04, 2021  8:55 AM. If you have any questions, ask your nurse or doctor.  ?  ?  ? ?  ? ?amLODipine 5 MG tablet ?Commonly known as: NORVASC ?Take 5 mg by mouth at bedtime. ?  ?clonazePAM 1 MG tablet ?Commonly known as: KLONOPIN ?Take 1 mg by mouth 2 (two) times daily as needed for anxiety (May take additional 0.5mg  as needed for panic attack). ?  ?dexamethasone 4 MG tablet ?Commonly known as: DECADRON ?Take 1 tablet (4 mg total) by mouth 2 (two) times daily. ?  ?EPINEPHrine 0.3 mg/0.3 mL Soaj injection ?Commonly known as: EPI-PEN ?Inject 0.3 mg into the muscle as needed for anaphylaxis. ?  ?gabapentin 300 MG capsule ?Commonly known as: NEURONTIN ?Take 300 mg by mouth daily. ?  ?ibuprofen  200 MG tablet ?Commonly known as: ADVIL ?Take 800 mg by mouth every 6 (six) hours as needed for headache. ?  ?LamoTRIgine 300 MG Tb24 24 hour tablet ?Take 300 mg by mouth daily. ?  ?levETIRAcetam 500 MG tablet ?Commonly known as: KEPPRA ?Take 500 mg by mouth 2 (two) times daily. ?  ?predniSONE 20 MG tablet ?Commonly known as: DELTASONE ?Take 4 tablets (80 mg total) by mouth daily with breakfast. ?  ?pseudoephedrine 30 MG tablet ?Commonly known as: SUDAFED ?Take 60 mg by mouth every 4 (four) hours as needed (headache). ?  ?psyllium 58.6 % packet ?Commonly known as: METAMUCIL ?Take 1 packet by mouth daily. ?  ?QUEtiapine 400 MG 24 hr tablet ?Commonly known as: SEROQUEL XR ?Take 400 mg by mouth at bedtime. ?  ?tolterodine 4 MG 24 hr capsule ?Commonly known as: DETROL LA ?Take 4 mg by mouth at bedtime. ?  ?traZODone 100 MG tablet ?Commonly known as: DESYREL ?Take 200 mg by mouth at bedtime. ?  ?venlafaxine 75 MG tablet ?Commonly known as: EFFEXOR ?Take 150 mg by mouth at bedtime. ?  ? ?  ? ? ?Allergies:  ?Allergies  ?Allergen Reactions  ? Paclitaxel Anaphylaxis  ? Penicillins Other (See Comments)  ?  Unknown Reaction when he  was an infant.  ? ? ?Past Medical History, Surgical history, Social history, and Family History were reviewed and updated. ? ?Review of Systems: ?Review of Systems  ?Constitutional:  Positive for malaise/fatigue.  ?HENT:  Positive for sore throat.   ?Eyes: Negative.   ?Respiratory: Negative.    ?Cardiovascular: Negative.   ?Gastrointestinal:  Positive for nausea.  ?Genitourinary: Negative.   ?Musculoskeletal: Negative.   ?Skin: Negative.   ?Neurological: Negative.   ?Endo/Heme/Allergies: Negative.   ?Psychiatric/Behavioral: Negative.    ? ? ?Physical Exam: ? vitals were not taken for this visit.  ? ?Wt Readings from Last 3 Encounters:  ?10/16/21 292 lb (132.5 kg)  ?10/14/21 292 lb (132.5 kg)  ?09/23/21 294 lb 1 oz (133.4 kg)  ? ?This is a well-developed well-nourished white male in no obvious  distress.  Head and neck exam shows no ocular or oral lesions.  He has no obvious mucositis.  There is no adenopathy in the neck.  Lungs are clear to percussion and auscultation bilaterally.  Cardiac exam regular rate and rhythm with no murmurs, rubs or bruits.  Abdomen is soft.  He has good bowel sounds.  There is no fluid wave.  There is no palpable liver or spleen tip.  Extremity shows no clubbing, cyanosis or edema.  He has good range of motion of his joints.  He has good strength in upper and lower extremities.  Skin exam shows no rashes, ecchymosis or petechia.  Neurological exam shows no focal neurological deficits. ? ? ? ?Lab Results  ?Component Value Date  ? WBC 4.7 11/04/2021  ? HGB 10.5 (L) 11/04/2021  ? HCT 31.6 (L) 11/04/2021  ? MCV 103.6 (H) 11/04/2021  ? PLT 104 (L) 11/04/2021  ? ?No results found for: FERRITIN, IRON, TIBC, UIBC, IRONPCTSAT ?Lab Results  ?Component Value Date  ? RBC 3.05 (L) 11/04/2021  ? ?No results found for: KPAFRELGTCHN, LAMBDASER, KAPLAMBRATIO ?No results found for: IGGSERUM, IGA, IGMSERUM ?No results found for: TOTALPROTELP, ALBUMINELP, A1GS, A2GS, BETS, BETA2SER, GAMS, MSPIKE, SPEI ?  Chemistry   ?   ?Component Value Date/Time  ? NA 137 11/04/2021 0818  ? K 3.8 11/04/2021 0818  ? CL 103 11/04/2021 0818  ? CO2 26 11/04/2021 0818  ? BUN 14 11/04/2021 0818  ? CREATININE 1.03 11/04/2021 0818  ?    ?Component Value Date/Time  ? CALCIUM 8.9 11/04/2021 0818  ? ALKPHOS 66 11/04/2021 0818  ? AST 16 11/04/2021 0818  ? ALT 18 11/04/2021 0818  ? BILITOT 0.4 11/04/2021 0818  ?  ? ? ? 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  ? ?I really wish that the PET scan results will be back by now.  I thought they would be but they are not. ? ?However, had to believe that he has responded to treatment. ? ?I think we can go with adjuvant immunotherapy now.  I think this would be very reasonable  way of trying to keep him in some kind of remission. ? ?We will do the Opdivo.  I think this is very reasonable to do. ? ?We will have the PET scan results back, I hope, tomorrow. ? ?I would like to plan to get him back for his next treatment in 3 weeks. ? ?After 4 cycles of Opdivo, we will repeat the PET scan and see how everything looks.  If everything is still looks good, then we will try  to do the Opdivo every 6 weeks.  I think this would make a lot of sense. ? ? ?[name], MD ?3/22/20238:55 AM ? ?

## 2021-11-05 ENCOUNTER — Telehealth: Payer: Self-pay | Admitting: *Deleted

## 2021-11-05 ENCOUNTER — Encounter: Payer: Self-pay | Admitting: Hematology & Oncology

## 2021-11-05 NOTE — Progress Notes (Signed)
PET shows response to treatment. He will start maintenance treatment.  ? ?Oncology Nurse Navigator Documentation ? ? ?  11/04/2021  ?  9:30 AM  ?Oncology Nurse Navigator Flowsheets  ?Navigator Follow Up Date: 12/02/2021  ?Navigator Follow Up Reason: Follow-up Appointment;Chemotherapy  ?Navigator Location [facility]  ?Navigator Encounter Type Treatment;Scan Review;Appt/Treatment Plan Review  ?Patient Visit Type MedOnc  ?Treatment Phase Active Tx  ?Barriers/Navigation Needs Coordination of Care;Education  ?Interventions Psycho-Social Support  ?Acuity Level 2-Minimal Needs (1-2 Barriers Identified)  ?Support Groups/Services Friends and Family  ?Time Spent with Patient 15  ?  ?

## 2021-11-05 NOTE — Telephone Encounter (Signed)
-----   Message from [name], MD sent at 11/05/2021 11:19 AM EDT ----- ?Please call and let him know that the tumor is still decreased in size.  This is very exciting.  I am so happy for him. ?

## 2021-11-05 NOTE — Telephone Encounter (Signed)
As noted below by Dr. Marin Olp, I left a message informing him that the tumor is still decreased in size. Instructed him to call the office if he has any questions or concerns. ?

## 2021-11-06 ENCOUNTER — Ambulatory Visit: Payer: 59

## 2021-11-06 ENCOUNTER — Encounter: Payer: Self-pay | Admitting: Hematology & Oncology

## 2021-11-11 ENCOUNTER — Inpatient Hospital Stay: Payer: 59

## 2021-11-11 ENCOUNTER — Other Ambulatory Visit: Payer: Self-pay

## 2021-11-11 VITALS — BP 128/85 | HR 86 | Resp 18

## 2021-11-11 DIAGNOSIS — Z5112 Encounter for antineoplastic immunotherapy: Secondary | ICD-10-CM | POA: Diagnosis not present

## 2021-11-11 DIAGNOSIS — C3491 Malignant neoplasm of unspecified part of right bronchus or lung: Secondary | ICD-10-CM

## 2021-11-11 MED ORDER — SODIUM CHLORIDE 0.9 % IV SOLN: Freq: Once | INTRAVENOUS | Status: AC

## 2021-11-11 MED ORDER — HEPARIN SOD (PORK) LOCK FLUSH 100 UNIT/ML IV SOLN
500.0000 [IU] | Freq: Once | INTRAVENOUS | Status: AC | PRN
  Administered 2021-11-11: 500 [IU]

## 2021-11-11 MED ORDER — SODIUM CHLORIDE 0.9% FLUSH
10.0000 mL | INTRAVENOUS | Status: DC | PRN
  Administered 2021-11-11: 10 mL

## 2021-11-11 MED ORDER — SODIUM CHLORIDE 0.9 % IV SOLN
360.0000 mg | Freq: Once | INTRAVENOUS | Status: AC
  Administered 2021-11-11: 360 mg via INTRAVENOUS
  Filled 2021-11-11: qty 24

## 2021-11-11 NOTE — Progress Notes (Signed)
Ok to use labs from 11/04/21 per Dr Marin Olp.   ?

## 2021-11-11 NOTE — Patient Instructions (Signed)
[facility]  Discharge Instructions: ?Thank you for choosing [facility] to provide your oncology and hematology care.  ? ?If you have a lab appointment with the [facility], please go directly to the [facility] and check in at the registration area. ? ?Wear comfortable clothing and clothing appropriate for easy access to any Portacath or PICC line.  ? ?We strive to give you quality time with your provider. You may need to reschedule your appointment if you arrive late (15 or more minutes).  Arriving late affects you and other patients whose appointments are after yours.  Also, if you miss three or more appointments without notifying the office, you may be dismissed from the clinic at the provider?s discretion.    ?  ?For prescription refill requests, have your pharmacy contact our office and allow 72 hours for refills to be completed.   ? ?Today you received the following chemotherapy and/or immunotherapy agents Opdivo    ?  ?To help prevent nausea and vomiting after your treatment, we encourage you to take your nausea medication as directed. ? ?BELOW ARE SYMPTOMS THAT SHOULD BE REPORTED IMMEDIATELY: ?*FEVER GREATER THAN 100.4 F (38 ?C) OR HIGHER ?*CHILLS OR SWEATING ?*NAUSEA AND VOMITING THAT IS NOT CONTROLLED WITH YOUR NAUSEA MEDICATION ?*UNUSUAL SHORTNESS OF BREATH ?*UNUSUAL BRUISING OR BLEEDING ?*URINARY PROBLEMS (pain or burning when urinating, or frequent urination) ?*BOWEL PROBLEMS (unusual diarrhea, constipation, pain near the anus) ?TENDERNESS IN MOUTH AND THROAT WITH OR WITHOUT PRESENCE OF ULCERS (sore throat, sores in mouth, or a toothache) ?UNUSUAL RASH, SWELLING OR PAIN  ?UNUSUAL VAGINAL DISCHARGE OR ITCHING  ? ?Items with * indicate a potential emergency and should be followed up as soon as possible or go to the Emergency Department if any problems should occur. ? ?Please show the CHEMOTHERAPY ALERT CARD or IMMUNOTHERAPY ALERT CARD at check-in to the  Emergency Department and triage nurse. ?Should you have questions after your visit or need to cancel or reschedule your appointment, please contact [facility]  [phone] and follow the prompts.  Office hours are 8:00 a.m. to 4:30 p.m. Monday - Friday. Please note that voicemails left after 4:00 p.m. may not be returned until the following business day.  We are closed weekends and major holidays. You have access to a nurse at all times for urgent questions. Please call the main number to the clinic [phone] and follow the prompts. ? ?For any non-urgent questions, you may also contact your provider using MyChart. We now offer e-Visits for anyone 59 and older to request care online for non-urgent symptoms. For details visit [email or web address]. ?  ?Also download the MyChart app! Go to the app store, search "MyChart", open the app, select [facility], and log in with your MyChart username and password. ? ?Due to Covid, a mask is required upon entering the hospital/clinic. If you do not have a mask, one will be given to you upon arrival. For doctor visits, patients may have 1 support person aged 42 or older with them. For treatment visits, patients cannot have anyone with them due to current Covid guidelines and our immunocompromised population.  ?

## 2021-12-02 ENCOUNTER — Encounter: Payer: Self-pay | Admitting: Hematology & Oncology

## 2021-12-02 ENCOUNTER — Encounter: Payer: Self-pay | Admitting: *Deleted

## 2021-12-02 ENCOUNTER — Inpatient Hospital Stay: Payer: 59

## 2021-12-02 ENCOUNTER — Other Ambulatory Visit: Payer: Self-pay

## 2021-12-02 ENCOUNTER — Inpatient Hospital Stay: Payer: 59 | Attending: Internal Medicine

## 2021-12-02 ENCOUNTER — Inpatient Hospital Stay (HOSPITAL_BASED_OUTPATIENT_CLINIC_OR_DEPARTMENT_OTHER): Payer: 59 | Admitting: Hematology & Oncology

## 2021-12-02 VITALS — BP 112/71 | HR 88 | Temp 97.5°F | Resp 18 | Ht 70.0 in | Wt 291.0 lb

## 2021-12-02 DIAGNOSIS — C3491 Malignant neoplasm of unspecified part of right bronchus or lung: Secondary | ICD-10-CM | POA: Diagnosis not present

## 2021-12-02 DIAGNOSIS — C7931 Secondary malignant neoplasm of brain: Secondary | ICD-10-CM | POA: Diagnosis not present

## 2021-12-02 DIAGNOSIS — C3411 Malignant neoplasm of upper lobe, right bronchus or lung: Secondary | ICD-10-CM | POA: Insufficient documentation

## 2021-12-02 DIAGNOSIS — Z79899 Other long term (current) drug therapy: Secondary | ICD-10-CM | POA: Insufficient documentation

## 2021-12-02 DIAGNOSIS — Z5112 Encounter for antineoplastic immunotherapy: Secondary | ICD-10-CM | POA: Insufficient documentation

## 2021-12-02 DIAGNOSIS — C7970 Secondary malignant neoplasm of unspecified adrenal gland: Secondary | ICD-10-CM | POA: Insufficient documentation

## 2021-12-02 DIAGNOSIS — R21 Rash and other nonspecific skin eruption: Secondary | ICD-10-CM | POA: Diagnosis not present

## 2021-12-02 LAB — CBC WITH DIFFERENTIAL (CANCER CENTER ONLY)
Abs Immature Granulocytes: 0.01 10*3/uL (ref 0.00–0.07)
Basophils Absolute: 0 10*3/uL (ref 0.0–0.1)
Basophils Relative: 1 %
Eosinophils Absolute: 0.1 10*3/uL (ref 0.0–0.5)
Eosinophils Relative: 3 %
HCT: 35.7 % — ABNORMAL LOW (ref 39.0–52.0)
Hemoglobin: 12.2 g/dL — ABNORMAL LOW (ref 13.0–17.0)
Immature Granulocytes: 0 %
Lymphocytes Relative: 27 %
Lymphs Abs: 1.3 10*3/uL (ref 0.7–4.0)
MCH: 34.9 pg — ABNORMAL HIGH (ref 26.0–34.0)
MCHC: 34.2 g/dL (ref 30.0–36.0)
MCV: 102 fL — ABNORMAL HIGH (ref 80.0–100.0)
Monocytes Absolute: 0.5 10*3/uL (ref 0.1–1.0)
Monocytes Relative: 11 %
Neutro Abs: 2.9 10*3/uL (ref 1.7–7.7)
Neutrophils Relative %: 58 %
Platelet Count: 111 10*3/uL — ABNORMAL LOW (ref 150–400)
RBC: 3.5 MIL/uL — ABNORMAL LOW (ref 4.22–5.81)
RDW: 14.8 % (ref 11.5–15.5)
WBC Count: 5 10*3/uL (ref 4.0–10.5)
nRBC: 0 % (ref 0.0–0.2)

## 2021-12-02 LAB — CMP (CANCER CENTER ONLY)
ALT: 21 U/L (ref 0–44)
AST: 17 U/L (ref 15–41)
Albumin: 4.2 g/dL (ref 3.5–5.0)
Alkaline Phosphatase: 62 U/L (ref 38–126)
Anion gap: 7 (ref 5–15)
BUN: 15 mg/dL (ref 6–20)
CO2: 26 mmol/L (ref 22–32)
Calcium: 9 mg/dL (ref 8.9–10.3)
Chloride: 104 mmol/L (ref 98–111)
Creatinine: 1.15 mg/dL (ref 0.61–1.24)
GFR, Estimated: >60 mL/min (ref >60)
Glucose, Bld: 102 mg/dL — ABNORMAL HIGH (ref 70–99)
Potassium: 3.9 mmol/L (ref 3.5–5.1)
Sodium: 137 mmol/L (ref 135–145)
Total Bilirubin: 0.6 mg/dL (ref 0.3–1.2)
Total Protein: 7.3 g/dL (ref 6.5–8.1)

## 2021-12-02 LAB — LACTATE DEHYDROGENASE: LDH: 215 U/L — ABNORMAL HIGH (ref 98–192)

## 2021-12-02 LAB — TSH: TSH: 2.66 u[IU]/mL (ref 0.350–4.500)

## 2021-12-02 MED ORDER — SODIUM CHLORIDE 0.9 % IV SOLN
360.0000 mg | Freq: Once | INTRAVENOUS | Status: AC
  Administered 2021-12-02: 360 mg via INTRAVENOUS
  Filled 2021-12-02: qty 24

## 2021-12-02 MED ORDER — HEPARIN SOD (PORK) LOCK FLUSH 100 UNIT/ML IV SOLN
500.0000 [IU] | Freq: Once | INTRAVENOUS | Status: AC | PRN
  Administered 2021-12-02: 500 [IU]

## 2021-12-02 MED ORDER — SILVER SULFADIAZINE 1 % EX CREA: 1.0000 "application " | TOPICAL_CREAM | Freq: Every day | CUTANEOUS | 0 refills | Status: DC

## 2021-12-02 MED ORDER — SODIUM CHLORIDE 0.9% FLUSH
10.0000 mL | INTRAVENOUS | Status: DC | PRN
  Administered 2021-12-02: 10 mL

## 2021-12-02 MED ORDER — SODIUM CHLORIDE 0.9 % IV SOLN: Freq: Once | INTRAVENOUS | Status: AC

## 2021-12-02 NOTE — Progress Notes (Signed)
Oncology Nurse Navigator Documentation ? ? ?  12/02/2021  ?  8:30 AM  ?Oncology Nurse Navigator Flowsheets  ?Navigator Follow Up Date: 12/23/2021  ?Navigator Follow Up Reason: Follow-up Appointment;Chemotherapy  ?Navigator Location [facility]  ?Navigator Encounter Type Treatment;Appt/Treatment Plan Review  ?Patient Visit Type MedOnc  ?Treatment Phase Active Tx  ?Barriers/Navigation Needs No Barriers At This Time  ?Interventions Psycho-Social Support  ?Acuity Level 1-No Barriers  ?Support Groups/Services Friends and Family  ?Time Spent with Patient 15  ?  ?

## 2021-12-02 NOTE — Patient Instructions (Signed)

## 2021-12-02 NOTE — Progress Notes (Signed)
?Hematology and Oncology Follow Up Visit ? ?Kevin Hunter ?[id number] ?[birth year] 56 y.o. ?12/02/2021 ? ? 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  ?Current Therapy:        ?Status post radiosurgery for CNS metastasis -- 06/12/2021 ?Carbo/Taxotere/Opdivo -- s/p cycle #6 -- start on 06/23/2021 ?Opdivo 200 mg IV q 3 weeks -- maintenance - s/p cycle #1 -- start on 11/06/2021 ?  ?Interim History:  Kevin Hunter is here today for follow-up.  Looks pretty good.  He feels pretty good.  He has had no problems with nausea or vomiting.  He has had little bit of a skin rash.  This seems to be an intertriginous areas.  We will try some Silvadene cream on this. ? ?He has had no diarrhea.  He has had no problems urinating. ? ?He does state that his vision does get black for a few seconds when he turns his head.  I am not sure exactly what this might be.  He is scheduled for an MRI of the brain for follow-up after his cranial radiation therapy in early May. ? ?He has had no cough.  There is no chest pain.  He has had no leg swelling. ? ?He did have a PET scan after we last saw him.  The PET scan, in general, show that there was decrease in his disease burden.  He is doing pretty well.   ? ?Overall, I would say his performance status is ECOG 1. ? ? ?Medications:  ?Allergies as of 12/02/2021   ? ?   Reactions  ? Paclitaxel Anaphylaxis  ? Penicillins Other (See Comments)  ? Unknown Reaction when he was an infant.  ? ?  ? ?  ?Medication List  ?  ? ?  ? Accurate as of December 02, 2021  8:14 AM. If you have any questions, ask your nurse or doctor.  ?  ?  ? ?  ? ?amLODipine 5 MG tablet ?Commonly known as: NORVASC ?Take 5 mg by mouth at bedtime. ?  ?clonazePAM 1 MG tablet ?Commonly known as: KLONOPIN ?Take 1 mg by mouth 2 (two) times daily as needed for anxiety (May take additional 0.5mg  as needed for panic attack). ?  ?ibuprofen 200  MG tablet ?Commonly known as: ADVIL ?Take 800 mg by mouth every 6 (six) hours as needed for headache. ?  ?pseudoephedrine 30 MG tablet ?Commonly known as: SUDAFED ?Take 60 mg by mouth every 4 (four) hours as needed (headache). ?  ?psyllium 58.6 % packet ?Commonly known as: METAMUCIL ?Take 1 packet by mouth daily. ?  ?QUEtiapine 400 MG 24 hr tablet ?Commonly known as: SEROQUEL XR ?Take 400 mg by mouth at bedtime. ?  ?tolterodine 4 MG 24 hr capsule ?Commonly known as: DETROL LA ?Take 4 mg by mouth at bedtime. ?  ?traZODone 100 MG tablet ?Commonly known as: DESYREL ?Take 200 mg by mouth at bedtime. ?  ?venlafaxine 75 MG tablet ?Commonly known as: EFFEXOR ?Take 150 mg by mouth at bedtime. ?  ? ?  ? ? ?Allergies:  ?Allergies  ?Allergen Reactions  ? Paclitaxel Anaphylaxis  ? Penicillins Other (See Comments)  ?  Unknown Reaction when he was an infant.  ? ? ?Past Medical History, Surgical history, Social history, and Family History were reviewed and updated. ? ?Review of Systems: ?Review of Systems  ?Constitutional:  Positive for malaise/fatigue.  ?HENT:  Positive for sore throat.   ?Eyes:  Negative.   ?Respiratory: Negative.    ?Cardiovascular: Negative.   ?Gastrointestinal:  Positive for nausea.  ?Genitourinary: Negative.   ?Musculoskeletal: Negative.   ?Skin: Negative.   ?Neurological: Negative.   ?Endo/Heme/Allergies: Negative.   ?Psychiatric/Behavioral: Negative.    ? ? ?Physical Exam: ? vitals were not taken for this visit.  ? ?Wt Readings from Last 3 Encounters:  ?11/04/21 291 lb (132 kg)  ?10/16/21 292 lb (132.5 kg)  ?10/14/21 292 lb (132.5 kg)  ? ?His vital signs are temperature of 97.5.  Pulse 88.  Blood pressure 112/71.  Weight is 291 pounds.   ? ?Physical Exam ?Vitals reviewed.  ?HENT:  ?   Head: Normocephalic and atraumatic.  ?Eyes:  ?   Pupils: Pupils are equal, round, and reactive to light.  ?Cardiovascular:  ?   Rate and Rhythm: Normal rate and regular rhythm.  ?   Heart sounds: Normal heart sounds.   ?Pulmonary:  ?   Effort: Pulmonary effort is normal.  ?   Breath sounds: Normal breath sounds.  ?Abdominal:  ?   General: Bowel sounds are normal.  ?   Palpations: Abdomen is soft.  ?Musculoskeletal:     ?   General: No tenderness or deformity. Normal range of motion.  ?   Cervical back: Normal range of motion.  ?Lymphadenopathy:  ?   Cervical: No cervical adenopathy.  ?Skin: ?   General: Skin is warm and dry.  ?   Findings: No erythema or rash.  ?Neurological:  ?   Mental Status: He is alert and oriented to person, place, and time.  ?Psychiatric:     ?   Behavior: Behavior normal.     ?   Thought Content: Thought content normal.     ?   Judgment: Judgment normal.  ? ? ? ?Lab Results  ?Component Value Date  ? WBC 4.7 11/04/2021  ? HGB 10.5 (L) 11/04/2021  ? HCT 31.6 (L) 11/04/2021  ? MCV 103.6 (H) 11/04/2021  ? PLT 104 (L) 11/04/2021  ? ?No results found for: FERRITIN, IRON, TIBC, UIBC, IRONPCTSAT ?Lab Results  ?Component Value Date  ? RBC 3.05 (L) 11/04/2021  ? ?No results found for: KPAFRELGTCHN, LAMBDASER, KAPLAMBRATIO ?No results found for: IGGSERUM, IGA, IGMSERUM ?No results found for: TOTALPROTELP, ALBUMINELP, A1GS, A2GS, BETS, BETA2SER, GAMS, MSPIKE, SPEI ?  Chemistry   ?   ?Component Value Date/Time  ? NA 137 11/04/2021 0818  ? K 3.8 11/04/2021 0818  ? CL 103 11/04/2021 0818  ? CO2 26 11/04/2021 0818  ? BUN 14 11/04/2021 0818  ? CREATININE 1.03 11/04/2021 0818  ?    ?Component Value Date/Time  ? CALCIUM 8.9 11/04/2021 0818  ? ALKPHOS 66 11/04/2021 0818  ? AST 16 11/04/2021 0818  ? ALT 18 11/04/2021 0818  ? BILITOT 0.4 11/04/2021 0818  ?  ? ? ? 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  ? ?For right now, we will continue him on the Opdivo as a maintenance.  He is doing pretty well with this.  He has good quality of life. ? ?I think the rash might not be from the [facility].  We will have to watch this.   Again, we will try him on some Silvadene cream. ? ?He will be interesting to see what the MRI of the brain has to show. ? ?We will plan to get him back in another 3 weeks. ? ? ?  Thanks ? ?[name], MD ?4/19/20238:14 AM ? ?

## 2021-12-02 NOTE — Patient Instructions (Signed)
[facility]  Discharge Instructions: ?Thank you for choosing [facility] to provide your oncology and hematology care.  ? ?If you have a lab appointment with the [facility], please go directly to the [facility] and check in at the registration area. ? ?Wear comfortable clothing and clothing appropriate for easy access to any Portacath or PICC line.  ? ?We strive to give you quality time with your provider. You may need to reschedule your appointment if you arrive late (15 or more minutes).  Arriving late affects you and other patients whose appointments are after yours.  Also, if you miss three or more appointments without notifying the office, you may be dismissed from the clinic at the provider?s discretion.    ?  ?For prescription refill requests, have your pharmacy contact our office and allow 72 hours for refills to be completed.   ? ?Today you received the following chemotherapy and/or immunotherapy agents Opdivo    ?  ?To help prevent nausea and vomiting after your treatment, we encourage you to take your nausea medication as directed. ? ?BELOW ARE SYMPTOMS THAT SHOULD BE REPORTED IMMEDIATELY: ?*FEVER GREATER THAN 100.4 F (38 ?C) OR HIGHER ?*CHILLS OR SWEATING ?*NAUSEA AND VOMITING THAT IS NOT CONTROLLED WITH YOUR NAUSEA MEDICATION ?*UNUSUAL SHORTNESS OF BREATH ?*UNUSUAL BRUISING OR BLEEDING ?*URINARY PROBLEMS (pain or burning when urinating, or frequent urination) ?*BOWEL PROBLEMS (unusual diarrhea, constipation, pain near the anus) ?TENDERNESS IN MOUTH AND THROAT WITH OR WITHOUT PRESENCE OF ULCERS (sore throat, sores in mouth, or a toothache) ?UNUSUAL RASH, SWELLING OR PAIN  ?UNUSUAL VAGINAL DISCHARGE OR ITCHING  ? ?Items with * indicate a potential emergency and should be followed up as soon as possible or go to the Emergency Department if any problems should occur. ? ?Please show the CHEMOTHERAPY ALERT CARD or IMMUNOTHERAPY ALERT CARD at check-in to the  Emergency Department and triage nurse. ?Should you have questions after your visit or need to cancel or reschedule your appointment, please contact [facility]  [phone] and follow the prompts.  Office hours are 8:00 a.m. to 4:30 p.m. Monday - Friday. Please note that voicemails left after 4:00 p.m. may not be returned until the following business day.  We are closed weekends and major holidays. You have access to a nurse at all times for urgent questions. Please call the main number to the clinic [phone] and follow the prompts. ? ?For any non-urgent questions, you may also contact your provider using MyChart. We now offer e-Visits for anyone 39 and older to request care online for non-urgent symptoms. For details visit [email or web address]. ?  ?Also download the MyChart app! Go to the app store, search "MyChart", open the app, select [facility], and log in with your MyChart username and password. ? ?Due to Covid, a mask is required upon entering the hospital/clinic. If you do not have a mask, one will be given to you upon arrival. For doctor visits, patients may have 1 support person aged 72 or older with them. For treatment visits, patients cannot have anyone with them due to current Covid guidelines and our immunocompromised population.  ?

## 2021-12-17 ENCOUNTER — Ambulatory Visit (HOSPITAL_COMMUNITY)
Admission: RE | Admit: 2021-12-17 | Discharge: 2021-12-17 | Disposition: A | Discharge status: Home / Self Care | Payer: 59 | Source: Ambulatory Visit | Attending: Radiation Oncology | Admitting: Radiation Oncology

## 2021-12-17 DIAGNOSIS — C7931 Secondary malignant neoplasm of brain: Secondary | ICD-10-CM | POA: Insufficient documentation

## 2021-12-17 MED ORDER — GADOBUTROL 1 MMOL/ML IV SOLN
10.0000 mL | Freq: Once | INTRAVENOUS | Status: AC | PRN
  Administered 2021-12-17: 10 mL via INTRAVENOUS

## 2021-12-18 ENCOUNTER — Telehealth: Payer: Self-pay

## 2021-12-18 NOTE — Telephone Encounter (Signed)
Telephone appointment reminder. I left a voicemail reminding patient of his 3:30pm-12/21/21 telephone appointment w/ Shona Simpson PA-C. I left my extension [phone] and requested that patient return my call prior to appointment time, so that I may complete the nursing portion of this appointment. ?

## 2021-12-21 ENCOUNTER — Inpatient Hospital Stay: Payer: 59 | Attending: Internal Medicine

## 2021-12-21 ENCOUNTER — Encounter: Payer: Self-pay | Admitting: Radiation Oncology

## 2021-12-21 ENCOUNTER — Telehealth: Payer: Self-pay

## 2021-12-21 ENCOUNTER — Ambulatory Visit
Admission: RE | Admit: 2021-12-21 | Discharge: 2021-12-21 | Disposition: A | Discharge status: Home / Self Care | Payer: 59 | Source: Ambulatory Visit | Attending: Radiation Oncology | Admitting: Radiation Oncology

## 2021-12-21 DIAGNOSIS — Z5112 Encounter for antineoplastic immunotherapy: Secondary | ICD-10-CM | POA: Insufficient documentation

## 2021-12-21 DIAGNOSIS — C7970 Secondary malignant neoplasm of unspecified adrenal gland: Secondary | ICD-10-CM | POA: Insufficient documentation

## 2021-12-21 DIAGNOSIS — C3411 Malignant neoplasm of upper lobe, right bronchus or lung: Secondary | ICD-10-CM | POA: Insufficient documentation

## 2021-12-21 DIAGNOSIS — C3491 Malignant neoplasm of unspecified part of right bronchus or lung: Secondary | ICD-10-CM

## 2021-12-21 DIAGNOSIS — C7931 Secondary malignant neoplasm of brain: Secondary | ICD-10-CM | POA: Insufficient documentation

## 2021-12-21 NOTE — Telephone Encounter (Signed)
Called all numbers on file and was unable to reach patient for today's 3:30pm telephone appointment w/ Ashlyn Bruning PA-C. Multiple voicemail's have been left for patient in regards to this appointment w/ no response. ?

## 2021-12-21 NOTE — Telephone Encounter (Signed)
Provider name correction. ? ?Called all numbers on file and was unable to reach patient for today's 3:30pm telephone appointment w/ Shona Simpson PA-C. Multiple voicemail's have been left for patient in regards to this appointment w/ no response. ?

## 2021-12-21 NOTE — Progress Notes (Signed)
Telephone appointment. I verified patient identity. Patient is doing well. No issues reported at this time. ? ?Meaningful use complete. ? ?Patient's 3:30-12/21/21 telephone appointment w/ Shona Simpson PA-C is now complete. I left my extension [phone] in case patient needs anything. Patient verbalized understanding of the conversation. ? ?Patient contact [phone] ?

## 2021-12-21 NOTE — Progress Notes (Signed)
?Radiation Oncology         [phone] ?________________________________ ? ?Outpatient Follow Up - Conducted via telephone due to current COVID-19 concerns for limiting patient exposure ? ?I spoke with the patient to conduct this consult visit via telephone to spare the patient unnecessary potential exposure in the healthcare setting during the current COVID-19 pandemic. The patient was notified in advance and was offered a [facility] meeting to allow for face to face communication but unfortunately reported that they did not have the appropriate resources/technology to support such a visit and instead preferred to proceed with a telephone visit. ? ? ? ?Name: Kevin Hunter        MRN: [id number]  ?Date of Service: 12/21/2021 DOB: 18-Jan-1966 ? ?JA:SNKNLZ, Annie Main, MD    ? ?REFERRING PHYSICIAN: Dr. Marin Olp ? ? ?DIAGNOSIS: The encounter diagnosis was Lung cancer, primary, with metastasis from lung to other site, right [facility]). ? ? ?HISTORY OF PRESENT ILLNESS: Kevin Hunter is a 56 y.o. 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  He presented with neurologic symptoms and his lung disease was noted as well during an ED visit. He underwent bronchoscopy on 05/22/2021 and final pathology from that procedure revealed malignant cells consistent with non-small cell carcinoma in the right upper lobe brushings and fine-needle aspirate, both consistent with IHC for squamous cell carcinoma.  Insufficient cellularity in the cellblock was noted for any additional testing however.  He proceeded with stereotactic radiosurgery [facility]) in one fraction on 06/12/21. Since this treatment he received chemo/immunotherapy beginning 06/25/21, and completed the chemotherapy component and remains on Opdivo immunotherapy.  His post [facility] treatment imaging has been stable.  ? ?His most recent MRI on 12/17/2021 showed stable posttreatment changes in the 3 targets that were treated in October  2022.  Decreased edema was noted about the left parietal lesion.  No new disease was present.  He is contacted by phone to review these results. ? ?PREVIOUS RADIATION THERAPY:  ? ?06/12/2021  [facility]RS Treatment ?Site Technique Total Dose (Gy) Dose per Fx (Gy) Completed Fx Beam Energies  ?Brain: Each site was treated in 1 fraction to 20 Gy ?  ?PTV_1_RtFront47mm ?PTV_2_LtPariet42mm  ?PTV_3_LtFront58mm IMRT 20/20 20 1/1 6XFFF  ? ? ? ?PAST MEDICAL HISTORY:  ?Past Medical History:  ?Diagnosis Date  ? Anxiety   ? Bipolar 1 disorder ([facility])   ? Goals of care, counseling/discussion 05/27/2021  ? Hypertension   ? Lung cancer, primary, with metastasis from lung to other site, right ([place]) 05/27/2021  ? Mood disorder ([facility])   ? Sleep apnea   ?   ? ? ?PAST SURGICAL HISTORY: ?Past Surgical History:  ?Procedure Laterality Date  ? BRONCHIAL BIOPSY  05/22/2021  ? Procedure: BRONCHIAL BIOPSIES;  Surgeon: Garner Nash, DO;  Location: [facility];  Service: Pulmonary;;  ? BRONCHIAL BRUSHINGS  05/22/2021  ? Procedure: BRONCHIAL BRUSHINGS;  Surgeon: Garner Nash, DO;  Location: [facility];  Service: Pulmonary;;  ? BRONCHIAL NEEDLE ASPIRATION BIOPSY  05/22/2021  ? Procedure: BRONCHIAL NEEDLE ASPIRATION BIOPSIES;  Surgeon: Garner Nash, DO;  Location: [facility];  Service: Pulmonary;;  ? IR IMAGING GUIDED PORT INSERTION  06/24/2021  ? VIDEO BRONCHOSCOPY WITH ENDOBRONCHIAL NAVIGATION N/A 05/22/2021  ? Procedure: VIDEO BRONCHOSCOPY WITH ENDOBRONCHIAL NAVIGATION;  Surgeon: Garner Nash, DO;  Location: [facility];  Service: Pulmonary;  Laterality: N/A;  ? VIDEO BRONCHOSCOPY WITH RADIAL ENDOBRONCHIAL ULTRASOUND  05/22/2021  ? Procedure: RADIAL ENDOBRONCHIAL  ULTRASOUND;  Surgeon: Garner Nash, DO;  Location: [facility];  Service: Pulmonary;;  ? ? ? ?FAMILY HISTORY:  ?Family History  ?Problem Relation Age of Onset  ? Breast cancer Mother   ? Lung cancer Father   ? ? ? ?SOCIAL HISTORY:  reports that he quit smoking about 3 years ago.  His smoking use included cigarettes and e-cigarettes. He started smoking about 3 years ago. He has never used smokeless tobacco. He reports that he does not drink alcohol and does not use drugs.  The patient is single and lives in [place].  He works for a company that [employer]. 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  ? ? ?ALLERGIES: Paclitaxel and Penicillins ? ? ?MEDICATIONS:  ?Current Outpatient Medications  ?Medication Sig Dispense Refill  ? amLODipine (NORVASC) 5 MG tablet Take 5 mg by mouth at bedtime.    ? clonazePAM (KLONOPIN) 1 MG tablet Take 1 mg by mouth 2 (two) times daily as needed for anxiety (May take additional 0.5mg  as needed for panic attack).    ? ibuprofen (ADVIL) 200 MG tablet Take 800 mg by mouth every 6 (six) hours as needed for headache.    ? pseudoephedrine (SUDAFED) 30 MG tablet Take 60 mg by mouth every 4 (four) hours as needed (headache).    ? psyllium (METAMUCIL) 58.6 % packet Take 1 packet by mouth daily.    ? QUEtiapine (SEROQUEL XR) 400 MG 24 hr tablet Take 400 mg by mouth at bedtime.    ? silver sulfADIAZINE (SILVADENE) 1 % cream Apply 1 application. topically daily. 50 g 0  ? tolterodine (DETROL LA) 4 MG 24 hr capsule Take 4 mg by mouth at bedtime.    ? traZODone (DESYREL) 100 MG tablet Take 200 mg by mouth at bedtime.    ? venlafaxine (EFFEXOR) 75 MG tablet Take 150 mg by mouth at bedtime.    ? ?No current facility-administered medications for this encounter.  ? ? ? ?REVIEW OF SYSTEMS: On review of systems, the patient states that he is doing pretty well.  He does not verbalize chest pain or trouble breathing.  He is not complaining of dizziness that anymore but a few months ago was describing symptoms of dizziness or feeling somewhat faint like when he would move from a seated to a standing position quickly.  He has not been checking his blood pressure like we discussed last time but will attempt to check this  at home.  He reports that in the last few months as well he has been having dizziness and sometimes seeing a black dot in his visual field, this does not lateralize.  It has not limited his visual acuity when he is reading or watching TV.  He does not describe headaches.  He admits its been several years since his last visual check with his optometrist.  No other complaints are verbalized. ? ?PHYSICAL EXAM:  ?Unable to assess given encounter type. ? ? ?ECOG = 1 ? ?0 - Asymptomatic (Fully active, able to carry on all predisease activities without restriction) ? ?1 - Symptomatic but completely ambulatory (Restricted in physically strenuous activity but ambulatory and able to carry out work of a light or sedentary nature. For example, light housework, office work) ? ?2 - Symptomatic, <50% in bed during the day (Ambulatory and capable of all self care but unable to carry out any work activities. Up and about more than 50% of  waking hours) ? ?3 - Symptomatic, >50% in bed, but not bedbound (Capable of only limited self-care, confined to bed or chair 50% or more of waking hours) ? ?4 - Bedbound (Completely disabled. Cannot carry on any self-care. Totally confined to bed or chair) ? ?5 - Death ? ? Oken MM, Creech RH, Tormey DC, et al. [phone]). "Toxicity and response criteria of the [facility] [employer]". [place] Oncol. 5 (6): 649-55 ? ? ? ?LABORATORY DATA:  ?Lab Results  ?Component Value Date  ? WBC 5.0 12/02/2021  ? HGB 12.2 (L) 12/02/2021  ? HCT 35.7 (L) 12/02/2021  ? MCV 102.0 (H) 12/02/2021  ? PLT 111 (L) 12/02/2021  ? ?Lab Results  ?Component Value Date  ? NA 137 12/02/2021  ? K 3.9 12/02/2021  ? CL 104 12/02/2021  ? CO2 26 12/02/2021  ? ?Lab Results  ?Component Value Date  ? ALT 21 12/02/2021  ? AST 17 12/02/2021  ? ALKPHOS 62 12/02/2021  ? BILITOT 0.6 12/02/2021  ? ?  ? ?RADIOGRAPHY: MR Brain W Wo Contrast ? ?Result Date: 12/17/2021 ?CLINICAL DATA:  Brain metastases EXAM: MRI HEAD WITHOUT AND WITH  CONTRAST TECHNIQUE: Multiplanar, multiecho pulse sequences of the brain and surrounding structures were obtained without and with intravenous contrast. CONTRAST:  56mL GADAVIST GADOBUTROL 1 MMOL/ML IV

## 2021-12-23 ENCOUNTER — Encounter: Payer: Self-pay | Admitting: Hematology & Oncology

## 2021-12-23 ENCOUNTER — Inpatient Hospital Stay: Payer: 59

## 2021-12-23 ENCOUNTER — Encounter: Payer: Self-pay | Admitting: *Deleted

## 2021-12-23 ENCOUNTER — Inpatient Hospital Stay (HOSPITAL_BASED_OUTPATIENT_CLINIC_OR_DEPARTMENT_OTHER): Payer: 59 | Admitting: Hematology & Oncology

## 2021-12-23 ENCOUNTER — Other Ambulatory Visit: Payer: Self-pay

## 2021-12-23 VITALS — BP 114/78 | HR 81 | Resp 17

## 2021-12-23 VITALS — BP 116/86 | HR 88 | Temp 98.0°F | Resp 18 | Ht 70.0 in | Wt 289.0 lb

## 2021-12-23 DIAGNOSIS — C3491 Malignant neoplasm of unspecified part of right bronchus or lung: Secondary | ICD-10-CM

## 2021-12-23 DIAGNOSIS — Z5112 Encounter for antineoplastic immunotherapy: Secondary | ICD-10-CM | POA: Diagnosis not present

## 2021-12-23 DIAGNOSIS — C7931 Secondary malignant neoplasm of brain: Secondary | ICD-10-CM | POA: Diagnosis not present

## 2021-12-23 DIAGNOSIS — C3411 Malignant neoplasm of upper lobe, right bronchus or lung: Secondary | ICD-10-CM | POA: Diagnosis present

## 2021-12-23 DIAGNOSIS — C7970 Secondary malignant neoplasm of unspecified adrenal gland: Secondary | ICD-10-CM | POA: Diagnosis not present

## 2021-12-23 LAB — CMP (CANCER CENTER ONLY)
ALT: 21 U/L (ref 0–44)
AST: 18 U/L (ref 15–41)
Albumin: 4.4 g/dL (ref 3.5–5.0)
Alkaline Phosphatase: 70 U/L (ref 38–126)
Anion gap: 8 (ref 5–15)
BUN: 12 mg/dL (ref 6–20)
CO2: 26 mmol/L (ref 22–32)
Calcium: 9.3 mg/dL (ref 8.9–10.3)
Chloride: 103 mmol/L (ref 98–111)
Creatinine: 1.16 mg/dL (ref 0.61–1.24)
GFR, Estimated: >60 mL/min (ref >60)
Glucose, Bld: 96 mg/dL (ref 70–99)
Potassium: 3.7 mmol/L (ref 3.5–5.1)
Sodium: 137 mmol/L (ref 135–145)
Total Bilirubin: 0.6 mg/dL (ref 0.3–1.2)
Total Protein: 7.3 g/dL (ref 6.5–8.1)

## 2021-12-23 LAB — CBC WITH DIFFERENTIAL (CANCER CENTER ONLY)
Abs Immature Granulocytes: 0.02 10*3/uL (ref 0.00–0.07)
Basophils Absolute: 0 10*3/uL (ref 0.0–0.1)
Basophils Relative: 1 %
Eosinophils Absolute: 0.1 10*3/uL (ref 0.0–0.5)
Eosinophils Relative: 2 %
HCT: 37.9 % — ABNORMAL LOW (ref 39.0–52.0)
Hemoglobin: 13 g/dL (ref 13.0–17.0)
Immature Granulocytes: 0 %
Lymphocytes Relative: 24 %
Lymphs Abs: 1.3 10*3/uL (ref 0.7–4.0)
MCH: 34.2 pg — ABNORMAL HIGH (ref 26.0–34.0)
MCHC: 34.3 g/dL (ref 30.0–36.0)
MCV: 99.7 fL (ref 80.0–100.0)
Monocytes Absolute: 0.5 10*3/uL (ref 0.1–1.0)
Monocytes Relative: 9 %
Neutro Abs: 3.5 10*3/uL (ref 1.7–7.7)
Neutrophils Relative %: 64 %
Platelet Count: 133 10*3/uL — ABNORMAL LOW (ref 150–400)
RBC: 3.8 MIL/uL — ABNORMAL LOW (ref 4.22–5.81)
RDW: 13.6 % (ref 11.5–15.5)
WBC Count: 5.4 10*3/uL (ref 4.0–10.5)
nRBC: 0 % (ref 0.0–0.2)

## 2021-12-23 LAB — LACTATE DEHYDROGENASE: LDH: 205 U/L — ABNORMAL HIGH (ref 98–192)

## 2021-12-23 MED ORDER — HEPARIN SOD (PORK) LOCK FLUSH 100 UNIT/ML IV SOLN
500.0000 [IU] | Freq: Once | INTRAVENOUS | Status: AC | PRN
  Administered 2021-12-23: 500 [IU]

## 2021-12-23 MED ORDER — SODIUM CHLORIDE 0.9 % IV SOLN: Freq: Once | INTRAVENOUS | Status: AC

## 2021-12-23 MED ORDER — SODIUM CHLORIDE 0.9 % IV SOLN
360.0000 mg | Freq: Once | INTRAVENOUS | Status: AC
  Administered 2021-12-23: 360 mg via INTRAVENOUS
  Filled 2021-12-23: qty 24

## 2021-12-23 MED ORDER — SODIUM CHLORIDE 0.9% FLUSH
10.0000 mL | INTRAVENOUS | Status: DC | PRN
  Administered 2021-12-23: 10 mL

## 2021-12-23 NOTE — Patient Instructions (Signed)

## 2021-12-23 NOTE — Patient Instructions (Signed)
[facility]  Discharge Instructions: ?Thank you for choosing [facility] to provide your oncology and hematology care.  ? ?If you have a lab appointment with the [facility], please go directly to the [facility] and check in at the registration area. ? ?Wear comfortable clothing and clothing appropriate for easy access to any Portacath or PICC line.  ? ?We strive to give you quality time with your provider. You may need to reschedule your appointment if you arrive late (15 or more minutes).  Arriving late affects you and other patients whose appointments are after yours.  Also, if you miss three or more appointments without notifying the office, you may be dismissed from the clinic at the provider?s discretion.    ?  ?For prescription refill requests, have your pharmacy contact our office and allow 72 hours for refills to be completed.   ? ?Today you received the following chemotherapy and/or immunotherapy agents Opdivo    ?  ?To help prevent nausea and vomiting after your treatment, we encourage you to take your nausea medication as directed. ? ?BELOW ARE SYMPTOMS THAT SHOULD BE REPORTED IMMEDIATELY: ?*FEVER GREATER THAN 100.4 F (38 ?C) OR HIGHER ?*CHILLS OR SWEATING ?*NAUSEA AND VOMITING THAT IS NOT CONTROLLED WITH YOUR NAUSEA MEDICATION ?*UNUSUAL SHORTNESS OF BREATH ?*UNUSUAL BRUISING OR BLEEDING ?*URINARY PROBLEMS (pain or burning when urinating, or frequent urination) ?*BOWEL PROBLEMS (unusual diarrhea, constipation, pain near the anus) ?TENDERNESS IN MOUTH AND THROAT WITH OR WITHOUT PRESENCE OF ULCERS (sore throat, sores in mouth, or a toothache) ?UNUSUAL RASH, SWELLING OR PAIN  ?UNUSUAL VAGINAL DISCHARGE OR ITCHING  ? ?Items with * indicate a potential emergency and should be followed up as soon as possible or go to the Emergency Department if any problems should occur. ? ?Please show the CHEMOTHERAPY ALERT CARD or IMMUNOTHERAPY ALERT CARD at check-in to the  Emergency Department and triage nurse. ?Should you have questions after your visit or need to cancel or reschedule your appointment, please contact [facility]  [phone] and follow the prompts.  Office hours are 8:00 a.m. to 4:30 p.m. Monday - Friday. Please note that voicemails left after 4:00 p.m. may not be returned until the following business day.  We are closed weekends and major holidays. You have access to a nurse at all times for urgent questions. Please call the main number to the clinic [phone] and follow the prompts. ? ?For any non-urgent questions, you may also contact your provider using MyChart. We now offer e-Visits for anyone 14 and older to request care online for non-urgent symptoms. For details visit [email or web address]. ?  ?Also download the MyChart app! Go to the app store, search "MyChart", open the app, select [facility], and log in with your MyChart username and password. ? ?Due to Covid, a mask is required upon entering the hospital/clinic. If you do not have a mask, one will be given to you upon arrival. For doctor visits, patients may have 1 support person aged 38 or older with them. For treatment visits, patients cannot have anyone with them due to current Covid guidelines and our immunocompromised population.  ?

## 2021-12-23 NOTE — Progress Notes (Signed)
?Hematology and Oncology Follow Up Visit ? ?Kevin Hunter ?[id number] ?1965-12-02 56 y.o. ?12/23/2021 ? ? 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  ?Current Therapy:        ?Status post radiosurgery for CNS metastasis -- 06/12/2021 ?Carbo/Taxotere/Opdivo -- s/p cycle #6 -- start on 06/23/2021 ?Opdivo 200 mg IV q 3 weeks -- maintenance - s/p cycle #2 -- start on 11/06/2021 ?  ?Interim History:  Kevin Hunter is here today for follow-up.  He is doing quite nicely.  He would like to go back to work.  I think that he would be okay to go back to work.  He is in good shape.  He is eating well.  His taste food is coming back.  He has had no problems with bowels or bladder. ? ?He did have an MRI of the brain last week.  This did show 3 stable brain lesions that were treated.  There is no growth.  There is no edema. ? ?He has had no cough or shortness of breath.  He has had no nausea or vomiting.  He has had no rashes.  Is been no leg swelling. ? ?Overall, his performance status is ECOG 0.   ? ?Medications:  ?Allergies as of 12/23/2021   ? ?   Reactions  ? Paclitaxel Anaphylaxis  ? Penicillins Other (See Comments)  ? Unknown Reaction when he was an infant.  ? ?  ? ?  ?Medication List  ?  ? ?  ? Accurate as of Dec 23, 2021  8:03 AM. If you have any questions, ask your nurse or doctor.  ?  ?  ? ?  ? ?amLODipine 5 MG tablet ?Commonly known as: NORVASC ?Take 5 mg by mouth at bedtime. ?  ?clonazePAM 1 MG tablet ?Commonly known as: KLONOPIN ?Take 1 mg by mouth 2 (two) times daily as needed for anxiety (May take additional 0.5mg  as needed for panic attack). ?  ?ibuprofen 200 MG tablet ?Commonly known as: ADVIL ?Take 800 mg by mouth every 6 (six) hours as needed for headache. ?  ?pseudoephedrine 30 MG tablet ?Commonly known as: SUDAFED ?Take 60 mg by mouth every 4 (four) hours as needed (headache). ?  ?psyllium 58.6 %  packet ?Commonly known as: METAMUCIL ?Take 1 packet by mouth daily. ?  ?QUEtiapine 400 MG 24 hr tablet ?Commonly known as: SEROQUEL XR ?Take 400 mg by mouth at bedtime. ?  ?silver sulfADIAZINE 1 % cream ?Commonly known as: Silvadene ?Apply 1 application. topically daily. ?  ?tolterodine 4 MG 24 hr capsule ?Commonly known as: DETROL LA ?Take 4 mg by mouth at bedtime. ?  ?traZODone 100 MG tablet ?Commonly known as: DESYREL ?Take 200 mg by mouth at bedtime. ?  ?venlafaxine 75 MG tablet ?Commonly known as: EFFEXOR ?Take 150 mg by mouth at bedtime. ?  ? ?  ? ? ?Allergies:  ?Allergies  ?Allergen Reactions  ? Paclitaxel Anaphylaxis  ? Penicillins Other (See Comments)  ?  Unknown Reaction when he was an infant.  ? ? ?Past Medical History, Surgical history, Social history, and Family History were reviewed and updated. ? ?Review of Systems: ?Review of Systems  ?Constitutional:  Positive for malaise/fatigue.  ?HENT:  Positive for sore throat.   ?Eyes: Negative.   ?Respiratory: Negative.    ?Cardiovascular: Negative.   ?Gastrointestinal:  Positive for nausea.  ?Genitourinary: Negative.   ?Musculoskeletal: Negative.   ?Skin: Negative.   ?  Neurological: Negative.   ?Endo/Heme/Allergies: Negative.   ?Psychiatric/Behavioral: Negative.    ? ? ?Physical Exam: ? height is 5\' 10"  (1.778 m) and weight is 289 lb (131.1 kg). His oral temperature is 98 ?F (36.7 ?C). His blood pressure is 116/86 and his pulse is 88. His respiration is 18 and oxygen saturation is 96%.  ? ?Wt Readings from Last 3 Encounters:  ?12/23/21 289 lb (131.1 kg)  ?12/02/21 291 lb (132 kg)  ?11/04/21 291 lb (132 kg)  ? ?His vital signs are temperature of 97.5.  Pulse 88.  Blood pressure 112/71.  Weight is 291 pounds.   ? ?Physical Exam ?Vitals reviewed.  ?HENT:  ?   Head: Normocephalic and atraumatic.  ?Eyes:  ?   Pupils: Pupils are equal, round, and reactive to light.  ?Cardiovascular:  ?   Rate and Rhythm: Normal rate and regular rhythm.  ?   Heart sounds: Normal  heart sounds.  ?Pulmonary:  ?   Effort: Pulmonary effort is normal.  ?   Breath sounds: Normal breath sounds.  ?Abdominal:  ?   General: Bowel sounds are normal.  ?   Palpations: Abdomen is soft.  ?Musculoskeletal:     ?   General: No tenderness or deformity. Normal range of motion.  ?   Cervical back: Normal range of motion.  ?Lymphadenopathy:  ?   Cervical: No cervical adenopathy.  ?Skin: ?   General: Skin is warm and dry.  ?   Findings: No erythema or rash.  ?Neurological:  ?   Mental Status: He is alert and oriented to person, place, and time.  ?Psychiatric:     ?   Behavior: Behavior normal.     ?   Thought Content: Thought content normal.     ?   Judgment: Judgment normal.  ? ? ? ?Lab Results  ?Component Value Date  ? WBC 5.0 12/02/2021  ? HGB 12.2 (L) 12/02/2021  ? HCT 35.7 (L) 12/02/2021  ? MCV 102.0 (H) 12/02/2021  ? PLT 111 (L) 12/02/2021  ? ?No results found for: FERRITIN, IRON, TIBC, UIBC, IRONPCTSAT ?Lab Results  ?Component Value Date  ? RBC 3.50 (L) 12/02/2021  ? ?No results found for: KPAFRELGTCHN, LAMBDASER, KAPLAMBRATIO ?No results found for: IGGSERUM, IGA, IGMSERUM ?No results found for: TOTALPROTELP, ALBUMINELP, A1GS, A2GS, BETS, BETA2SER, GAMS, MSPIKE, SPEI ?  Chemistry   ?   ?Component Value Date/Time  ? NA 137 12/02/2021 0805  ? K 3.9 12/02/2021 0805  ? CL 104 12/02/2021 0805  ? CO2 26 12/02/2021 0805  ? BUN 15 12/02/2021 0805  ? CREATININE 1.15 12/02/2021 0805  ?    ?Component Value Date/Time  ? CALCIUM 9.0 12/02/2021 0805  ? ALKPHOS 62 12/02/2021 0805  ? AST 17 12/02/2021 0805  ? ALT 21 12/02/2021 0805  ? BILITOT 0.6 12/02/2021 0805  ?  ? ? ? 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  ? ?We have him on maintenance therapy for Opdivo.  He is at 2 cycles to date.  He is done well. ? ?We will probably have to set him up with another PET scan at some point.  Hopefully, we will do this in the  summer. ? ?We will plan to get him back in 3 more weeks.  . ? ? ?[name], MD ?5/10/20238:03 AM ? ?

## 2021-12-23 NOTE — Progress Notes (Signed)
Patient doing well. No need for additional scans at this time.  ? ?Oncology Nurse Navigator Documentation ? ? ?  12/23/2021  ?  8:15 AM  ?Oncology Nurse Navigator Flowsheets  ?Navigator Follow Up Date: 01/13/2022  ?Navigator Follow Up Reason: Follow-up Appointment;Chemotherapy  ?Navigator Location [facility]  ?Navigator Encounter Type Treatment;Appt/Treatment Plan Review  ?Patient Visit Type MedOnc  ?Treatment Phase Active Tx  ?Barriers/Navigation Needs No Barriers At This Time  ?Interventions Psycho-Social Support  ?Acuity Level 1-No Barriers  ?Support Groups/Services Friends and Family  ?Time Spent with Patient 15  ?  ?

## 2022-01-13 ENCOUNTER — Inpatient Hospital Stay: Payer: 59

## 2022-01-13 ENCOUNTER — Inpatient Hospital Stay (HOSPITAL_BASED_OUTPATIENT_CLINIC_OR_DEPARTMENT_OTHER): Payer: 59 | Admitting: Hematology & Oncology

## 2022-01-13 ENCOUNTER — Encounter: Payer: Self-pay | Admitting: Hematology & Oncology

## 2022-01-13 VITALS — BP 109/72 | HR 83 | Temp 97.9°F | Resp 20 | Wt 296.4 lb

## 2022-01-13 DIAGNOSIS — Z5112 Encounter for antineoplastic immunotherapy: Secondary | ICD-10-CM | POA: Diagnosis not present

## 2022-01-13 DIAGNOSIS — C3491 Malignant neoplasm of unspecified part of right bronchus or lung: Secondary | ICD-10-CM

## 2022-01-13 LAB — CBC WITH DIFFERENTIAL (CANCER CENTER ONLY)
Abs Immature Granulocytes: 0.03 10*3/uL (ref 0.00–0.07)
Basophils Absolute: 0 10*3/uL (ref 0.0–0.1)
Basophils Relative: 1 %
Eosinophils Absolute: 0.1 10*3/uL (ref 0.0–0.5)
Eosinophils Relative: 2 %
HCT: 36.6 % — ABNORMAL LOW (ref 39.0–52.0)
Hemoglobin: 12.6 g/dL — ABNORMAL LOW (ref 13.0–17.0)
Immature Granulocytes: 1 %
Lymphocytes Relative: 20 %
Lymphs Abs: 1 10*3/uL (ref 0.7–4.0)
MCH: 34 pg (ref 26.0–34.0)
MCHC: 34.4 g/dL (ref 30.0–36.0)
MCV: 98.7 fL (ref 80.0–100.0)
Monocytes Absolute: 0.5 10*3/uL (ref 0.1–1.0)
Monocytes Relative: 9 %
Neutro Abs: 3.6 10*3/uL (ref 1.7–7.7)
Neutrophils Relative %: 67 %
Platelet Count: 127 10*3/uL — ABNORMAL LOW (ref 150–400)
RBC: 3.71 MIL/uL — ABNORMAL LOW (ref 4.22–5.81)
RDW: 13.5 % (ref 11.5–15.5)
WBC Count: 5.2 10*3/uL (ref 4.0–10.5)
nRBC: 0 % (ref 0.0–0.2)

## 2022-01-13 LAB — CMP (CANCER CENTER ONLY)
ALT: 20 U/L (ref 0–44)
AST: 22 U/L (ref 15–41)
Albumin: 3.8 g/dL (ref 3.5–5.0)
Alkaline Phosphatase: 67 U/L (ref 38–126)
Anion gap: 8 (ref 5–15)
BUN: 16 mg/dL (ref 6–20)
CO2: 24 mmol/L (ref 22–32)
Calcium: 8.5 mg/dL — ABNORMAL LOW (ref 8.9–10.3)
Chloride: 107 mmol/L (ref 98–111)
Creatinine: 1.23 mg/dL (ref 0.61–1.24)
GFR, Estimated: >60 mL/min (ref >60)
Glucose, Bld: 107 mg/dL — ABNORMAL HIGH (ref 70–99)
Potassium: 3.6 mmol/L (ref 3.5–5.1)
Sodium: 139 mmol/L (ref 135–145)
Total Bilirubin: 0.4 mg/dL (ref 0.3–1.2)
Total Protein: 6.8 g/dL (ref 6.5–8.1)

## 2022-01-13 LAB — LACTATE DEHYDROGENASE: LDH: 201 U/L — ABNORMAL HIGH (ref 98–192)

## 2022-01-13 MED ORDER — HEPARIN SOD (PORK) LOCK FLUSH 100 UNIT/ML IV SOLN
500.0000 [IU] | Freq: Once | INTRAVENOUS | Status: AC | PRN
  Administered 2022-01-13: 500 [IU]

## 2022-01-13 MED ORDER — SODIUM CHLORIDE 0.9% FLUSH
10.0000 mL | INTRAVENOUS | Status: DC | PRN
  Administered 2022-01-13: 10 mL

## 2022-01-13 MED ORDER — SODIUM CHLORIDE 0.9 % IV SOLN
360.0000 mg | Freq: Once | INTRAVENOUS | Status: AC
  Administered 2022-01-13: 360 mg via INTRAVENOUS
  Filled 2022-01-13: qty 24

## 2022-01-13 MED ORDER — SODIUM CHLORIDE 0.9 % IV SOLN: Freq: Once | INTRAVENOUS | Status: AC

## 2022-01-13 MED ORDER — SILVER SULFADIAZINE 1 % EX CREA: 1.0000 "application " | TOPICAL_CREAM | Freq: Every day | CUTANEOUS | 2 refills | Status: DC

## 2022-01-13 NOTE — Progress Notes (Signed)
Hematology and Oncology Follow Up Visit  Kevin Hunter [id number] [birth year] 56 y.o. 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  He is back to work.  He is busy.  He is enjoying this.  He says his stamina is still not where he would like it to be.  I am really not surprised by this.  So far, he is done well with the immunotherapy.  He has had no problems with diarrhea.  He has had no cough.  He has had little bit of a rash.  This is localized over on his right side.  He puts some Silvadene cream on this.  He has had no bleeding.  He has had no leg swelling.  He has had no headache.  There is been no visual changes.  He is followed by Radiation Oncology for the CNS metastasis.  He has had no fever.  He has had no mouth sores.  There is been no dysphagia or odynophagia.  Overall, I would say his performance status is probably ECOG 0.    Medications:  Allergies as of 01/13/2022       Reactions   Paclitaxel Anaphylaxis   Penicillins Other (See Comments)   Unknown Reaction when he was an infant.        Medication List        Accurate as of Jan 13, 2022  8:36 AM. If you have any questions, ask your nurse or doctor.          amLODipine 5 MG tablet Commonly known as: NORVASC Take 5 mg by mouth at bedtime.   clonazePAM 1 MG tablet Commonly known as: KLONOPIN Take 1 mg by mouth 2 (two) times daily as needed for anxiety (May take additional 0.5mg  as needed for panic attack).   ibuprofen 200 MG tablet Commonly known as: ADVIL Take 800 mg by mouth every 6 (six) hours as  needed for headache.   pseudoephedrine 30 MG tablet Commonly known as: SUDAFED Take 60 mg by mouth every 4 (four) hours as needed (headache).   psyllium 58.6 % packet Commonly known as: METAMUCIL Take 1 packet by mouth daily.   QUEtiapine 400 MG 24 hr tablet Commonly known as: SEROQUEL XR Take 400 mg by mouth at bedtime.   silver sulfADIAZINE 1 % cream Commonly known as: Silvadene Apply 1 application. topically daily.   tolterodine 4 MG 24 hr capsule Commonly known as: DETROL LA Take 4 mg by mouth at bedtime.   traZODone 100 MG tablet Commonly known as: DESYREL Take 200 mg by mouth at bedtime.   venlafaxine 75 MG tablet Commonly known as: EFFEXOR Take 150 mg by mouth at bedtime.        Allergies:  Allergies  Allergen Reactions   Paclitaxel Anaphylaxis   Penicillins Other (See Comments)    Unknown Reaction when he was an infant.    Past Medical History, Surgical history, Social history, and Family History were reviewed and updated.  Review of Systems: Review of Systems  Constitutional:  Positive for malaise/fatigue.  HENT:  Positive for sore throat.  Eyes: Negative.   Respiratory: Negative.    Cardiovascular: Negative.   Gastrointestinal:  Positive for nausea.  Genitourinary: Negative.   Musculoskeletal: Negative.   Skin: Negative.   Neurological: Negative.   Endo/Heme/Allergies: Negative.   Psychiatric/Behavioral: Negative.      Physical Exam:  weight is 296 lb 6.4 oz (134.4 kg). His oral temperature is 97.9 F (36.6 C). His blood pressure is 109/72 and his pulse is 83. His respiration is 20 and oxygen saturation is 95%.   Wt Readings from Last 3 Encounters:  01/13/22 296 lb 6.4 oz (134.4 kg)  12/23/21 289 lb (131.1 kg)  12/02/21 291 lb (132 kg)   His vital signs are temperature of 97.5.  Pulse 88.  Blood pressure 112/71.  Weight is 291 pounds.    Physical Exam Vitals reviewed.  HENT:     Head: Normocephalic and atraumatic.  Eyes:      Pupils: Pupils are equal, round, and reactive to light.  Cardiovascular:     Rate and Rhythm: Normal rate and regular rhythm.     Heart sounds: Normal heart sounds.  Pulmonary:     Effort: Pulmonary effort is normal.     Breath sounds: Normal breath sounds.  Abdominal:     General: Bowel sounds are normal.     Palpations: Abdomen is soft.  Musculoskeletal:        General: No tenderness or deformity. Normal range of motion.     Cervical back: Normal range of motion.  Lymphadenopathy:     Cervical: No cervical adenopathy.  Skin:    General: Skin is warm and dry.     Findings: No erythema or rash.  Neurological:     Mental Status: He is alert and oriented to person, place, and time.  Psychiatric:        Behavior: Behavior normal.        Thought Content: Thought content normal.        Judgment: Judgment normal.     Lab Results  Component Value Date   WBC 5.2 01/13/2022   HGB 12.6 (L) 01/13/2022   HCT 36.6 (L) 01/13/2022   MCV 98.7 01/13/2022   PLT 127 (L) 01/13/2022   No results found for: FERRITIN, IRON, TIBC, UIBC, IRONPCTSAT Lab Results  Component Value Date   RBC 3.71 (L) 01/13/2022   No results found for: KPAFRELGTCHN, LAMBDASER, KAPLAMBRATIO No results found for: IGGSERUM, IGA, IGMSERUM No results found for: Odetta Pink, SPEI   Chemistry      Component Value Date/Time   NA 137 12/23/2021 0803   K 3.7 12/23/2021 0803   CL 103 12/23/2021 0803   CO2 26 12/23/2021 0803   BUN 12 12/23/2021 0803   CREATININE 1.16 12/23/2021 0803      Component Value Date/Time   CALCIUM 9.3 12/23/2021 0803   ALKPHOS 70 12/23/2021 0803   AST 18 12/23/2021 0803   ALT 21 12/23/2021 0803   BILITOT 0.6 12/23/2021 0803       Impression and Plan: Kevin Hunter is a very pleasant 56 yo caucasian gentleman with 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   We have him on maintenance therapy for Opdivo.  He has  had 3 cycles so far.  We will go ahead and set him up with another PET scan.  His last one was back in March.  I think would be helpful to get a PET scan to see how things are looking.  I am happy that he is able to go back to work.  I know he enjoys this.  He has twins who are graduating high school in a couple weeks.  This also will be a big event for him.    We will plan to get him back in 3 more weeks.  Marland Kitchen   [name], MD 5/31/20238:36 AM

## 2022-01-13 NOTE — Patient Instructions (Signed)
[facility]  Discharge Instructions: Thank you for choosing [facility] to provide your oncology and hematology care.   If you have a lab appointment with the [facility], please go directly to the [facility] and check in at the registration area.  Wear comfortable clothing and clothing appropriate for easy access to any Portacath or PICC line.   We strive to give you quality time with your provider. You may need to reschedule your appointment if you arrive late (15 or more minutes).  Arriving late affects you and other patients whose appointments are after yours.  Also, if you miss three or more appointments without notifying the office, you may be dismissed from the clinic at the provider's discretion.      For prescription refill requests, have your pharmacy contact our office and allow 72 hours for refills to be completed.    Today you received the following chemotherapy and/or immunotherapy agents opdivo       To help prevent nausea and vomiting after your treatment, we encourage you to take your nausea medication as directed.  BELOW ARE SYMPTOMS THAT SHOULD BE REPORTED IMMEDIATELY: *FEVER GREATER THAN 100.4 F (38 C) OR HIGHER *CHILLS OR SWEATING *NAUSEA AND VOMITING THAT IS NOT CONTROLLED WITH YOUR NAUSEA MEDICATION *UNUSUAL SHORTNESS OF BREATH *UNUSUAL BRUISING OR BLEEDING *URINARY PROBLEMS (pain or burning when urinating, or frequent urination) *BOWEL PROBLEMS (unusual diarrhea, constipation, pain near the anus) TENDERNESS IN MOUTH AND THROAT WITH OR WITHOUT PRESENCE OF ULCERS (sore throat, sores in mouth, or a toothache) UNUSUAL RASH, SWELLING OR PAIN  UNUSUAL VAGINAL DISCHARGE OR ITCHING   Items with * indicate a potential emergency and should be followed up as soon as possible or go to the Emergency Department if any problems should occur.  Please show the CHEMOTHERAPY ALERT CARD or IMMUNOTHERAPY ALERT CARD at check-in to the  Emergency Department and triage nurse. Should you have questions after your visit or need to cancel or reschedule your appointment, please contact [facility]  [phone] and follow the prompts.  Office hours are 8:00 a.m. to 4:30 p.m. Monday - Friday. Please note that voicemails left after 4:00 p.m. may not be returned until the following business day.  We are closed weekends and major holidays. You have access to a nurse at all times for urgent questions. Please call the main number to the clinic [phone] and follow the prompts.  For any non-urgent questions, you may also contact your provider using MyChart. We now offer e-Visits for anyone 3 and older to request care online for non-urgent symptoms. For details visit [email or web address].   Also download the MyChart app! Go to the app store, search "MyChart", open the app, select [facility], and log in with your MyChart username and password.  Due to Covid, a mask is required upon entering the hospital/clinic. If you do not have a mask, one will be given to you upon arrival. For doctor visits, patients may have 1 support person aged 67 or older with them. For treatment visits, patients cannot have anyone with them due to current Covid guidelines and our immunocompromised population.

## 2022-01-13 NOTE — Patient Instructions (Signed)

## 2022-01-14 ENCOUNTER — Encounter: Payer: Self-pay | Admitting: *Deleted

## 2022-01-14 NOTE — Progress Notes (Signed)
Patient needs a PET scan prior to his next appointment. Scheduled for 02/01/22.  Called and spoke to patient. He is aware of PET appointment including date, time and location. He is aware of PET prep. Radiology Information Sheet also mailed to patient home with identical information.   Oncology Nurse Navigator Documentation     01/14/2022    1:30 PM  Oncology Nurse Navigator Flowsheets  Navigator Follow Up Date: 02/01/2022  Navigator Follow Up Reason: Scan Review  Navigator Location [facility]  Navigator Encounter Type Appt/Treatment Plan Review;Telephone  Telephone Outgoing Call;Education;Appt Confirmation/Clarification  Patient Visit Type MedOnc  Treatment Phase Active Tx  Barriers/Navigation Needs Coordination of Care;Education  Education Other  Interventions Education;Psycho-Social Support;Coordination of Care  Acuity Level 2-Minimal Needs (1-2 Barriers Identified)  Coordination of Care Radiology  Education Method Verbal;Written  Support Groups/Services Friends and Family  Time Spent with Patient 60

## 2022-02-01 ENCOUNTER — Encounter (HOSPITAL_COMMUNITY)
Admission: RE | Admit: 2022-02-01 | Discharge: 2022-02-01 | Disposition: A | Discharge status: Home / Self Care | Payer: 59 | Source: Ambulatory Visit | Attending: Hematology & Oncology | Admitting: Hematology & Oncology

## 2022-02-01 DIAGNOSIS — C3491 Malignant neoplasm of unspecified part of right bronchus or lung: Secondary | ICD-10-CM | POA: Diagnosis present

## 2022-02-01 LAB — GLUCOSE, CAPILLARY: Glucose-Capillary: 94 mg/dL (ref 70–99)

## 2022-02-01 MED ORDER — FLUDEOXYGLUCOSE F - 18 (FDG) INJECTION
14.8200 | Freq: Once | INTRAVENOUS | Status: AC | PRN
  Administered 2022-02-01: 14.82 via INTRAVENOUS

## 2022-02-02 ENCOUNTER — Encounter: Payer: Self-pay | Admitting: *Deleted

## 2022-02-02 NOTE — Progress Notes (Signed)
Oncology Nurse Navigator Documentation     02/02/2022   11:30 AM  Oncology Nurse Navigator Flowsheets  Navigator Follow Up Date: 02/03/2022  Navigator Follow Up Reason: Follow-up Appointment;Chemotherapy  Navigator Location [facility]  Navigator Encounter Type Scan Review  Patient Visit Type MedOnc  Treatment Phase Active Tx  Barriers/Navigation Needs Coordination of Care;Education  Interventions None Required  Acuity Level 2-Minimal Needs (1-2 Barriers Identified)  Support Groups/Services Friends and Family  Time Spent with Patient 15

## 2022-02-03 ENCOUNTER — Other Ambulatory Visit: Payer: Self-pay | Admitting: Radiation Therapy

## 2022-02-03 ENCOUNTER — Inpatient Hospital Stay: Payer: 59

## 2022-02-03 ENCOUNTER — Encounter: Payer: Self-pay | Admitting: *Deleted

## 2022-02-03 ENCOUNTER — Other Ambulatory Visit: Payer: Self-pay | Admitting: Oncology

## 2022-02-03 ENCOUNTER — Encounter: Payer: Self-pay | Admitting: Hematology & Oncology

## 2022-02-03 ENCOUNTER — Inpatient Hospital Stay: Payer: 59 | Attending: Internal Medicine

## 2022-02-03 ENCOUNTER — Other Ambulatory Visit: Payer: Self-pay

## 2022-02-03 ENCOUNTER — Inpatient Hospital Stay: Payer: 59 | Admitting: Hematology & Oncology

## 2022-02-03 VITALS — BP 113/74 | HR 90 | Temp 97.9°F | Resp 20 | Ht 70.0 in | Wt 293.1 lb

## 2022-02-03 DIAGNOSIS — C3411 Malignant neoplasm of upper lobe, right bronchus or lung: Secondary | ICD-10-CM | POA: Diagnosis present

## 2022-02-03 DIAGNOSIS — Z5112 Encounter for antineoplastic immunotherapy: Secondary | ICD-10-CM | POA: Diagnosis present

## 2022-02-03 DIAGNOSIS — C7931 Secondary malignant neoplasm of brain: Secondary | ICD-10-CM | POA: Diagnosis not present

## 2022-02-03 DIAGNOSIS — C7970 Secondary malignant neoplasm of unspecified adrenal gland: Secondary | ICD-10-CM | POA: Diagnosis not present

## 2022-02-03 DIAGNOSIS — C3491 Malignant neoplasm of unspecified part of right bronchus or lung: Secondary | ICD-10-CM

## 2022-02-03 DIAGNOSIS — F419 Anxiety disorder, unspecified: Secondary | ICD-10-CM

## 2022-02-03 LAB — CMP (CANCER CENTER ONLY)
ALT: 16 U/L (ref 0–44)
AST: 14 U/L — ABNORMAL LOW (ref 15–41)
Albumin: 4.4 g/dL (ref 3.5–5.0)
Alkaline Phosphatase: 68 U/L (ref 38–126)
Anion gap: 11 (ref 5–15)
BUN: 16 mg/dL (ref 6–20)
CO2: 23 mmol/L (ref 22–32)
Calcium: 8.9 mg/dL (ref 8.9–10.3)
Chloride: 103 mmol/L (ref 98–111)
Creatinine: 1.21 mg/dL (ref 0.61–1.24)
GFR, Estimated: >60 mL/min (ref >60)
Glucose, Bld: 167 mg/dL — ABNORMAL HIGH (ref 70–99)
Potassium: 3.4 mmol/L — ABNORMAL LOW (ref 3.5–5.1)
Sodium: 137 mmol/L (ref 135–145)
Total Bilirubin: 0.5 mg/dL (ref 0.3–1.2)
Total Protein: 7.2 g/dL (ref 6.5–8.1)

## 2022-02-03 LAB — CBC WITH DIFFERENTIAL (CANCER CENTER ONLY)
Abs Immature Granulocytes: 0.01 10*3/uL (ref 0.00–0.07)
Basophils Absolute: 0 10*3/uL (ref 0.0–0.1)
Basophils Relative: 0 %
Eosinophils Absolute: 0.1 10*3/uL (ref 0.0–0.5)
Eosinophils Relative: 2 %
HCT: 36.8 % — ABNORMAL LOW (ref 39.0–52.0)
Hemoglobin: 12.7 g/dL — ABNORMAL LOW (ref 13.0–17.0)
Immature Granulocytes: 0 %
Lymphocytes Relative: 25 %
Lymphs Abs: 1.2 10*3/uL (ref 0.7–4.0)
MCH: 33.3 pg (ref 26.0–34.0)
MCHC: 34.5 g/dL (ref 30.0–36.0)
MCV: 96.6 fL (ref 80.0–100.0)
Monocytes Absolute: 0.3 10*3/uL (ref 0.1–1.0)
Monocytes Relative: 6 %
Neutro Abs: 3.2 10*3/uL (ref 1.7–7.7)
Neutrophils Relative %: 67 %
Platelet Count: 127 10*3/uL — ABNORMAL LOW (ref 150–400)
RBC: 3.81 MIL/uL — ABNORMAL LOW (ref 4.22–5.81)
RDW: 13.4 % (ref 11.5–15.5)
WBC Count: 4.8 10*3/uL (ref 4.0–10.5)
nRBC: 0 % (ref 0.0–0.2)

## 2022-02-03 LAB — LACTATE DEHYDROGENASE: LDH: 194 U/L — ABNORMAL HIGH (ref 98–192)

## 2022-02-03 MED ORDER — SODIUM CHLORIDE 0.9% FLUSH
10.0000 mL | INTRAVENOUS | Status: DC | PRN
  Administered 2022-02-03: 10 mL

## 2022-02-03 MED ORDER — SODIUM CHLORIDE 0.9 % IV SOLN: Freq: Once | INTRAVENOUS | Status: AC

## 2022-02-03 MED ORDER — SODIUM CHLORIDE 0.9 % IV SOLN
360.0000 mg | Freq: Once | INTRAVENOUS | Status: AC
  Administered 2022-02-03: 360 mg via INTRAVENOUS
  Filled 2022-02-03: qty 24

## 2022-02-03 MED ORDER — HEPARIN SOD (PORK) LOCK FLUSH 100 UNIT/ML IV SOLN
500.0000 [IU] | Freq: Once | INTRAVENOUS | Status: AC | PRN
  Administered 2022-02-03: 500 [IU]

## 2022-02-03 NOTE — Patient Instructions (Signed)

## 2022-02-03 NOTE — Progress Notes (Signed)
Due to neurological symptoms, patient will need a MRI brain. Scheduled for 02/09/22.  Patient is aware of MRI appointment including date, time and location. Radiology information sheet with same information also reviewed with patient.   Oncology Nurse Navigator Documentation     02/03/2022    8:45 AM  Oncology Nurse Navigator Flowsheets  Navigator Follow Up Date: 02/10/2022  Navigator Follow Up Reason: Scan Review  Navigator Location [facility]  Navigator Encounter Type Treatment;Appt/Treatment Plan Review  Patient Visit Type MedOnc  Treatment Phase Active Tx  Barriers/Navigation Needs Coordination of Care;Education  Education Other  Interventions Coordination of Care;Education  Acuity Level 2-Minimal Needs (1-2 Barriers Identified)  Coordination of Care Radiology  Education Method Verbal;Written  Support Groups/Services Friends and Family  Time Spent with Patient 30

## 2022-02-03 NOTE — Patient Instructions (Signed)
[facility]  Discharge Instructions: Thank you for choosing [facility] to provide your oncology and hematology care.   If you have a lab appointment with the [facility], please go directly to the [facility] and check in at the registration area.  Wear comfortable clothing and clothing appropriate for easy access to any Portacath or PICC line.   We strive to give you quality time with your provider. You may need to reschedule your appointment if you arrive late (15 or more minutes).  Arriving late affects you and other patients whose appointments are after yours.  Also, if you miss three or more appointments without notifying the office, you may be dismissed from the clinic at the provider's discretion.      For prescription refill requests, have your pharmacy contact our office and allow 72 hours for refills to be completed.    Today you received the following chemotherapy and/or immunotherapy agents: Opdivo      To help prevent nausea and vomiting after your treatment, we encourage you to take your nausea medication as directed.  BELOW ARE SYMPTOMS THAT SHOULD BE REPORTED IMMEDIATELY: *FEVER GREATER THAN 100.4 F (38 C) OR HIGHER *CHILLS OR SWEATING *NAUSEA AND VOMITING THAT IS NOT CONTROLLED WITH YOUR NAUSEA MEDICATION *UNUSUAL SHORTNESS OF BREATH *UNUSUAL BRUISING OR BLEEDING *URINARY PROBLEMS (pain or burning when urinating, or frequent urination) *BOWEL PROBLEMS (unusual diarrhea, constipation, pain near the anus) TENDERNESS IN MOUTH AND THROAT WITH OR WITHOUT PRESENCE OF ULCERS (sore throat, sores in mouth, or a toothache) UNUSUAL RASH, SWELLING OR PAIN  UNUSUAL VAGINAL DISCHARGE OR ITCHING   Items with * indicate a potential emergency and should be followed up as soon as possible or go to the Emergency Department if any problems should occur.  Please show the CHEMOTHERAPY ALERT CARD or IMMUNOTHERAPY ALERT CARD at check-in to the  Emergency Department and triage nurse. Should you have questions after your visit or need to cancel or reschedule your appointment, please contact [facility]  [phone] and follow the prompts.  Office hours are 8:00 a.m. to 4:30 p.m. Monday - Friday. Please note that voicemails left after 4:00 p.m. may not be returned until the following business day.  We are closed weekends and major holidays. You have access to a nurse at all times for urgent questions. Please call the main number to the clinic [phone] and follow the prompts.  For any non-urgent questions, you may also contact your provider using MyChart. We now offer e-Visits for anyone 55 and older to request care online for non-urgent symptoms. For details visit [email or web address].   Also download the MyChart app! Go to the app store, search "MyChart", open the app, select [facility], and log in with your MyChart username and password.  Masks are optional in the cancer centers. If you would like for your care team to wear a mask while they are taking care of you, please let them know. For doctor visits, patients may have with them one support person who is at least 55 years old. At this time, visitors are not allowed in the infusion area.

## 2022-02-03 NOTE — Progress Notes (Signed)
Hematology and Oncology Follow Up Visit  Kevin Hunter [id number] [birth year] 56 y.o. 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  He is doing well with the immunotherapy.  His only complaint has been intermittent numbness with the left arm.  It seems to happen when he stretches out his left arm.  There is no weakness.  He says this tends to come and go.  He has had no swelling in the arm.  His last MRI of the brain was back in early May.  I think it would be worthwhile to check this out again.  If the MRI of the brain is stable or better, then we may have to think about doing some kind of evaluation for the brachial plexus.  Otherwise, he is working.  He is having no problems with nausea or vomiting.  There is no fever.  He has had no change in bowel or bladder habits.  He has had no leg swelling.  He has had no leg weakness.  His last TSH back in April was 2.66.  Overall, his appetite is doing well.  He has had no dyspepsia.  There is no cough or shortness of breath.    He did have a PET scan that was done.  This was done earlier this week.  Thankfully, the PET scan did not show any evidence of disease progression systemically.  He still have the right upper lobe mass measuring 2.9 cm.  It had an SUV of 3.44.  Overall, I would say his performance status is probably ECOG 1.    Medications:  Allergies as of 02/03/2022       Reactions   Paclitaxel Anaphylaxis   Penicillins Other (See Comments)   Unknown Reaction when he was an infant.        Medication List        Accurate  as of February 03, 2022  7:59 AM. If you have any questions, ask your nurse or doctor.          amLODipine 5 MG tablet Commonly known as: NORVASC Take 5 mg by mouth at bedtime.   clonazePAM 1 MG tablet Commonly known as: KLONOPIN Take 1 mg by mouth 2 (two) times daily as needed for anxiety (May take additional 0.5mg  as needed for panic attack).   ibuprofen 200 MG tablet Commonly known as: ADVIL Take 800 mg by mouth every 6 (six) hours as needed for headache.   pseudoephedrine 30 MG tablet Commonly known as: SUDAFED Take 60 mg by mouth every 4 (four) hours as needed (headache).   psyllium 58.6 % packet Commonly known as: METAMUCIL Take 1 packet by mouth daily.   QUEtiapine 400 MG 24 hr tablet Commonly known as: SEROQUEL XR Take 400 mg by mouth at bedtime.   silver sulfADIAZINE 1 % cream Commonly known as: Silvadene Apply 1 application. topically daily.   tolterodine 4 MG 24 hr capsule Commonly known as: DETROL LA Take 4 mg by mouth at bedtime.   traZODone 100 MG tablet Commonly known as: DESYREL Take 200 mg by mouth at bedtime.  venlafaxine 75 MG tablet Commonly known as: EFFEXOR Take 150 mg by mouth at bedtime.        Allergies:  Allergies  Allergen Reactions   Paclitaxel Anaphylaxis   Penicillins Other (See Comments)    Unknown Reaction when he was an infant.    Past Medical History, Surgical history, Social history, and Family History were reviewed and updated.  Review of Systems: Review of Systems  Constitutional:  Positive for malaise/fatigue.  HENT:  Positive for sore throat.   Eyes: Negative.   Respiratory: Negative.    Cardiovascular: Negative.   Gastrointestinal:  Positive for nausea.  Genitourinary: Negative.   Musculoskeletal: Negative.   Skin: Negative.   Neurological: Negative.   Endo/Heme/Allergies: Negative.   Psychiatric/Behavioral: Negative.       Physical Exam:  vitals were not taken for this visit.   Wt Readings from Last  3 Encounters:  01/13/22 296 lb 6.4 oz (134.4 kg)  12/23/21 289 lb (131.1 kg)  12/02/21 291 lb (132 kg)   His vital signs are temperature of 97.5.  Pulse 88.  Blood pressure 112/71.  Weight is 291 pounds.    Physical Exam Vitals reviewed.  HENT:     Head: Normocephalic and atraumatic.  Eyes:     Pupils: Pupils are equal, round, and reactive to light.  Cardiovascular:     Rate and Rhythm: Normal rate and regular rhythm.     Heart sounds: Normal heart sounds.  Pulmonary:     Effort: Pulmonary effort is normal.     Breath sounds: Normal breath sounds.  Abdominal:     General: Bowel sounds are normal.     Palpations: Abdomen is soft.  Musculoskeletal:        General: No tenderness or deformity. Normal range of motion.     Cervical back: Normal range of motion.  Lymphadenopathy:     Cervical: No cervical adenopathy.  Skin:    General: Skin is warm and dry.     Findings: No erythema or rash.  Neurological:     Mental Status: He is alert and oriented to person, place, and time.  Psychiatric:        Behavior: Behavior normal.        Thought Content: Thought content normal.        Judgment: Judgment normal.      Lab Results  Component Value Date   WBC 5.2 01/13/2022   HGB 12.6 (L) 01/13/2022   HCT 36.6 (L) 01/13/2022   MCV 98.7 01/13/2022   PLT 127 (L) 01/13/2022   No results found for: "FERRITIN", "IRON", "TIBC", "UIBC", "IRONPCTSAT" Lab Results  Component Value Date   RBC 3.71 (L) 01/13/2022   No results found for: "KPAFRELGTCHN", "LAMBDASER", "KAPLAMBRATIO" No results found for: "IGGSERUM", "IGA", "IGMSERUM" No results found for: "TOTALPROTELP", "ALBUMINELP", "A1GS", "A2GS", "BETS", "BETA2SER", "GAMS", "MSPIKE", "SPEI"   Chemistry      Component Value Date/Time   NA 139 01/13/2022 0820   K 3.6 01/13/2022 0820   CL 107 01/13/2022 0820   CO2 24 01/13/2022 0820   BUN 16 01/13/2022 0820   CREATININE 1.23 01/13/2022 0820      Component Value Date/Time   CALCIUM  8.5 (L) 01/13/2022 0820   ALKPHOS 67 01/13/2022 0820   AST 22 01/13/2022 0820   ALT 20 01/13/2022 0820   BILITOT 0.4 01/13/2022 0820       Impression and Plan: Kevin Hunter is a very pleasant 56 yo caucasian gentleman with 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   We have him on maintenance therapy for Opdivo.  He has had 4 cycles so far.  I think that as long as everything is stable, we will continue him on the [facility].  I see no complication from the [facility] which is wonderful.  We will plan to get him back in another 3 weeks.  We will get an MRI of the brain just to make sure everything is going okay up in the brain.    [name], MD 6/21/20237:59 AM

## 2022-02-09 ENCOUNTER — Encounter (HOSPITAL_COMMUNITY): Payer: Self-pay | Admitting: Radiology

## 2022-02-09 ENCOUNTER — Ambulatory Visit (HOSPITAL_COMMUNITY)
Admission: RE | Admit: 2022-02-09 | Discharge: 2022-02-09 | Disposition: A | Discharge status: Home / Self Care | Payer: 59 | Source: Ambulatory Visit | Attending: Hematology & Oncology | Admitting: Hematology & Oncology

## 2022-02-09 DIAGNOSIS — C3491 Malignant neoplasm of unspecified part of right bronchus or lung: Secondary | ICD-10-CM | POA: Diagnosis present

## 2022-02-09 MED ORDER — GADOBUTROL 1 MMOL/ML IV SOLN
8.0000 mL | Freq: Once | INTRAVENOUS | Status: AC | PRN
  Administered 2022-02-09: 8 mL via INTRAVENOUS

## 2022-02-10 ENCOUNTER — Encounter: Payer: Self-pay | Admitting: *Deleted

## 2022-02-10 NOTE — Progress Notes (Signed)
Oncology Nurse Navigator Documentation     02/10/2022   11:45 AM  Oncology Nurse Navigator Flowsheets  Navigator Follow Up Date: 02/24/2022  Navigator Follow Up Reason: Follow-up Appointment;Chemotherapy  Navigator Location [facility]  Navigator Encounter Type Scan Review  Patient Visit Type MedOnc  Treatment Phase Active Tx  Barriers/Navigation Needs Coordination of Care;Education  Interventions None Required  Acuity Level 2-Minimal Needs (1-2 Barriers Identified)  Support Groups/Services Friends and Family  Time Spent with Patient 15

## 2022-02-11 ENCOUNTER — Other Ambulatory Visit: Payer: Self-pay

## 2022-02-11 ENCOUNTER — Telehealth: Payer: Self-pay | Admitting: *Deleted

## 2022-02-11 ENCOUNTER — Other Ambulatory Visit: Payer: Self-pay | Admitting: Radiation Oncology

## 2022-02-11 DIAGNOSIS — C7931 Secondary malignant neoplasm of brain: Secondary | ICD-10-CM

## 2022-02-11 MED ORDER — DEXAMETHASONE 4 MG PO TABS: 4.0000 mg | ORAL_TABLET | Freq: Every day | ORAL | 1 refills | Status: DC

## 2022-02-11 NOTE — Telephone Encounter (Signed)
Per Shona Simpson, PA patients coordination is off along with left leg weakness and headaches.  Bryson Ha prescribed Decadron 4 mg tablets (two today then once daily).  She wanted patient to see Dr Mickeal Skinner for steroid taper.  Referral was placed and patient was scheduled.

## 2022-02-15 ENCOUNTER — Other Ambulatory Visit: Payer: Self-pay

## 2022-02-15 ENCOUNTER — Inpatient Hospital Stay: Payer: 59 | Attending: Internal Medicine | Admitting: Internal Medicine

## 2022-02-15 DIAGNOSIS — Z79899 Other long term (current) drug therapy: Secondary | ICD-10-CM | POA: Diagnosis not present

## 2022-02-15 DIAGNOSIS — C3411 Malignant neoplasm of upper lobe, right bronchus or lung: Secondary | ICD-10-CM | POA: Diagnosis present

## 2022-02-15 DIAGNOSIS — C7931 Secondary malignant neoplasm of brain: Secondary | ICD-10-CM

## 2022-02-15 DIAGNOSIS — C3490 Malignant neoplasm of unspecified part of unspecified bronchus or lung: Secondary | ICD-10-CM | POA: Diagnosis not present

## 2022-02-15 DIAGNOSIS — C3491 Malignant neoplasm of unspecified part of right bronchus or lung: Secondary | ICD-10-CM

## 2022-02-15 DIAGNOSIS — C7970 Secondary malignant neoplasm of unspecified adrenal gland: Secondary | ICD-10-CM | POA: Insufficient documentation

## 2022-02-15 DIAGNOSIS — Z5112 Encounter for antineoplastic immunotherapy: Secondary | ICD-10-CM | POA: Insufficient documentation

## 2022-02-15 NOTE — Progress Notes (Signed)
[facility] [place], [place] [address] [phone]   New Patient Evaluation  Date of Service: 02/15/22 Patient Name: Kevin Hunter Patient MRN: [id number] Patient DOB: 05/14/1966 Provider: Ventura Sellers, MD  Identifying Statement:  Kevin Hunter is a 56 y.o. male with Non-small cell lung cancer metastatic to brain [facility]) who presents for initial consultation and evaluation regarding cancer associated neurologic deficits.    Referring Provider: Orpah Melter, MD [address] [place],  [place] [address]  Primary Cancer:  Oncologic History: Oncology History  Lung cancer, primary, with metastasis from lung to other site, right ([place])  05/27/2021 Initial Diagnosis   Lung cancer, primary, with metastasis from lung to other site, right [facility])   05/27/2021 Cancer Staging   Staging form: Lung, AJCC 8th Edition - Clinical stage from 05/27/2021: Stage IV (cT3, cN2, pM1) - Signed by [name], MD on 05/27/2021 Histologic grade (G): G2 Histologic grading system: 4 grade system   06/25/2021 - 07/14/2021 Chemotherapy   Patient is on Treatment Plan : LUNG NSCLC SQUAMOUS Nivolumab + Ipilimumab + Carboplatin + Paclitaxel q42d X 1 cycle / Nivolumab + Ipilimumab q42d     07/22/2021 - 10/16/2021 Chemotherapy   Patient is on Treatment Plan : LUNG Carboplatin / Docetaxel q21d     07/22/2021 -  Chemotherapy   Patient is on Treatment Plan : LUNG Nivolumab q21d      CNS Oncologic History 06/12/21: SRS x3 [facility])  History of Present Illness: The patient's records from the referring physician were obtained and reviewed and the patient interviewed to confirm this HPI.  Kevin Hunter presents today for follow up after recent neurologic symptoms.  He describes numbness and weakness of his left arm and leg, progressive over several weeks.  He began dragging the left leg, interfering with ambulation.  Also describes issues with  cognition, short term memory.  Morning headaches and blurry vision have accompanied these changes.  Decadron was started late last week, this has led to significantly improved symptom burden.  He feels close to his baseline at this time.  Continues on opdivo with Dr. Marin Olp.    Medications: Current Outpatient Medications on File Prior to Visit  Medication Sig Dispense Refill   amLODipine (NORVASC) 5 MG tablet Take 5 mg by mouth at bedtime.     clonazePAM (KLONOPIN) 1 MG tablet Take 1 mg by mouth 2 (two) times daily as needed for anxiety (May take additional 0.5mg  as needed for panic attack).     dexamethasone (DECADRON) 4 MG tablet Take 1 tablet (4 mg total) by mouth daily. 30 tablet 1   ibuprofen (ADVIL) 200 MG tablet Take 800 mg by mouth every 6 (six) hours as needed for headache.     pseudoephedrine (SUDAFED) 30 MG tablet Take 60 mg by mouth every 4 (four) hours as needed (headache).     psyllium (METAMUCIL) 58.6 % packet Take 1 packet by mouth daily.     QUEtiapine (SEROQUEL XR) 400 MG 24 hr tablet Take 400 mg by mouth at bedtime.     Kevin sulfADIAZINE (SILVADENE) 1 % cream Apply 1 application. topically daily. 50 g 2   tolterodine (DETROL LA) 4 MG 24 hr capsule Take 4 mg by mouth at bedtime.     traZODone (DESYREL) 100 MG tablet Take 200 mg by mouth at bedtime.     venlafaxine (EFFEXOR) 75 MG tablet Take 150 mg by mouth at  bedtime.     [DISCONTINUED] prochlorperazine (COMPAZINE) 10 MG tablet Take 1 tablet (10 mg total) by mouth every 6 (six) hours as needed (Nausea or vomiting). 30 tablet 1   No current facility-administered medications on file prior to visit.    Allergies:  Allergies  Allergen Reactions   Paclitaxel Anaphylaxis   Penicillins Other (See Comments)    Unknown Reaction when he was an infant.   Past Medical History:  Past Medical History:  Diagnosis Date   Anxiety    Bipolar 1 disorder ([place])    Goals of care, counseling/discussion 05/27/2021   Hypertension     Lung cancer, primary, with metastasis from lung to other site, right ([place]) 05/27/2021   Mood disorder ([place])    Sleep apnea    Past Surgical History:  Past Surgical History:  Procedure Laterality Date   BRONCHIAL BIOPSY  05/22/2021   Procedure: BRONCHIAL BIOPSIES;  Surgeon: Garner Nash, DO;  Location: [facility];  Service: Pulmonary;;   BRONCHIAL BRUSHINGS  05/22/2021   Procedure: BRONCHIAL BRUSHINGS;  Surgeon: Garner Nash, DO;  Location: [facility];  Service: Pulmonary;;   BRONCHIAL NEEDLE ASPIRATION BIOPSY  05/22/2021   Procedure: BRONCHIAL NEEDLE ASPIRATION BIOPSIES;  Surgeon: Garner Nash, DO;  Location: [facility];  Service: Pulmonary;;   IR IMAGING GUIDED PORT INSERTION  06/24/2021   VIDEO BRONCHOSCOPY WITH ENDOBRONCHIAL NAVIGATION N/A 05/22/2021   Procedure: VIDEO BRONCHOSCOPY WITH ENDOBRONCHIAL NAVIGATION;  Surgeon: Garner Nash, DO;  Location: [facility];  Service: Pulmonary;  Laterality: N/A;   VIDEO BRONCHOSCOPY WITH RADIAL ENDOBRONCHIAL ULTRASOUND  05/22/2021   Procedure: RADIAL ENDOBRONCHIAL ULTRASOUND;  Surgeon: Garner Nash, DO;  Location: [facility];  Service: Pulmonary;;   Social History:  Social History   Socioeconomic History   Marital status: Single    Spouse name: Not on file   Number of children: Not on file   Years of education: Not on file   Highest education level: Not on file  Occupational History   Not on file  Tobacco Use   Smoking status: Former    Types: Cigarettes, E-cigarettes    Start date: 05/16/2018    Quit date: 05/16/2018    Years since quitting: 3.7   Smokeless tobacco: Never  Vaping Use   Vaping Use: Never used  Substance and Sexual Activity   Alcohol use: Never   Drug use: Never   Sexual activity: Not on file  Other Topics Concern   Not on file  Social History Narrative   Not on file   Social Determinants of Health   Financial Resource Strain: Not on file  Food Insecurity: Not on file  Transportation  Needs: Not on file  Physical Activity: Not on file  Stress: Not on file  Social Connections: Not on file  Intimate Partner Violence: Not At Risk (05/28/2021)   Humiliation, Afraid, Rape, and Kick questionnaire    Fear of Current or Ex-Partner: No    Emotionally Abused: No    Physically Abused: No    Sexually Abused: No   Family History:  Family History  Problem Relation Age of Onset   Breast cancer Mother    Lung cancer Father     Review of Systems: Constitutional: Doesn't report fevers, chills or abnormal weight loss Eyes: Doesn't report blurriness of vision Ears, nose, mouth, throat, and face: Doesn't report sore throat Respiratory: Doesn't report cough, dyspnea or wheezes Cardiovascular: Doesn't report palpitation, chest discomfort  Gastrointestinal:  Doesn't report nausea, constipation, diarrhea GU:  Doesn't report incontinence Skin: Doesn't report skin rashes Neurological: Per HPI Musculoskeletal: Doesn't report joint pain Behavioral/Psych: Doesn't report anxiety  Physical Exam: Vitals:   02/15/22 0949  BP: 132/84  Pulse: 70  Resp: 18  Temp: 97.9 F (36.6 C)  SpO2: 95%   KPS: 80. General: Alert, cooperative, pleasant, in no acute distress Head: Normal EENT: No conjunctival injection or scleral icterus.  Lungs: Resp effort normal Cardiac: Regular rate Abdomen: Non-distended abdomen Skin: No rashes cyanosis or petechiae. Extremities: No clubbing or edema  Neurologic Exam: Mental Status: Awake, alert, attentive to examiner. Oriented to self and environment. Language is fluent with intact comprehension.  Cranial Nerves: Visual acuity is grossly normal. Visual fields are full. Extra-ocular movements intact. No ptosis. Face is symmetric Motor: Tone and bulk are normal. Power is 4+/5 in left arm and leg with noted drift. Reflexes are symmetric, no pathologic reflexes present.  Sensory: Intact to light touch Gait: Independent   Labs: I have reviewed the data as  listed    Component Value Date/Time   NA 137 02/03/2022 0755   K 3.4 (L) 02/03/2022 0755   CL 103 02/03/2022 0755   CO2 23 02/03/2022 0755   GLUCOSE 167 (H) 02/03/2022 0755   BUN 16 02/03/2022 0755   CREATININE 1.21 02/03/2022 0755   CALCIUM 8.9 02/03/2022 0755   PROT 7.2 02/03/2022 0755   ALBUMIN 4.4 02/03/2022 0755   AST 14 (L) 02/03/2022 0755   ALT 16 02/03/2022 0755   ALKPHOS 68 02/03/2022 0755   BILITOT 0.5 02/03/2022 0755   GFRNONAA >60 02/03/2022 0755   Lab Results  Component Value Date   WBC 4.8 02/03/2022   NEUTROABS 3.2 02/03/2022   HGB 12.7 (L) 02/03/2022   HCT 36.8 (L) 02/03/2022   MCV 96.6 02/03/2022   PLT 127 (L) 02/03/2022    Imaging:  MR Brain W Wo Contrast  Result Date: 02/10/2022 CLINICAL DATA:  56 year old male with non-small cell lung cancer. Three treated brain metastases. Restaging. EXAM: MRI HEAD WITHOUT AND WITH CONTRAST TECHNIQUE: Multiplanar, multiecho pulse sequences of the brain and surrounding structures were obtained without and with intravenous contrast. CONTRAST:  96mL GADAVIST GADOBUTROL 1 MMOL/ML IV SOLN COMPARISON:  Brain MRI 12/17/2021 and earlier. FINDINGS: Brain: T2 and FLAIR hyperintense vasogenic edema surrounding the right superior frontal metastasis has progressed since May, and the underlying irregular mass with indistinct enhancing margins now is 27 x 24 x 28 mm (AP by transverse by CC), versus up to 20 mm long axis last month. Hemosiderin within the lesion appears unchanged. Mild regional mass effect. Vasogenic edema also appears mildly progressed in the left parietal lobe on series 13, image 32, with underlying heterogeneously enhancing 22 by 17 x 20 mm metastasis there (up to 19 mm long axis last month). Hemosiderin appears unchanged. Mild regional mass effect. And there is mild but largely new vasogenic edema at the anterior left frontal lobe lesion which is enhances and now measures 12 x 17 x 17 mm (AP by transverse by CC) versus up to  10 mm long axis last month. No new enhancing brain metastasis identified. No dural thickening identified. Mild mass effect now on the right lateral ventricle but no ventriculomegaly or midline shift. Basilar cisterns remain normal. No superimposed restricted diffusion suggestive of acute infarction. No extra-axial collection or acute intracranial hemorrhage. Cervicomedullary junction and pituitary are within normal limits. Stable gray and white matter signal elsewhere. Vascular: Major intracranial vascular flow voids are stable, dominant left vertebral artery. The  major dural venous sinuses are enhancing and appear to be patent. Skull and upper cervical spine: Visualized bone marrow signal is within normal limits. Negative visible cervical spinal cord. Sinuses/Orbits: Mildly Disconjugate gaze is stable. Paranasal Visualized paranasal sinuses and mastoids are stable and well aerated. Other: Visible internal auditory structures appear normal. Negative visible scalp and face. IMPRESSION: 1. All 3 treated brain metastases have enlarged since last month (now ranging from 17 to 28 mm long axis) and demonstrate increased surrounding edema. 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 There is mild associated mass effect on the right lateral ventricle. No midline shift. 2. No new metastatic disease identified. Electronically Signed   By: Genevie Ann M.D.   On: 02/10/2022 11:30   NM PET Image Restag (PS) Skull Base To Thigh  Result Date: 02/02/2022 CLINICAL DATA:  Subsequent treatment strategy for non-small cell lung cancer. EXAM: NUCLEAR MEDICINE PET SKULL BASE TO THIGH TECHNIQUE: 14.8 mCi F-18 FDG was injected intravenously. Full-ring PET imaging was performed from the skull base to thigh after the radiotracer. CT data was obtained and used for attenuation correction and anatomic localization. Fasting blood glucose: 94 mg/dl COMPARISON:  11/03/2021 FINDINGS: Mediastinal blood pool activity: SUV max 3.57 Liver activity:  SUV max NA NECK: No hypermetabolic lymph nodes in the neck. Incidental CT findings: none CHEST: Right upper lobe lung mass 2.9 cm and has an SUV max of 3.44, image 16/7. On the previous exam this measured 3 cm with an SUV max of 3.8. The adjacent nodule measures 1.7 cm within SUV max of 2.72. Formally 2.1 cm with SUV max 3.83. No new tracer avid pulmonary nodules identified. There are no tracer avid mediastinal or hilar lymph nodes. Small tracer avid axillary lymph nodes are again noted, including: Left axillary lymph node measures 1 cm with SUV max 2.15, image 61/4. Previously 1.1 cm with SUV max 2.9. Incidental CT findings: Aortic atherosclerosis. Coronary artery calcifications. ABDOMEN/PELVIS: No abnormal tracer uptake identified within the liver, pancreas, spleen, or adrenal glands. The spleen is enlarged measuring 16.3 cm in cranial caudal dimension, unchanged from the previous exam. No focal areas of hypermetabolism identified within the spleen. No tracer avid abdominopelvic lymph nodes. Incidental CT findings: Aortic atherosclerotic calcifications. SKELETON: No focal hypermetabolic activity to suggest skeletal metastasis. Similar appearance of mildly tracer avid skin thickening along the right ventral chest wall with SUV max 2.23, image 52/4. On the previous exam this measured 6.2. Previous area of focal hypermetabolism and skin thickening within the ventral pelvic wall has an SUV max of 2.37 on today's study, image 193/4. Previously SUV max was equal to 9.37. Incidental CT findings: none IMPRESSION: 1. No significant change in size or degree of FDG uptake associated with the dominant right upper lobe lung lesion. The smaller, adjacent lesion is mildly decreased in size and degree of FDG uptake. 2. Stable appearance of small axillary lymph nodes which exhibit mild nonspecific tracer uptake. 3. Interval decrease in tracer uptake associated with focal areas of skin thickening within the ventral right chest wall  and midline ventral pelvic wall. This is a nonspecific finding but may reflect sequelae of underlying cutaneous inflammation or infection. Electronically Signed   By: Kerby Moors M.D.   On: 02/02/2022 11:01    [place] Clinician Interpretation: I have personally reviewed the radiological images as listed.  My interpretation, in the context of the patient's clinical presentation, is treatment effect vs true progression   Assessment/Plan Non-small cell lung cancer metastatic to brain [facility])  Kevin Reichmann  D Hunter presents with clinical and radiographic syndrome consistent with multifocal CNS metastasis 2/2 non small cell lung cancer.  His clinical syndrome localizes to the right frontal lobe.  Brain MRI demonstrates progression of disease and significant edema surrounding right frontal and left parietal lesions.  Etiology is suspected radio-inflammatory process based on rapid growth, timing since radiation, and good response to steroids.    We recommended careful surveillance at this time, with repeat MRI brain in 2 months.    Starting at 4mg  daily, decadron should be decreased by 1mg  each week until discontinued, if tolerated.  We spent twenty additional minutes teaching regarding the natural history, biology, and historical experience in the treatment of neurologic complications of cancer.   We appreciate the opportunity to participate in the care of Kevin Hunter.   We ask that Kevin Hunter return to clinic in 2 months following next brain MRI, or sooner as needed.  All questions were answered. The patient knows to call the clinic with any problems, questions or concerns. No barriers to learning were detected.  The total time spent in the encounter was 40 minutes and more than 50% was on counseling and review of test results   Ventura Sellers, MD Medical Director of Neuro-Oncology [facility] at [place] 02/15/22 12:10 PM

## 2022-02-17 ENCOUNTER — Telehealth: Payer: Self-pay | Admitting: Internal Medicine

## 2022-02-17 NOTE — Telephone Encounter (Signed)
Per 7/3 los called and spoke to pt  pt confirmed appointment

## 2022-02-18 ENCOUNTER — Other Ambulatory Visit: Payer: Self-pay | Admitting: Radiation Therapy

## 2022-02-22 ENCOUNTER — Telehealth: Payer: Self-pay | Admitting: Radiation Oncology

## 2022-02-22 ENCOUNTER — Inpatient Hospital Stay: Payer: 59

## 2022-02-22 NOTE — Telephone Encounter (Signed)
Error see prior documentation but pt was started on steroids due to symptoms in conversations with medical oncology, neuro oncology, and he will be seen and followed by [name].

## 2022-02-24 ENCOUNTER — Other Ambulatory Visit: Payer: Self-pay | Admitting: Internal Medicine

## 2022-02-24 ENCOUNTER — Encounter: Payer: Self-pay | Admitting: Family

## 2022-02-24 ENCOUNTER — Inpatient Hospital Stay: Payer: 59

## 2022-02-24 ENCOUNTER — Encounter: Payer: Self-pay | Admitting: *Deleted

## 2022-02-24 ENCOUNTER — Inpatient Hospital Stay (HOSPITAL_BASED_OUTPATIENT_CLINIC_OR_DEPARTMENT_OTHER): Payer: 59 | Admitting: Family

## 2022-02-24 VITALS — BP 121/77 | HR 63 | Resp 17

## 2022-02-24 VITALS — BP 104/73 | HR 66 | Temp 97.6°F | Resp 17 | Wt 291.0 lb

## 2022-02-24 DIAGNOSIS — Z5112 Encounter for antineoplastic immunotherapy: Secondary | ICD-10-CM | POA: Diagnosis not present

## 2022-02-24 DIAGNOSIS — E032 Hypothyroidism due to medicaments and other exogenous substances: Secondary | ICD-10-CM | POA: Diagnosis not present

## 2022-02-24 DIAGNOSIS — C3490 Malignant neoplasm of unspecified part of unspecified bronchus or lung: Secondary | ICD-10-CM

## 2022-02-24 DIAGNOSIS — C3491 Malignant neoplasm of unspecified part of right bronchus or lung: Secondary | ICD-10-CM

## 2022-02-24 DIAGNOSIS — C7931 Secondary malignant neoplasm of brain: Secondary | ICD-10-CM

## 2022-02-24 DIAGNOSIS — F419 Anxiety disorder, unspecified: Secondary | ICD-10-CM

## 2022-02-24 LAB — CBC WITH DIFFERENTIAL (CANCER CENTER ONLY)
Abs Immature Granulocytes: 0.05 10*3/uL (ref 0.00–0.07)
Basophils Absolute: 0 10*3/uL (ref 0.0–0.1)
Basophils Relative: 0 %
Eosinophils Absolute: 0 10*3/uL (ref 0.0–0.5)
Eosinophils Relative: 0 %
HCT: 42.2 % (ref 39.0–52.0)
Hemoglobin: 14.3 g/dL (ref 13.0–17.0)
Immature Granulocytes: 1 %
Lymphocytes Relative: 12 %
Lymphs Abs: 1 10*3/uL (ref 0.7–4.0)
MCH: 32.9 pg (ref 26.0–34.0)
MCHC: 33.9 g/dL (ref 30.0–36.0)
MCV: 97 fL (ref 80.0–100.0)
Monocytes Absolute: 0.5 10*3/uL (ref 0.1–1.0)
Monocytes Relative: 6 %
Neutro Abs: 7.4 10*3/uL (ref 1.7–7.7)
Neutrophils Relative %: 81 %
Platelet Count: 146 10*3/uL — ABNORMAL LOW (ref 150–400)
RBC: 4.35 MIL/uL (ref 4.22–5.81)
RDW: 14.2 % (ref 11.5–15.5)
WBC Count: 9 10*3/uL (ref 4.0–10.5)
nRBC: 0 % (ref 0.0–0.2)

## 2022-02-24 LAB — CMP (CANCER CENTER ONLY)
ALT: 20 U/L (ref 0–44)
AST: 12 U/L — ABNORMAL LOW (ref 15–41)
Albumin: 4.3 g/dL (ref 3.5–5.0)
Alkaline Phosphatase: 56 U/L (ref 38–126)
Anion gap: 9 (ref 5–15)
BUN: 17 mg/dL (ref 6–20)
CO2: 27 mmol/L (ref 22–32)
Calcium: 9.3 mg/dL (ref 8.9–10.3)
Chloride: 102 mmol/L (ref 98–111)
Creatinine: 1.23 mg/dL (ref 0.61–1.24)
GFR, Estimated: >60 mL/min (ref >60)
Glucose, Bld: 126 mg/dL — ABNORMAL HIGH (ref 70–99)
Potassium: 3.5 mmol/L (ref 3.5–5.1)
Sodium: 138 mmol/L (ref 135–145)
Total Bilirubin: 0.4 mg/dL (ref 0.3–1.2)
Total Protein: 6.5 g/dL (ref 6.5–8.1)

## 2022-02-24 LAB — TSH: TSH: 0.72 u[IU]/mL (ref 0.350–4.500)

## 2022-02-24 MED ORDER — SODIUM CHLORIDE 0.9 % IV SOLN
360.0000 mg | Freq: Once | INTRAVENOUS | Status: AC
  Administered 2022-02-24: 360 mg via INTRAVENOUS
  Filled 2022-02-24: qty 20

## 2022-02-24 MED ORDER — DEXAMETHASONE 1 MG PO TABS: ORAL_TABLET | ORAL | 0 refills | Status: AC

## 2022-02-24 MED ORDER — SODIUM CHLORIDE 0.9% FLUSH
10.0000 mL | INTRAVENOUS | Status: DC | PRN
  Administered 2022-02-24: 10 mL

## 2022-02-24 MED ORDER — SODIUM CHLORIDE 0.9 % IV SOLN: Freq: Once | INTRAVENOUS | Status: AC

## 2022-02-24 MED ORDER — HEPARIN SOD (PORK) LOCK FLUSH 100 UNIT/ML IV SOLN
500.0000 [IU] | Freq: Once | INTRAVENOUS | Status: AC | PRN
  Administered 2022-02-24: 500 [IU]

## 2022-02-24 NOTE — Progress Notes (Signed)
Patient is doing well on maintenance treatment without needing navigation for some time. Will discontinue active navigation.   Oncology Nurse Navigator Documentation     02/24/2022   10:15 AM  Oncology Nurse Navigator Flowsheets  Navigation Complete Date: 02/24/2022  Post Navigation: Continue to Follow Patient? No  Reason Not Navigating Patient: Patient On Maintenance Chemotherapy  Navigator Location [facility]  Navigator Encounter Type Appt/Treatment Plan Review  Patient Visit Type MedOnc  Treatment Phase Active Tx  Barriers/Navigation Needs No Barriers At This Time  Interventions None Required  Acuity Level 1-No Barriers  Support Groups/Services Friends and Family  Time Spent with Patient 15

## 2022-02-24 NOTE — Progress Notes (Signed)
Hematology and Oncology Follow Up Visit  Kevin Hunter [id number] 13-Jan-1966 56 y.o. 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 He is doing well and states that his appetite has been increased with the Decadron. He was found to have edema around his brain metastasis site where he received radiosurgery. This was felt to be pseudoprogression. He is waiting to get his taper instructions from [name]. We have reached out to see about getting his prescription filled today.  No fever, chills, n/v, cough, rash, dizziness, SOB, chest pain, palpitations, abdominal pain or changes in bowel or bladder habits.  No swelling or tenderness in his extremities.  He has tingling in the left ring and pinky fingers unchanged from baseline.  No falls or syncope reported.  As mentioned above his appetite is great. He is staying well hydrated throughout the day.  His weight is stable at 291 lbs.  No blood loss, bruising or petechiae noted.   ECOG Performance Status: 1 - Symptomatic but completely ambulatory  Medications:  Allergies as of 02/24/2022       Reactions   Paclitaxel Anaphylaxis   Penicillins Other (See Comments)   Unknown Reaction when he was an infant.        Medication List        Accurate as of February 24, 2022  9:01 AM. If you have any questions, ask your nurse or doctor.          amLODipine 5 MG tablet Commonly known as: NORVASC Take 5 mg by mouth at bedtime.   clonazePAM 1 MG tablet Commonly known as: KLONOPIN Take 1 mg by mouth 2 (two)  times daily as needed for anxiety (May take additional 0.5mg  as needed for panic attack).   dexamethasone 4 MG tablet Commonly known as: DECADRON Take 1 tablet (4 mg total) by mouth daily.   ibuprofen 200 MG tablet Commonly known as: ADVIL Take 800 mg by mouth every 6 (six) hours as needed for headache.   pseudoephedrine 30 MG tablet Commonly known as: SUDAFED Take 60 mg by mouth every 4 (four) hours as needed (headache).   psyllium 58.6 % packet Commonly known as: METAMUCIL Take 1 packet by mouth daily.   QUEtiapine 400 MG 24 hr tablet Commonly known as: SEROQUEL XR Take 400 mg by mouth at bedtime.   silver sulfADIAZINE 1 % cream Commonly known as: Silvadene Apply 1 application. topically daily.   tolterodine 4 MG 24 hr capsule Commonly known as: DETROL LA Take 4 mg by mouth at bedtime.   traZODone 100 MG tablet Commonly known as: DESYREL Take 200 mg by mouth at bedtime.   venlafaxine 75 MG tablet Commonly known as: EFFEXOR Take 150 mg by mouth at bedtime.        Allergies:  Allergies  Allergen Reactions   Paclitaxel Anaphylaxis   Penicillins Other (See Comments)    Unknown Reaction when he was an infant.    Past Medical History, Surgical history, Social history, and Family History were reviewed and updated.  Review of Systems: All  other 10 point review of systems is negative.   Physical Exam:  weight is 291 lb (132 kg). His oral temperature is 97.6 F (36.4 C). His blood pressure is 104/73 and his pulse is 66. His respiration is 17 and oxygen saturation is 97%.   Wt Readings from Last 3 Encounters:  02/24/22 291 lb (132 kg)  02/15/22 287 lb 12.8 oz (130.5 kg)  02/03/22 293 lb 1.9 oz (133 kg)    Ocular: Sclerae unicteric, pupils equal, round and reactive to light Ear-nose-throat: Oropharynx clear, dentition fair Lymphatic: No cervical or supraclavicular adenopathy Lungs no rales or rhonchi, good excursion bilaterally Heart regular rate and rhythm,  no murmur appreciated Abd soft, nontender, positive bowel sounds MSK no focal spinal tenderness, no joint edema Neuro: non-focal, well-oriented, appropriate affect Breasts: Deferred   Lab Results  Component Value Date   WBC 9.0 02/24/2022   HGB 14.3 02/24/2022   HCT 42.2 02/24/2022   MCV 97.0 02/24/2022   PLT 146 (L) 02/24/2022   No results found for: "FERRITIN", "IRON", "TIBC", "UIBC", "IRONPCTSAT" Lab Results  Component Value Date   RBC 4.35 02/24/2022   No results found for: "KPAFRELGTCHN", "LAMBDASER", "KAPLAMBRATIO" No results found for: "IGGSERUM", "IGA", "IGMSERUM" No results found for: "TOTALPROTELP", "ALBUMINELP", "A1GS", "A2GS", "BETS", "BETA2SER", "GAMS", "MSPIKE", "SPEI"   Chemistry      Component Value Date/Time   NA 138 02/24/2022 0814   K 3.5 02/24/2022 0814   CL 102 02/24/2022 0814   CO2 27 02/24/2022 0814   BUN 17 02/24/2022 0814   CREATININE 1.23 02/24/2022 0814      Component Value Date/Time   CALCIUM 9.3 02/24/2022 0814   ALKPHOS 56 02/24/2022 0814   AST 12 (L) 02/24/2022 0814   ALT 20 02/24/2022 0814   BILITOT 0.4 02/24/2022 0814       Impression and Plan: Kevin Hunter is a very pleasant 56 yo caucasian gentleman with 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  We will proceed with Opdivo treatment today as planned.  Will await Decadron taper instruction from [name].  Follow-up in 3 weeks.   Lottie Dawson, NP 7/12/20239:01 AM

## 2022-02-24 NOTE — Patient Instructions (Signed)
[facility]  Discharge Instructions: Thank you for choosing [facility] to provide your oncology and hematology care.   If you have a lab appointment with the [facility], please go directly to the [facility] and check in at the registration area.  Wear comfortable clothing and clothing appropriate for easy access to any Portacath or PICC line.   We strive to give you quality time with your provider. You may need to reschedule your appointment if you arrive late (15 or more minutes).  Arriving late affects you and other patients whose appointments are after yours.  Also, if you miss three or more appointments without notifying the office, you may be dismissed from the clinic at the provider's discretion.      For prescription refill requests, have your pharmacy contact our office and allow 72 hours for refills to be completed.    Today you received the following chemotherapy and/or immunotherapy agents Opdivo.      To help prevent nausea and vomiting after your treatment, we encourage you to take your nausea medication as directed.  BELOW ARE SYMPTOMS THAT SHOULD BE REPORTED IMMEDIATELY: *FEVER GREATER THAN 100.4 F (38 C) OR HIGHER *CHILLS OR SWEATING *NAUSEA AND VOMITING THAT IS NOT CONTROLLED WITH YOUR NAUSEA MEDICATION *UNUSUAL SHORTNESS OF BREATH *UNUSUAL BRUISING OR BLEEDING *URINARY PROBLEMS (pain or burning when urinating, or frequent urination) *BOWEL PROBLEMS (unusual diarrhea, constipation, pain near the anus) TENDERNESS IN MOUTH AND THROAT WITH OR WITHOUT PRESENCE OF ULCERS (sore throat, sores in mouth, or a toothache) UNUSUAL RASH, SWELLING OR PAIN  UNUSUAL VAGINAL DISCHARGE OR ITCHING   Items with * indicate a potential emergency and should be followed up as soon as possible or go to the Emergency Department if any problems should occur.  Please show the CHEMOTHERAPY ALERT CARD or IMMUNOTHERAPY ALERT CARD at check-in to the  Emergency Department and triage nurse. Should you have questions after your visit or need to cancel or reschedule your appointment, please contact [facility]  [phone] and follow the prompts.  Office hours are 8:00 a.m. to 4:30 p.m. Monday - Friday. Please note that voicemails left after 4:00 p.m. may not be returned until the following business day.  We are closed weekends and major holidays. You have access to a nurse at all times for urgent questions. Please call the main number to the clinic [phone] and follow the prompts.  For any non-urgent questions, you may also contact your provider using MyChart. We now offer e-Visits for anyone 58 and older to request care online for non-urgent symptoms. For details visit [email or web address].   Also download the MyChart app! Go to the app store, search "MyChart", open the app, select [facility], and log in with your MyChart username and password.  Masks are optional in the cancer centers. If you would like for your care team to wear a mask while they are taking care of you, please let them know. For doctor visits, patients may have with them one support person who is at least 55 years old. At this time, visitors are not allowed in the infusion area.

## 2022-03-08 ENCOUNTER — Other Ambulatory Visit: Payer: Self-pay

## 2022-03-16 ENCOUNTER — Other Ambulatory Visit: Payer: Self-pay

## 2022-03-17 ENCOUNTER — Encounter: Payer: Self-pay | Admitting: Hematology & Oncology

## 2022-03-17 ENCOUNTER — Inpatient Hospital Stay: Payer: 59

## 2022-03-17 ENCOUNTER — Inpatient Hospital Stay (HOSPITAL_BASED_OUTPATIENT_CLINIC_OR_DEPARTMENT_OTHER): Payer: 59 | Admitting: Hematology & Oncology

## 2022-03-17 ENCOUNTER — Inpatient Hospital Stay: Payer: 59 | Attending: Internal Medicine

## 2022-03-17 ENCOUNTER — Other Ambulatory Visit: Payer: Self-pay

## 2022-03-17 VITALS — BP 120/73 | HR 73 | Temp 97.9°F | Resp 18

## 2022-03-17 VITALS — BP 118/77 | HR 75 | Temp 97.7°F | Resp 18 | Wt 299.0 lb

## 2022-03-17 DIAGNOSIS — C7931 Secondary malignant neoplasm of brain: Secondary | ICD-10-CM | POA: Insufficient documentation

## 2022-03-17 DIAGNOSIS — C3490 Malignant neoplasm of unspecified part of unspecified bronchus or lung: Secondary | ICD-10-CM

## 2022-03-17 DIAGNOSIS — Z5112 Encounter for antineoplastic immunotherapy: Secondary | ICD-10-CM | POA: Insufficient documentation

## 2022-03-17 DIAGNOSIS — F419 Anxiety disorder, unspecified: Secondary | ICD-10-CM

## 2022-03-17 DIAGNOSIS — C7970 Secondary malignant neoplasm of unspecified adrenal gland: Secondary | ICD-10-CM | POA: Diagnosis not present

## 2022-03-17 DIAGNOSIS — C3491 Malignant neoplasm of unspecified part of right bronchus or lung: Secondary | ICD-10-CM

## 2022-03-17 DIAGNOSIS — Z79899 Other long term (current) drug therapy: Secondary | ICD-10-CM | POA: Diagnosis not present

## 2022-03-17 DIAGNOSIS — R42 Dizziness and giddiness: Secondary | ICD-10-CM | POA: Diagnosis not present

## 2022-03-17 DIAGNOSIS — C3411 Malignant neoplasm of upper lobe, right bronchus or lung: Secondary | ICD-10-CM | POA: Diagnosis present

## 2022-03-17 DIAGNOSIS — E032 Hypothyroidism due to medicaments and other exogenous substances: Secondary | ICD-10-CM

## 2022-03-17 LAB — CBC WITH DIFFERENTIAL (CANCER CENTER ONLY)
Abs Immature Granulocytes: 0.05 10*3/uL (ref 0.00–0.07)
Basophils Absolute: 0 10*3/uL (ref 0.0–0.1)
Basophils Relative: 1 %
Eosinophils Absolute: 0.1 10*3/uL (ref 0.0–0.5)
Eosinophils Relative: 2 %
HCT: 40.1 % (ref 39.0–52.0)
Hemoglobin: 13.7 g/dL (ref 13.0–17.0)
Immature Granulocytes: 1 %
Lymphocytes Relative: 24 %
Lymphs Abs: 1.4 10*3/uL (ref 0.7–4.0)
MCH: 32.8 pg (ref 26.0–34.0)
MCHC: 34.2 g/dL (ref 30.0–36.0)
MCV: 95.9 fL (ref 80.0–100.0)
Monocytes Absolute: 0.6 10*3/uL (ref 0.1–1.0)
Monocytes Relative: 10 %
Neutro Abs: 3.5 10*3/uL (ref 1.7–7.7)
Neutrophils Relative %: 62 %
Platelet Count: 143 10*3/uL — ABNORMAL LOW (ref 150–400)
RBC: 4.18 MIL/uL — ABNORMAL LOW (ref 4.22–5.81)
RDW: 14.3 % (ref 11.5–15.5)
WBC Count: 5.7 10*3/uL (ref 4.0–10.5)
nRBC: 0 % (ref 0.0–0.2)

## 2022-03-17 LAB — CMP (CANCER CENTER ONLY)
ALT: 20 U/L (ref 0–44)
AST: 17 U/L (ref 15–41)
Albumin: 4.4 g/dL (ref 3.5–5.0)
Alkaline Phosphatase: 64 U/L (ref 38–126)
Anion gap: 7 (ref 5–15)
BUN: 14 mg/dL (ref 6–20)
CO2: 29 mmol/L (ref 22–32)
Calcium: 9.3 mg/dL (ref 8.9–10.3)
Chloride: 103 mmol/L (ref 98–111)
Creatinine: 1.23 mg/dL (ref 0.61–1.24)
GFR, Estimated: >60 mL/min (ref >60)
Glucose, Bld: 93 mg/dL (ref 70–99)
Potassium: 3.4 mmol/L — ABNORMAL LOW (ref 3.5–5.1)
Sodium: 139 mmol/L (ref 135–145)
Total Bilirubin: 0.4 mg/dL (ref 0.3–1.2)
Total Protein: 6.9 g/dL (ref 6.5–8.1)

## 2022-03-17 LAB — TSH: TSH: 2.813 u[IU]/mL (ref 0.350–4.500)

## 2022-03-17 MED ORDER — SODIUM CHLORIDE 0.9% FLUSH
10.0000 mL | INTRAVENOUS | Status: DC | PRN
  Administered 2022-03-17: 10 mL

## 2022-03-17 MED ORDER — SODIUM CHLORIDE 0.9 % IV SOLN
360.0000 mg | Freq: Once | INTRAVENOUS | Status: AC
  Administered 2022-03-17: 360 mg via INTRAVENOUS
  Filled 2022-03-17: qty 24

## 2022-03-17 MED ORDER — SODIUM CHLORIDE 0.9 % IV SOLN: Freq: Once | INTRAVENOUS | Status: AC

## 2022-03-17 MED ORDER — HEPARIN SOD (PORK) LOCK FLUSH 100 UNIT/ML IV SOLN
500.0000 [IU] | Freq: Once | INTRAVENOUS | Status: AC | PRN
  Administered 2022-03-17: 500 [IU]

## 2022-03-17 NOTE — Patient Instructions (Signed)
[facility]  Discharge Instructions: Thank you for choosing [facility] to provide your oncology and hematology care.   If you have a lab appointment with the [facility], please go directly to the [facility] and check in at the registration area.  Wear comfortable clothing and clothing appropriate for easy access to any Portacath or PICC line.   We strive to give you quality time with your provider. You may need to reschedule your appointment if you arrive late (15 or more minutes).  Arriving late affects you and other patients whose appointments are after yours.  Also, if you miss three or more appointments without notifying the office, you may be dismissed from the clinic at the provider's discretion.      For prescription refill requests, have your pharmacy contact our office and allow 72 hours for refills to be completed.    Today you received the following chemotherapy and/or immunotherapy agents Nivolumab.   To help prevent nausea and vomiting after your treatment, we encourage you to take your nausea medication as directed.  BELOW ARE SYMPTOMS THAT SHOULD BE REPORTED IMMEDIATELY: *FEVER GREATER THAN 100.4 F (38 C) OR HIGHER *CHILLS OR SWEATING *NAUSEA AND VOMITING THAT IS NOT CONTROLLED WITH YOUR NAUSEA MEDICATION *UNUSUAL SHORTNESS OF BREATH *UNUSUAL BRUISING OR BLEEDING *URINARY PROBLEMS (pain or burning when urinating, or frequent urination) *BOWEL PROBLEMS (unusual diarrhea, constipation, pain near the anus) TENDERNESS IN MOUTH AND THROAT WITH OR WITHOUT PRESENCE OF ULCERS (sore throat, sores in mouth, or a toothache) UNUSUAL RASH, SWELLING OR PAIN  UNUSUAL VAGINAL DISCHARGE OR ITCHING   Items with * indicate a potential emergency and should be followed up as soon as possible or go to the Emergency Department if any problems should occur.  Please show the CHEMOTHERAPY ALERT CARD or IMMUNOTHERAPY ALERT CARD at check-in to the  Emergency Department and triage nurse. Should you have questions after your visit or need to cancel or reschedule your appointment, please contact [facility]  [phone] and follow the prompts.  Office hours are 8:00 a.m. to 4:30 p.m. Monday - Friday. Please note that voicemails left after 4:00 p.m. may not be returned until the following business day.  We are closed weekends and major holidays. You have access to a nurse at all times for urgent questions. Please call the main number to the clinic [phone] and follow the prompts.  For any non-urgent questions, you may also contact your provider using MyChart. We now offer e-Visits for anyone 2 and older to request care online for non-urgent symptoms. For details visit [email or web address].   Also download the MyChart app! Go to the app store, search "MyChart", open the app, select [facility], and log in with your MyChart username and password.  Masks are optional in the cancer centers. If you would like for your care team to wear a mask while they are taking care of you, please let them know. You may have one support person who is at least 55 years old accompany you for your appointments.

## 2022-03-17 NOTE — Progress Notes (Signed)
Hematology and Oncology Follow Up Visit  Kevin Hunter [id number] [birth year] 56 y.o. 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  We did do a MRI of the brain on him.  This was done a couple weeks ago.  This did seem to show that there was some swelling about 3 lesions.  This seemed to have had progressed since his last MRI.  He has seen [name].  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  [name] put him on a steroid taper.  He now is on Decadron 1 mg a day.  He had a little bit of dizziness.  I just wonder if his blood sugars might not be on the high side that could be doing this.  He has had no headache.  There is no nausea or vomiting.  He has had no vertigo.  He has had no change in bowel or bladder habits.  He does have some urinary frequency.  He has had no rashes.  There has been no leg swelling.  Overall, I would say his performance status is probably ECOG 1.   Medications:  Allergies as of 03/17/2022       Reactions   Paclitaxel Anaphylaxis   Penicillins Other (See Comments)   Unknown Reaction when he was an infant.        Medication List        Accurate as of March 17, 2022  8:34 AM. If you have any questions, ask your nurse or doctor.          amLODipine 5 MG tablet Commonly known as: NORVASC Take 5 mg by mouth at bedtime.   clonazePAM 1 MG tablet Commonly known as: KLONOPIN Take 1 mg by mouth 2 (two) times daily as needed for anxiety (May take additional 0.5mg   as needed for panic attack).   ibuprofen 200 MG tablet Commonly known as: ADVIL Take 800 mg by mouth every 6 (six) hours as needed for headache.   pseudoephedrine 30 MG tablet Commonly known as: SUDAFED Take 60 mg by mouth every 4 (four) hours as needed (headache).   psyllium 58.6 % packet Commonly known as: METAMUCIL Take 1 packet by mouth daily.   QUEtiapine 400 MG 24 hr tablet Commonly known as: SEROQUEL XR Take 400 mg by mouth at bedtime.   silver sulfADIAZINE 1 % cream Commonly known as: Silvadene Apply 1 application. topically daily.   tolterodine 4 MG 24 hr capsule Commonly known as: DETROL LA Take 4 mg by mouth at bedtime.   traZODone 100 MG tablet Commonly known as: DESYREL Take 200 mg by mouth at bedtime.   venlafaxine 75 MG tablet Commonly known as: EFFEXOR Take 150 mg by mouth at bedtime.        Allergies:  Allergies  Allergen Reactions   Paclitaxel Anaphylaxis   Penicillins Other (See Comments)    Unknown Reaction when he was an infant.    Past Medical History, Surgical history, Social history, and Family History were  reviewed and updated.  Review of Systems: Review of Systems  Constitutional: Negative.   HENT: Negative.    Eyes: Negative.   Respiratory: Negative.    Cardiovascular: Negative.   Gastrointestinal: Negative.   Genitourinary: Negative.   Musculoskeletal: Negative.   Skin: Negative.   Neurological:  Positive for dizziness.  Endo/Heme/Allergies: Negative.   Psychiatric/Behavioral: Negative.       Physical Exam:  weight is 299 lb (135.6 kg). His oral temperature is 97.7 F (36.5 C). His blood pressure is 118/77 and his pulse is 75. His respiration is 18 and oxygen saturation is 97%.   Wt Readings from Last 3 Encounters:  03/17/22 299 lb (135.6 kg)  02/24/22 291 lb (132 kg)  02/15/22 287 lb 12.8 oz (130.5 kg)   Physical Exam Vitals reviewed.  HENT:     Head: Normocephalic and atraumatic.  Eyes:     Pupils: Pupils are  equal, round, and reactive to light.  Cardiovascular:     Rate and Rhythm: Normal rate and regular rhythm.     Heart sounds: Normal heart sounds.  Pulmonary:     Effort: Pulmonary effort is normal.     Breath sounds: Normal breath sounds.  Abdominal:     General: Bowel sounds are normal.     Palpations: Abdomen is soft.  Musculoskeletal:        General: No tenderness or deformity. Normal range of motion.     Cervical back: Normal range of motion.  Lymphadenopathy:     Cervical: No cervical adenopathy.  Skin:    General: Skin is warm and dry.     Findings: No erythema or rash.  Neurological:     Mental Status: He is alert and oriented to person, place, and time.  Psychiatric:        Behavior: Behavior normal.        Thought Content: Thought content normal.        Judgment: Judgment normal.      Lab Results  Component Value Date   WBC 5.7 03/17/2022   HGB 13.7 03/17/2022   HCT 40.1 03/17/2022   MCV 95.9 03/17/2022   PLT 143 (L) 03/17/2022   No results found for: "FERRITIN", "IRON", "TIBC", "UIBC", "IRONPCTSAT" Lab Results  Component Value Date   RBC 4.18 (L) 03/17/2022   No results found for: "KPAFRELGTCHN", "LAMBDASER", "KAPLAMBRATIO" No results found for: "IGGSERUM", "IGA", "IGMSERUM" No results found for: "TOTALPROTELP", "ALBUMINELP", "A1GS", "A2GS", "BETS", "BETA2SER", "GAMS", "MSPIKE", "SPEI"   Chemistry      Component Value Date/Time   NA 138 02/24/2022 0814   K 3.5 02/24/2022 0814   CL 102 02/24/2022 0814   CO2 27 02/24/2022 0814   BUN 17 02/24/2022 0814   CREATININE 1.23 02/24/2022 0814      Component Value Date/Time   CALCIUM 9.3 02/24/2022 0814   ALKPHOS 56 02/24/2022 0814   AST 12 (L) 02/24/2022 0814   ALT 20 02/24/2022 0814   BILITOT 0.4 02/24/2022 0814       Impression and Plan: 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  He completed his chemotherapy along  with immunotherapy.  He had a very nice response.  He now is on maintenance therapy with nivolumab.  I am unsure what the brain lesions are.  Hopefully, they are representative of pseudoprogression.  He is going to have another MRI on August 25.  Hopefully we will see that everything has  come back and has regressed.  We will proceed with his nivolumab today.  We will plan for another follow-up in 3 weeks.  I do not think we need to do another PET scan on him probably until September. [name], MD 8/2/20238:34 AM

## 2022-03-23 ENCOUNTER — Encounter: Payer: Self-pay | Admitting: *Deleted

## 2022-04-07 ENCOUNTER — Inpatient Hospital Stay: Payer: 59

## 2022-04-07 ENCOUNTER — Telehealth: Payer: Self-pay | Admitting: *Deleted

## 2022-04-07 ENCOUNTER — Encounter: Payer: Self-pay | Admitting: Hematology & Oncology

## 2022-04-07 ENCOUNTER — Inpatient Hospital Stay (HOSPITAL_BASED_OUTPATIENT_CLINIC_OR_DEPARTMENT_OTHER): Payer: 59 | Admitting: Hematology & Oncology

## 2022-04-07 ENCOUNTER — Other Ambulatory Visit (HOSPITAL_BASED_OUTPATIENT_CLINIC_OR_DEPARTMENT_OTHER): Payer: Self-pay | Admitting: Internal Medicine

## 2022-04-07 ENCOUNTER — Ambulatory Visit (HOSPITAL_BASED_OUTPATIENT_CLINIC_OR_DEPARTMENT_OTHER)
Admission: RE | Admit: 2022-04-07 | Discharge: 2022-04-07 | Disposition: A | Discharge status: Home / Self Care | Payer: 59 | Source: Ambulatory Visit | Attending: Internal Medicine | Admitting: Internal Medicine

## 2022-04-07 VITALS — BP 122/87 | HR 94 | Temp 98.4°F | Resp 18 | Wt 291.0 lb

## 2022-04-07 DIAGNOSIS — C3491 Malignant neoplasm of unspecified part of right bronchus or lung: Secondary | ICD-10-CM

## 2022-04-07 DIAGNOSIS — C3490 Malignant neoplasm of unspecified part of unspecified bronchus or lung: Secondary | ICD-10-CM | POA: Insufficient documentation

## 2022-04-07 DIAGNOSIS — F419 Anxiety disorder, unspecified: Secondary | ICD-10-CM

## 2022-04-07 DIAGNOSIS — C7931 Secondary malignant neoplasm of brain: Secondary | ICD-10-CM | POA: Insufficient documentation

## 2022-04-07 LAB — CBC WITH DIFFERENTIAL (CANCER CENTER ONLY)
Abs Immature Granulocytes: 0.04 10*3/uL (ref 0.00–0.07)
Basophils Absolute: 0 10*3/uL (ref 0.0–0.1)
Basophils Relative: 1 %
Eosinophils Absolute: 0.1 10*3/uL (ref 0.0–0.5)
Eosinophils Relative: 1 %
HCT: 40.2 % (ref 39.0–52.0)
Hemoglobin: 13.6 g/dL (ref 13.0–17.0)
Immature Granulocytes: 1 %
Lymphocytes Relative: 17 %
Lymphs Abs: 1 10*3/uL (ref 0.7–4.0)
MCH: 32 pg (ref 26.0–34.0)
MCHC: 33.8 g/dL (ref 30.0–36.0)
MCV: 94.6 fL (ref 80.0–100.0)
Monocytes Absolute: 0.5 10*3/uL (ref 0.1–1.0)
Monocytes Relative: 9 %
Neutro Abs: 4.2 10*3/uL (ref 1.7–7.7)
Neutrophils Relative %: 71 %
Platelet Count: 147 10*3/uL — ABNORMAL LOW (ref 150–400)
RBC: 4.25 MIL/uL (ref 4.22–5.81)
RDW: 14.6 % (ref 11.5–15.5)
WBC Count: 5.8 10*3/uL (ref 4.0–10.5)
nRBC: 0 % (ref 0.0–0.2)

## 2022-04-07 LAB — CMP (CANCER CENTER ONLY)
ALT: 24 U/L (ref 0–44)
AST: 22 U/L (ref 15–41)
Albumin: 4.6 g/dL (ref 3.5–5.0)
Alkaline Phosphatase: 61 U/L (ref 38–126)
Anion gap: 10 (ref 5–15)
BUN: 14 mg/dL (ref 6–20)
CO2: 26 mmol/L (ref 22–32)
Calcium: 9.5 mg/dL (ref 8.9–10.3)
Chloride: 104 mmol/L (ref 98–111)
Creatinine: 1.19 mg/dL (ref 0.61–1.24)
GFR, Estimated: >60 mL/min (ref >60)
Glucose, Bld: 108 mg/dL — ABNORMAL HIGH (ref 70–99)
Potassium: 3.5 mmol/L (ref 3.5–5.1)
Sodium: 140 mmol/L (ref 135–145)
Total Bilirubin: 0.6 mg/dL (ref 0.3–1.2)
Total Protein: 7.7 g/dL (ref 6.5–8.1)

## 2022-04-07 LAB — TSH: TSH: 1.796 u[IU]/mL (ref 0.350–4.500)

## 2022-04-07 LAB — LACTATE DEHYDROGENASE: LDH: 220 U/L — ABNORMAL HIGH (ref 98–192)

## 2022-04-07 MED ORDER — DEXAMETHASONE 4 MG PO TABS: 8.0000 mg | ORAL_TABLET | Freq: Two times a day (BID) | ORAL | 4 refills | Status: DC

## 2022-04-07 MED ORDER — SODIUM CHLORIDE 0.9 % IV SOLN
40.0000 mg | Freq: Once | INTRAVENOUS | Status: AC
  Administered 2022-04-07: 40 mg via INTRAVENOUS
  Filled 2022-04-07: qty 4

## 2022-04-07 MED ORDER — SODIUM CHLORIDE 0.9 % IV SOLN: INTRAVENOUS | Status: AC

## 2022-04-07 MED ORDER — FLUCONAZOLE 100 MG PO TABS: 100.0000 mg | ORAL_TABLET | Freq: Every day | ORAL | 5 refills | Status: DC

## 2022-04-07 MED ORDER — GADOBUTROL 1 MMOL/ML IV SOLN
10.0000 mL | Freq: Once | INTRAVENOUS | Status: AC | PRN
  Administered 2022-04-07: 10 mL via INTRAVENOUS

## 2022-04-07 MED ORDER — PANTOPRAZOLE SODIUM 40 MG PO TBEC: 40.0000 mg | DELAYED_RELEASE_TABLET | Freq: Two times a day (BID) | ORAL | 6 refills | Status: DC

## 2022-04-07 NOTE — Patient Instructions (Signed)
Dehydration, Adult Dehydration is condition in which there is not enough water or other fluids in the body. This happens when a person loses more fluids than he or she takes in. Important body parts cannot work right without the right amount of fluids. Any loss of fluids from the body can cause dehydration. Dehydration can be mild, worse, or very bad. It should be treated right away to keep it from getting very bad. What are the causes? This condition may be caused by: Conditions that cause loss of water or other fluids, such as: Watery poop (diarrhea). Vomiting. Sweating a lot. Peeing (urinating) a lot. Not drinking enough fluids, especially when you: Are ill. Are doing things that take a lot of energy to do. Other illnesses and conditions, such as fever or infection. Certain medicines, such as medicines that take extra fluid out of the body (diuretics). Lack of safe drinking water. Not being able to get enough water and food. What increases the risk? The following factors may make you more likely to develop this condition: Having a long-term (chronic) illness that has not been treated the right way, such as: Diabetes. Heart disease. Kidney disease. Being 65 years of age or older. Having a disability. Living in a place that is high above the ground or sea (high in altitude). The thinner, dried air causes more fluid loss. Doing exercises that put stress on your body for a long time. What are the signs or symptoms? Symptoms of dehydration depend on how bad it is. Mild or worse dehydration Thirst. Dry lips or dry mouth. Feeling dizzy or light-headed, especially when you stand up from sitting. Muscle cramps. Your body making: Dark pee (urine). Pee may be the color of tea. Less pee than normal. Less tears than normal. Headache. Very bad dehydration Changes in skin. Skin may: Be cold to the touch (clammy). Be blotchy or pale. Not go back to normal right after you lightly pinch  it and let it go. Little or no tears, pee, or sweat. Changes in vital signs, such as: Fast breathing. Low blood pressure. Weak pulse. Pulse that is more than 100 beats a minute when you are sitting still. Other changes, such as: Feeling very thirsty. Eyes that look hollow (sunken). Cold hands and feet. Being mixed up (confused). Being very tired (lethargic) or having trouble waking from sleep. Short-term weight loss. Loss of consciousness. How is this treated? Treatment for this condition depends on how bad it is. Treatment should start right away. Do not wait until your condition gets very bad. Very bad dehydration is an emergency. You will need to go to a hospital. Mild or worse dehydration can be treated at home. You may be asked to: Drink more fluids. Drink an oral rehydration solution (ORS). This drink helps get the right amounts of fluids and salts and minerals in the blood (electrolytes). Very bad dehydration can be treated: With fluids through an IV tube. By getting normal levels of salts and minerals in your blood. This is often done by giving salts and minerals through a tube. The tube is passed through your nose and into your stomach. By treating the root cause. Follow these instructions at home: Oral rehydration solution If told by your doctor, drink an ORS: Make an ORS. Use instructions on the package. Start by drinking small amounts, about  cup (120 mL) every 5-10 minutes. Slowly drink more until you have had the amount that your doctor said to have. Eating and drinking          Drink enough clear fluid to keep your pee pale yellow. If you were told to drink an ORS, finish the ORS first. Then, start slowly drinking other clear fluids. Drink fluids such as: Water. Do not drink only water. Doing that can make the salt (sodium) level in your body get too low. Water from ice chips you suck on. Fruit juice that you have added water to (diluted). Low-calorie sports  drinks. Eat foods that have the right amounts of salts and minerals, such as: Bananas. Oranges. Potatoes. Tomatoes. Spinach. Do not drink alcohol. Avoid: Drinks that have a lot of sugar. These include: High-calorie sports drinks. Fruit juice that you did not add water to. Soda. Caffeine. Foods that are greasy or have a lot of fat or sugar. General instructions Take over-the-counter and prescription medicines only as told by your doctor. Do not take salt tablets. Doing that can make the salt level in your body get too high. Return to your normal activities as told by your doctor. Ask your doctor what activities are safe for you. Keep all follow-up visits as told by your doctor. This is important. Contact a doctor if: You have pain in your belly (abdomen) and the pain: Gets worse. Stays in one place. You have a rash. You have a stiff neck. You get angry or annoyed (irritable) more easily than normal. You are more tired or have a harder time waking than normal. You feel: Weak or dizzy. Very thirsty. Get help right away if you have: Any symptoms of very bad dehydration. Symptoms of vomiting, such as: You cannot eat or drink without vomiting. Your vomiting gets worse or does not go away. Your vomit has blood or green stuff in it. Symptoms that get worse with treatment. A fever. A very bad headache. Problems with peeing or pooping (having a bowel movement), such as: Watery poop that gets worse or does not go away. Blood in your poop (stool). This may cause poop to look black and tarry. Not peeing in 6-8 hours. Peeing only a small amount of very dark pee in 6-8 hours. Trouble breathing. These symptoms may be an emergency. Do not wait to see if the symptoms will go away. Get medical help right away. Call your local emergency services (911 in the [place] Do not drive yourself to the hospital. Summary Dehydration is a condition in which there is not enough water or other fluids  in the body. This happens when a person loses more fluids than he or she takes in. Treatment for this condition depends on how bad it is. Treatment should be started right away. Do not wait until your condition gets very bad. Drink enough clear fluid to keep your pee pale yellow. If you were told to drink an oral rehydration solution (ORS), finish the ORS first. Then, start slowly drinking other clear fluids. Take over-the-counter and prescription medicines only as told by your doctor. Get help right away if you have any symptoms of very bad dehydration. This information is not intended to replace advice given to you by your health care provider. Make sure you discuss any questions you have with your health care provider. Document Revised: 12/09/2021 Document Reviewed: 03/15/2019 Elsevier Patient Education  2023 [employer]  

## 2022-04-07 NOTE — Telephone Encounter (Signed)
Per 04/07/22 los - called and lvm of upcoming appointments - requested call back to confirm.

## 2022-04-07 NOTE — Progress Notes (Signed)
Hematology and Oncology Follow Up Visit  Kevin Hunter [id number] 30-May-1966 56 y.o. 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  Unfortunately, I think we have a problem.  For the past couple weeks, he has been dizzy.  He is fallen.  He has decreased use of his left arm.  Has little bit of weakness in his left leg.  There is no bowel or bladder incontinence.  He has had no visual changes.  I suspect that he probably has progression of CNS metastasis.  We are going to hold the immunotherapy right now.  I will go ahead and get him back on Decadron.  I given a dose of 40 mg IV in the office today.  I spoke with Kevin Hunter of neuro-oncology.  We will get the MRI of his brain today.  We will see what the results show.  Kevin Hunter has been very proactive with respect to try to help Kevin Hunter.  There has been no fever.  He has had no nausea or vomiting.  He has had no rashes.  There is been no diarrhea.  He has had no mouth sores.  Overall, I would say his performance status is probably ECOG 2.   Medications:  Allergies as of 04/07/2022       Reactions   Paclitaxel Anaphylaxis   Penicillins Other (See Comments)   Unknown Reaction when he was an infant.        Medication List        Accurate as of April 07, 2022  9:14 AM. If you have any questions, ask your nurse or doctor.          amLODipine 5 MG tablet Commonly known as: NORVASC Take 5 mg by mouth at bedtime.   clonazePAM 1 MG tablet Commonly known as:  KLONOPIN Take 1 mg by mouth 2 (two) times daily as needed for anxiety (May take additional 0.5mg  as needed for panic attack).   ibuprofen 200 MG tablet Commonly known as: ADVIL Take 800 mg by mouth every 6 (six) hours as needed for headache.   pseudoephedrine 30 MG tablet Commonly known as: SUDAFED Take 60 mg by mouth every 4 (four) hours as needed (headache).   psyllium 58.6 % packet Commonly known as: METAMUCIL Take 1 packet by mouth daily.   QUEtiapine 400 MG 24 hr tablet Commonly known as: SEROQUEL XR Take 400 mg by mouth at bedtime.   silver sulfADIAZINE 1 % cream Commonly known as: Silvadene Apply 1 application. topically daily.   tolterodine 4 MG 24 hr capsule Commonly known as: DETROL LA Take 4 mg by mouth at bedtime.   traZODone 100 MG tablet Commonly known as: DESYREL Take 200 mg by mouth at bedtime.   venlafaxine 75 MG tablet Commonly known as: EFFEXOR Take 150 mg by mouth at bedtime.        Allergies:  Allergies  Allergen Reactions   Paclitaxel Anaphylaxis   Penicillins Other (See Comments)    Unknown Reaction when he was an infant.  Past Medical History, Surgical history, Social history, and Family History were reviewed and updated.  Review of Systems: Review of Systems  Constitutional: Negative.   HENT: Negative.    Eyes: Negative.   Respiratory: Negative.    Cardiovascular: Negative.   Gastrointestinal: Negative.   Genitourinary: Negative.   Musculoskeletal: Negative.   Skin: Negative.   Neurological:  Positive for dizziness.  Endo/Heme/Allergies: Negative.   Psychiatric/Behavioral: Negative.       Physical Exam:  weight is 291 lb (132 kg). His oral temperature is 98.4 F (36.9 C). His blood pressure is 122/87 and his pulse is 94. His respiration is 18 and oxygen saturation is 92%.   Wt Readings from Last 3 Encounters:  04/07/22 291 lb (132 kg)  03/17/22 299 lb (135.6 kg)  02/24/22 291 lb (132 kg)   Physical Exam Vitals  reviewed.  HENT:     Head: Normocephalic and atraumatic.  Eyes:     Pupils: Pupils are equal, round, and reactive to light.  Cardiovascular:     Rate and Rhythm: Normal rate and regular rhythm.     Heart sounds: Normal heart sounds.  Pulmonary:     Effort: Pulmonary effort is normal.     Breath sounds: Normal breath sounds.  Abdominal:     General: Bowel sounds are normal.     Palpations: Abdomen is soft.  Musculoskeletal:        General: No tenderness or deformity. Normal range of motion.     Cervical back: Normal range of motion.  Lymphadenopathy:     Cervical: No cervical adenopathy.  Skin:    General: Skin is warm and dry.     Findings: No erythema or rash.  Neurological:     Mental Status: He is alert and oriented to person, place, and time.     Comments: Neurological exam does show some weakness in the left arm.  The left leg has a little bit of weakness.  Right side is okay.  There is no cranial nerve issues.  Psychiatric:        Behavior: Behavior normal.        Thought Content: Thought content normal.        Judgment: Judgment normal.      Lab Results  Component Value Date   WBC 5.8 04/07/2022   HGB 13.6 04/07/2022   HCT 40.2 04/07/2022   MCV 94.6 04/07/2022   PLT 147 (L) 04/07/2022   No results found for: "FERRITIN", "IRON", "TIBC", "UIBC", "IRONPCTSAT" Lab Results  Component Value Date   RBC 4.25 04/07/2022   No results found for: "KPAFRELGTCHN", "LAMBDASER", "KAPLAMBRATIO" No results found for: "IGGSERUM", "IGA", "IGMSERUM" No results found for: "TOTALPROTELP", "ALBUMINELP", "A1GS", "A2GS", "BETS", "BETA2SER", "GAMS", "MSPIKE", "SPEI"   Chemistry      Component Value Date/Time   NA 140 04/07/2022 0815   K 3.5 04/07/2022 0815   CL 104 04/07/2022 0815   CO2 26 04/07/2022 0815   BUN 14 04/07/2022 0815   CREATININE 1.19 04/07/2022 0815      Component Value Date/Time   CALCIUM 9.5 04/07/2022 0815   ALKPHOS 61 04/07/2022 0815   AST 22 04/07/2022  0815   ALT 24 04/07/2022 0815   BILITOT 0.6 04/07/2022 0815       Impression and Plan: Kevin Hunter is a very pleasant 56 yo caucasian gentleman with 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  He completed his chemotherapy along with immunotherapy.  He had a very nice response.  He now is on maintenance therapy with nivolumab.  Again, I suspect that he probably does have progressive CNS metastasis.  We will see about the brain MRI.  We will get this done today.  I am going to hold his immunotherapy right now.  I will give him a dose of Decadron.  I will then call some Decadron in for him.  He will also need to have some Diflucan and also some Pepcid I think to try to help prevent with side effects.  We will have to see what the MRI of the brain shows.  I suspect that if there is progression, that he will need to have some form of radiation.  We will plan to get him back here probably in about 2 weeks or so to see how he is doing.   [name], MD 8/23/20239:14 AM

## 2022-04-08 ENCOUNTER — Telehealth: Payer: Self-pay | Admitting: *Deleted

## 2022-04-08 ENCOUNTER — Other Ambulatory Visit: Payer: Self-pay

## 2022-04-08 NOTE — Telephone Encounter (Signed)
Report called from [facility] on MRI Brain.  Per Dr Mickeal Skinner patient should start Decadron 4 mg BID and continue until he sees patient next week after Brain Tumor board review.    Dr Marin Olp put patient back on Decadron yesterday at 8 mg BID.  Attempted to call patient to advise to dose reduce.  Left message pending call back.

## 2022-04-09 ENCOUNTER — Other Ambulatory Visit: Payer: 59

## 2022-04-12 ENCOUNTER — Other Ambulatory Visit: Payer: Self-pay

## 2022-04-12 ENCOUNTER — Inpatient Hospital Stay (HOSPITAL_BASED_OUTPATIENT_CLINIC_OR_DEPARTMENT_OTHER): Payer: 59 | Admitting: Internal Medicine

## 2022-04-12 ENCOUNTER — Inpatient Hospital Stay: Payer: 59

## 2022-04-12 VITALS — BP 135/84 | HR 69 | Temp 97.8°F | Resp 19 | Ht 70.0 in | Wt 290.4 lb

## 2022-04-12 DIAGNOSIS — C7931 Secondary malignant neoplasm of brain: Secondary | ICD-10-CM | POA: Diagnosis not present

## 2022-04-12 DIAGNOSIS — C3490 Malignant neoplasm of unspecified part of unspecified bronchus or lung: Secondary | ICD-10-CM | POA: Diagnosis not present

## 2022-04-12 DIAGNOSIS — Z5112 Encounter for antineoplastic immunotherapy: Secondary | ICD-10-CM | POA: Diagnosis not present

## 2022-04-12 MED ORDER — DEXAMETHASONE 2 MG PO TABS: ORAL_TABLET | ORAL | 0 refills | Status: AC

## 2022-04-12 NOTE — Progress Notes (Signed)
[place] at [place],  [id number] [phone]   Interval Evaluation  Date of Service: 04/12/22 Patient Name: Kevin Hunter Patient MRN: [id number] Patient DOB: Dec 13, 1965 Provider: Ventura Sellers, MD  Identifying Statement:  Kevin Hunter is a 56 y.o. male with Non-small cell lung cancer metastatic to brain [facility])   Primary Cancer:  Oncologic History: Oncology History  Lung cancer, primary, with metastasis from lung to other site, right ([place])  05/27/2021 Initial Diagnosis   Lung cancer, primary, with metastasis from lung to other site, right [facility])   05/27/2021 Cancer Staging   Staging form: Lung, AJCC 8th Edition - Clinical stage from 05/27/2021: Stage IV (cT3, cN2, pM1) - Signed by [name], MD on 05/27/2021 Histologic grade (G): G2 Histologic grading system: 4 grade system   06/25/2021 - 07/14/2021 Chemotherapy   Patient is on Treatment Plan : LUNG NSCLC SQUAMOUS Nivolumab + Ipilimumab + Carboplatin + Paclitaxel q42d X 1 cycle / Nivolumab + Ipilimumab q42d     07/22/2021 - 10/16/2021 Chemotherapy   Patient is on Treatment Plan : LUNG Carboplatin / Docetaxel q21d     07/22/2021 -  Chemotherapy   Patient is on Treatment Plan : LUNG Nivolumab q21d      CNS Oncologic History 06/12/21: SRS x3 [facility])  Interval History: Kevin Hunter presents today for follow up with new and progressive neurologic symptoms.  He describes significant worsening of left arm and leg weakness over the past 3 weeks.  He had tapered off of his steroids prior to the decline.  He had at least one fall due to leg weakness, and his arm "could not lift in the air".  Several days ago he was started back on decadron, his strength has improved considerably, although he is not quite back to prior baseline.  Today he walked into clinic with a cane assist.  Still having some clumsiness in his left hand.  Dr. Marin Olp held opdivo because of  the steroids.    H+P (02/08/22) Patient presents today for follow up after recent neurologic symptoms.  He describes numbness and weakness of his left arm and leg, progressive over several weeks.  He began dragging the left leg, interfering with ambulation.  Also describes issues with cognition, short term memory.  Morning headaches and blurry vision have accompanied these changes.  Decadron was started late last week, this has led to significantly improved symptom burden.  He feels close to his baseline at this time.  Continues on opdivo with Dr. Marin Olp.    Medications: Current Outpatient Medications on File Prior to Visit  Medication Sig Dispense Refill   amLODipine (NORVASC) 5 MG tablet Take 5 mg by mouth at bedtime.     clonazePAM (KLONOPIN) 1 MG tablet Take 1 mg by mouth 2 (two) times daily as needed for anxiety (May take additional 0.5mg  as needed for panic attack).     dexamethasone (DECADRON) 4 MG tablet Take 2 tablets (8 mg total) by mouth 2 (two) times daily. 100 tablet 4   fluconazole (DIFLUCAN) 100 MG tablet Take 1 tablet (100 mg total) by mouth daily. 30 tablet 5   ibuprofen (ADVIL) 200 MG tablet Take 800 mg by mouth every 6 (six) hours as needed for headache.     pantoprazole (PROTONIX) 40 MG tablet Take 1 tablet (40 mg total) by mouth 2 (two) times daily. 60 tablet 6   pseudoephedrine (SUDAFED) 30 MG tablet Take 60 mg  by mouth every 4 (four) hours as needed (headache).     psyllium (METAMUCIL) 58.6 % packet Take 1 packet by mouth daily.     QUEtiapine (SEROQUEL XR) 400 MG 24 hr tablet Take 400 mg by mouth at bedtime.     silver sulfADIAZINE (SILVADENE) 1 % cream Apply 1 application. topically daily. 50 g 2   tolterodine (DETROL LA) 4 MG 24 hr capsule Take 4 mg by mouth at bedtime.     traZODone (DESYREL) 100 MG tablet Take 200 mg by mouth at bedtime.     venlafaxine (EFFEXOR) 75 MG tablet Take 150 mg by mouth at bedtime.     [DISCONTINUED] prochlorperazine (COMPAZINE) 10 MG tablet  Take 1 tablet (10 mg total) by mouth every 6 (six) hours as needed (Nausea or vomiting). 30 tablet 1   No current facility-administered medications on file prior to visit.    Allergies:  Allergies  Allergen Reactions   Paclitaxel Anaphylaxis   Penicillins Other (See Comments)    Unknown Reaction when he was an infant.   Past Medical History:  Past Medical History:  Diagnosis Date   Anxiety    Bipolar 1 disorder ([name])    Goals of care, counseling/discussion 05/27/2021   Hypertension    Lung cancer, primary, with metastasis from lung to other site, right ([place]) 05/27/2021   Mood disorder ([place])    Sleep apnea    Past Surgical History:  Past Surgical History:  Procedure Laterality Date   BRONCHIAL BIOPSY  05/22/2021   Procedure: BRONCHIAL BIOPSIES;  Surgeon: Garner Nash, DO;  Location: [facility];  Service: Pulmonary;;   BRONCHIAL BRUSHINGS  05/22/2021   Procedure: BRONCHIAL BRUSHINGS;  Surgeon: Garner Nash, DO;  Location: [facility];  Service: Pulmonary;;   BRONCHIAL NEEDLE ASPIRATION BIOPSY  05/22/2021   Procedure: BRONCHIAL NEEDLE ASPIRATION BIOPSIES;  Surgeon: Garner Nash, DO;  Location: [facility];  Service: Pulmonary;;   IR IMAGING GUIDED PORT INSERTION  06/24/2021   VIDEO BRONCHOSCOPY WITH ENDOBRONCHIAL NAVIGATION N/A 05/22/2021   Procedure: VIDEO BRONCHOSCOPY WITH ENDOBRONCHIAL NAVIGATION;  Surgeon: Garner Nash, DO;  Location: [facility];  Service: Pulmonary;  Laterality: N/A;   VIDEO BRONCHOSCOPY WITH RADIAL ENDOBRONCHIAL ULTRASOUND  05/22/2021   Procedure: RADIAL ENDOBRONCHIAL ULTRASOUND;  Surgeon: Garner Nash, DO;  Location: [facility];  Service: Pulmonary;;   Social History:  Social History   Socioeconomic History   Marital status: Single    Spouse name: Not on file   Number of children: Not on file   Years of education: Not on file   Highest education level: Not on file  Occupational History   Not on file  Tobacco Use   Smoking  status: Former    Types: Cigarettes, E-cigarettes    Start date: 05/16/2018    Quit date: 05/16/2018    Years since quitting: 3.9   Smokeless tobacco: Never  Vaping Use   Vaping Use: Never used  Substance and Sexual Activity   Alcohol use: Never   Drug use: Never   Sexual activity: Not on file  Other Topics Concern   Not on file  Social History Narrative   Not on file   Social Determinants of Health   Financial Resource Strain: Not on file  Food Insecurity: Not on file  Transportation Needs: Not on file  Physical Activity: Not on file  Stress: Not on file  Social Connections: Not on file  Intimate Partner Violence: Not At Risk (05/28/2021)   Humiliation, Afraid,  Rape, and Kick questionnaire    Fear of Current or Ex-Partner: No    Emotionally Abused: No    Physically Abused: No    Sexually Abused: No   Family History:  Family History  Problem Relation Age of Onset   Breast cancer Mother    Lung cancer Father     Review of Systems: Constitutional: Doesn't report fevers, chills or abnormal weight loss Eyes: Doesn't report blurriness of vision Ears, nose, mouth, throat, and face: Doesn't report sore throat Respiratory: Doesn't report cough, dyspnea or wheezes Cardiovascular: Doesn't report palpitation, chest discomfort  Gastrointestinal:  Doesn't report nausea, constipation, diarrhea GU: Doesn't report incontinence Skin: Doesn't report skin rashes Neurological: Per HPI Musculoskeletal: Doesn't report joint pain Behavioral/Psych: Doesn't report anxiety  Physical Exam: There were no vitals filed for this visit.  KPS: 80. General: Alert, cooperative, pleasant, in no acute distress Head: Normal EENT: No conjunctival injection or scleral icterus.  Lungs: Resp effort normal Cardiac: Regular rate Abdomen: Non-distended abdomen Skin: No rashes cyanosis or petechiae. Extremities: No clubbing or edema  Neurologic Exam: Mental Status: Awake, alert, attentive to  examiner. Oriented to self and environment. Language is fluent with intact comprehension.  Cranial Nerves: Visual acuity is grossly normal. Visual fields are full. Extra-ocular movements intact. No ptosis. Face is symmetric Motor: Tone and bulk are normal. Power is 4+/5 in left arm and leg with noted drift. Reflexes are symmetric, no pathologic reflexes present.  Sensory: Intact to light touch Gait: Independent   Labs: I have reviewed the data as listed    Component Value Date/Time   NA 140 04/07/2022 0815   K 3.5 04/07/2022 0815   CL 104 04/07/2022 0815   CO2 26 04/07/2022 0815   GLUCOSE 108 (H) 04/07/2022 0815   BUN 14 04/07/2022 0815   CREATININE 1.19 04/07/2022 0815   CALCIUM 9.5 04/07/2022 0815   PROT 7.7 04/07/2022 0815   ALBUMIN 4.6 04/07/2022 0815   AST 22 04/07/2022 0815   ALT 24 04/07/2022 0815   ALKPHOS 61 04/07/2022 0815   BILITOT 0.6 04/07/2022 0815   GFRNONAA >60 04/07/2022 0815   Lab Results  Component Value Date   WBC 5.8 04/07/2022   NEUTROABS 4.2 04/07/2022   HGB 13.6 04/07/2022   HCT 40.2 04/07/2022   MCV 94.6 04/07/2022   PLT 147 (L) 04/07/2022    Imaging:  MR BRAIN W WO CONTRAST  Result Date: 04/08/2022 CLINICAL DATA:  Brain/CNS neoplasm, assess treatment response. Non-small cell lung cancer metastatic to brain. Weakness in left arm and leg for 2 weeks. EXAM: MRI HEAD WITHOUT AND WITH CONTRAST TECHNIQUE: Multiplanar, multiecho pulse sequences of the brain and surrounding structures were obtained without and with intravenous contrast. CONTRAST:  44mL GADAVIST GADOBUTROL 1 MMOL/ML IV SOLN COMPARISON:  Brain MRI 02/09/2022. FINDINGS: Brain: Continued interval increase in size of a metastasis within the high right frontal lobe, now measuring 2.9 x 3.0 cm in transaxial dimensions (previously 2.8 x 2.4 cm). As before, the mass demonstrates irregular peripheral predominant enhancement. Surrounding edema has also increased, now prominent. Progressive partial  effacement the right lateral ventricle. There is now 3 mm leftward midline shift measured at the superior aspect of the septum pellucidum. The metastasis within the anterior left frontal lobe has slightly increased in size, now measuring 2.2 x 1.5 cm on the coronal T1-weighted post-contrast sequence (previously 2.0 x 1.1 cm). This mass also demonstrates irregular peripheral predominant enhancement. Mild-to-moderate surrounding vasogenic edema has also increased. The left parietal lobe metastasis  has slightly increased in size, now measuring 2.5 x 1.8 cm in transaxial dimensions (previously 2.1 x 1.5 cm). As before, the mass demonstrates peripheral predominant enhancement. Mild-to-moderate surrounding vasogenic edema has also increased. As before, there is a mild T2* signal loss associated with the lesions within the superior right frontal lobe and left parietal lobe, which may reflect chronic hemorrhage and/or mineralization. No new intracranial metastasis is identified. Partially empty sella turcica, a nonspecific finding. There is no acute infarct. No extra-axial fluid collection. Vascular: Maintained flow voids within the proximal large arterial vessels. Skull and upper cervical spine: No focal suspicious marrow lesion. Sinuses/Orbits: No mass or acute finding within the imaged orbits. No significant paranasal sinus disease. These results will be called to the ordering clinician or representative by the Radiologist Assistant, and communication documented in the PACS or [employer]. IMPRESSION: Continued increase in size of all three treated brain metastases since the prior MRI of 02/09/2022. Vasogenic edema surrounding these lesions has also increased. Most notably, edema surrounding the superior right frontal lobe metastasis is now prominent. Progressive partial effacement the right lateral ventricle, now with 3 mm leftward midline shift. These findings may reflect post-radiation changes. However, tumor  progression cannot be excluded and a minimum of short-interval MRI follow-up is recommended. Electronically Signed   By: Kellie Simmering D.O.   On: 04/08/2022 11:00    [place] Clinician Interpretation: I have personally reviewed the radiological images as listed.  My interpretation, in the context of the patient's clinical presentation, is likely treatment effect   Assessment/Plan Non-small cell lung cancer metastatic to brain [facility])  [name] is clinically progressive today.  MRI brain demonstrates dramatic increase in T2/FLAIR signal burden, with only subtle increase in enhancing volume.  These changes, especially when combined with very good response to pule steroids, are indicative of radio-inflammatory change.    We again recommended careful surveillance, with repeat MRI brain in 2 months.    Decadron should con't 4mg  BID for 2-3 additional days.  Then, starting at 4mg  daily, decadron should be decreased by 1mg  every 14 days week until discontinued, if tolerated.  This 2 month taper will provide additional period of support compared to prior taper.    Dr. Marin Olp will decide on timing of resuming opdivo.  We appreciate the opportunity to participate in the care of SAULO ANTHIS.  He will let us know earlier if left sided weakness recurs or worsens at any point in the next 2 months.  We ask that [name] return to clinic in 2 months following next brain MRI, or sooner as needed.  All questions were answered. The patient knows to call the clinic with any problems, questions or concerns. No barriers to learning were detected.  The total time spent in the encounter was 40 minutes and more than 50% was on counseling and review of test results   Ventura Sellers, MD Medical Director of Neuro-Oncology [facility] [place] 04/12/22 8:14 AM

## 2022-04-13 ENCOUNTER — Other Ambulatory Visit: Payer: Self-pay | Admitting: Radiation Therapy

## 2022-04-13 ENCOUNTER — Telehealth: Payer: Self-pay | Admitting: Internal Medicine

## 2022-04-13 NOTE — Telephone Encounter (Signed)
Per 8/28 los called and left message for pt about appointment

## 2022-04-14 ENCOUNTER — Other Ambulatory Visit: Payer: Self-pay

## 2022-04-16 ENCOUNTER — Other Ambulatory Visit: Payer: Self-pay

## 2022-05-03 ENCOUNTER — Encounter: Payer: Self-pay | Admitting: Hematology & Oncology

## 2022-05-05 ENCOUNTER — Encounter: Payer: Self-pay | Admitting: Family

## 2022-05-05 ENCOUNTER — Inpatient Hospital Stay (HOSPITAL_BASED_OUTPATIENT_CLINIC_OR_DEPARTMENT_OTHER): Payer: 59 | Admitting: Family

## 2022-05-05 ENCOUNTER — Inpatient Hospital Stay: Payer: 59

## 2022-05-05 ENCOUNTER — Inpatient Hospital Stay: Payer: 59 | Attending: Internal Medicine

## 2022-05-05 VITALS — BP 120/82 | HR 74 | Temp 98.6°F | Resp 18

## 2022-05-05 DIAGNOSIS — C3490 Malignant neoplasm of unspecified part of unspecified bronchus or lung: Secondary | ICD-10-CM

## 2022-05-05 DIAGNOSIS — C7931 Secondary malignant neoplasm of brain: Secondary | ICD-10-CM | POA: Diagnosis not present

## 2022-05-05 DIAGNOSIS — C3491 Malignant neoplasm of unspecified part of right bronchus or lung: Secondary | ICD-10-CM | POA: Diagnosis not present

## 2022-05-05 DIAGNOSIS — C3411 Malignant neoplasm of upper lobe, right bronchus or lung: Secondary | ICD-10-CM | POA: Insufficient documentation

## 2022-05-05 DIAGNOSIS — C7970 Secondary malignant neoplasm of unspecified adrenal gland: Secondary | ICD-10-CM | POA: Diagnosis not present

## 2022-05-05 DIAGNOSIS — Z5112 Encounter for antineoplastic immunotherapy: Secondary | ICD-10-CM | POA: Diagnosis present

## 2022-05-05 LAB — CBC WITH DIFFERENTIAL (CANCER CENTER ONLY)
Abs Immature Granulocytes: 0.11 10*3/uL — ABNORMAL HIGH (ref 0.00–0.07)
Basophils Absolute: 0 10*3/uL (ref 0.0–0.1)
Basophils Relative: 0 %
Eosinophils Absolute: 0 10*3/uL (ref 0.0–0.5)
Eosinophils Relative: 0 %
HCT: 39.5 % (ref 39.0–52.0)
Hemoglobin: 13.5 g/dL (ref 13.0–17.0)
Immature Granulocytes: 1 %
Lymphocytes Relative: 11 %
Lymphs Abs: 0.9 10*3/uL (ref 0.7–4.0)
MCH: 32.9 pg (ref 26.0–34.0)
MCHC: 34.2 g/dL (ref 30.0–36.0)
MCV: 96.3 fL (ref 80.0–100.0)
Monocytes Absolute: 0.4 10*3/uL (ref 0.1–1.0)
Monocytes Relative: 5 %
Neutro Abs: 6.7 10*3/uL (ref 1.7–7.7)
Neutrophils Relative %: 83 %
Platelet Count: 99 10*3/uL — ABNORMAL LOW (ref 150–400)
RBC: 4.1 MIL/uL — ABNORMAL LOW (ref 4.22–5.81)
RDW: 15.4 % (ref 11.5–15.5)
WBC Count: 8.1 10*3/uL (ref 4.0–10.5)
nRBC: 0 % (ref 0.0–0.2)

## 2022-05-05 LAB — CMP (CANCER CENTER ONLY)
ALT: 26 U/L (ref 0–44)
AST: 13 U/L — ABNORMAL LOW (ref 15–41)
Albumin: 4.1 g/dL (ref 3.5–5.0)
Alkaline Phosphatase: 61 U/L (ref 38–126)
Anion gap: 9 (ref 5–15)
BUN: 19 mg/dL (ref 6–20)
CO2: 26 mmol/L (ref 22–32)
Calcium: 8.9 mg/dL (ref 8.9–10.3)
Chloride: 101 mmol/L (ref 98–111)
Creatinine: 1 mg/dL (ref 0.61–1.24)
GFR, Estimated: >60 mL/min (ref >60)
Glucose, Bld: 143 mg/dL — ABNORMAL HIGH (ref 70–99)
Potassium: 3.5 mmol/L (ref 3.5–5.1)
Sodium: 136 mmol/L (ref 135–145)
Total Bilirubin: 0.4 mg/dL (ref 0.3–1.2)
Total Protein: 6.6 g/dL (ref 6.5–8.1)

## 2022-05-05 LAB — LACTATE DEHYDROGENASE: LDH: 224 U/L — ABNORMAL HIGH (ref 98–192)

## 2022-05-05 MED ORDER — SODIUM CHLORIDE 0.9 % IV SOLN: Freq: Once | INTRAVENOUS | Status: AC

## 2022-05-05 MED ORDER — SODIUM CHLORIDE 0.9% FLUSH
10.0000 mL | INTRAVENOUS | Status: DC | PRN
  Administered 2022-05-05: 10 mL

## 2022-05-05 MED ORDER — HEPARIN SOD (PORK) LOCK FLUSH 100 UNIT/ML IV SOLN
500.0000 [IU] | Freq: Once | INTRAVENOUS | Status: AC | PRN
  Administered 2022-05-05: 500 [IU]

## 2022-05-05 MED ORDER — SODIUM CHLORIDE 0.9 % IV SOLN
360.0000 mg | Freq: Once | INTRAVENOUS | Status: AC
  Administered 2022-05-05: 360 mg via INTRAVENOUS
  Filled 2022-05-05: qty 12

## 2022-05-05 NOTE — Progress Notes (Signed)
Hematology and Oncology Follow Up Visit  Kevin Hunter [id number] 1966-03-15 56 y.o. 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 He was able to see Kevin Hunter 3 weeks ago and MRI of the brain noted him to have significant progression of disease. At this time, patient is on a Decadron taper at 1 mg PO daily for another 2 weeks. He states that his weakness in the left side has resolved. He has occasional mild dizziness but this has also improved quite a bit and he has not had any new falls.  He will have a repeat MRI of the brain with Kevin Hunter in later October to reassess.  No fever, chills, n/v, cough, rash, SOB, chest pain, palpitations, abdominal pain or changes in bowel or bladder habits.  No blood loss noted. No bruising or petechiae.  No swelling, tenderness, numbness or tingling in his extremities at this time.  No syncope reported.  Appetite is GREAT on decadron. Weight is up 11 lbs. Hydration has also been good.   ECOG Performance Status: 1 - Symptomatic but completely ambulatory  Medications:  Allergies as of 05/05/2022       Reactions   Paclitaxel Anaphylaxis   Penicillins Other (See Comments)   Unknown Reaction when he was an infant.        Medication List        Accurate as of May 05, 2022 11:54 AM. If you have any questions, ask your nurse or doctor.          amLODipine 5 MG tablet Commonly known as: NORVASC Take 5 mg by mouth at bedtime.   clonazePAM 1 MG tablet Commonly known as:  KLONOPIN Take 1 mg by mouth 2 (two) times daily as needed for anxiety (May take additional 0.5mg  as needed for panic attack).   dexamethasone 2 MG tablet Commonly known as: DECADRON Take 2 tablets (4 mg total) by mouth daily with breakfast for 14 days, THEN 1.5 tablets (3 mg total) daily with breakfast for 14 days, THEN 1 tablet (2 mg total) daily with breakfast for 14 days, THEN 0.5 tablets (1 mg total) daily with breakfast for 14 days. Start taking on: April 12, 2022   fluconazole 100 MG tablet Commonly known as: DIFLUCAN Take 1 tablet (100 mg total) by mouth daily.   ibuprofen 200 MG tablet Commonly known as: ADVIL Take 800 mg by mouth every 6 (six) hours as needed for headache.   pantoprazole 40 MG tablet Commonly known as: Protonix Take 1 tablet (40 mg total) by mouth 2 (two) times daily.   pseudoephedrine 30 MG tablet Commonly known as: SUDAFED Take 60 mg by mouth every 4 (four) hours as needed (headache).   psyllium 58.6 % packet Commonly known as: METAMUCIL Take 1 packet by mouth daily.   QUEtiapine 400 MG 24 hr tablet Commonly known as: SEROQUEL XR Take 400 mg by mouth at bedtime.   silver sulfADIAZINE 1 % cream Commonly known as: Silvadene Apply 1 application.  topically daily.   tolterodine 4 MG 24 hr capsule Commonly known as: DETROL LA Take 4 mg by mouth at bedtime.   traZODone 100 MG tablet Commonly known as: DESYREL Take 200 mg by mouth at bedtime.   venlafaxine 75 MG tablet Commonly known as: EFFEXOR Take 150 mg by mouth at bedtime.        Allergies:  Allergies  Allergen Reactions   Paclitaxel Anaphylaxis   Penicillins Other (See Comments)    Unknown Reaction when he was an infant.    Past Medical History, Surgical history, Social history, and Family History were reviewed and updated.  Review of Systems: All other 10 point review of systems is negative.   Physical Exam:  oral temperature is 98.6 F (37 C). His blood pressure is 120/82  and his pulse is 74. His respiration is 18 and oxygen saturation is 98%.   Wt Readings from Last 3 Encounters:  04/12/22 290 lb 6.4 oz (131.7 kg)  04/07/22 291 lb (132 kg)  03/17/22 299 lb (135.6 kg)    Ocular: Sclerae unicteric, pupils equal, round and reactive to light Ear-nose-throat: Oropharynx clear, dentition fair Lymphatic: No cervical or supraclavicular adenopathy Lungs no rales or rhonchi, good excursion bilaterally Heart regular rate and rhythm, no murmur appreciated Abd soft, nontender, positive bowel sounds MSK no focal spinal tenderness, no joint edema Neuro: non-focal, well-oriented, appropriate affect Breasts: Deferred   Lab Results  Component Value Date   WBC 8.1 05/05/2022   HGB 13.5 05/05/2022   HCT 39.5 05/05/2022   MCV 96.3 05/05/2022   PLT 99 (L) 05/05/2022   No results found for: "FERRITIN", "IRON", "TIBC", "UIBC", "IRONPCTSAT" Lab Results  Component Value Date   RBC 4.10 (L) 05/05/2022   No results found for: "KPAFRELGTCHN", "LAMBDASER", "KAPLAMBRATIO" No results found for: "IGGSERUM", "IGA", "IGMSERUM" No results found for: "TOTALPROTELP", "ALBUMINELP", "A1GS", "A2GS", "BETS", "BETA2SER", "GAMS", "MSPIKE", "SPEI"   Chemistry      Component Value Date/Time   NA 136 05/05/2022 1055   K 3.5 05/05/2022 1055   CL 101 05/05/2022 1055   CO2 26 05/05/2022 1055   BUN 19 05/05/2022 1055   CREATININE 1.00 05/05/2022 1055      Component Value Date/Time   CALCIUM 8.9 05/05/2022 1055   ALKPHOS 61 05/05/2022 1055   AST 13 (L) 05/05/2022 1055   ALT 26 05/05/2022 1055   BILITOT 0.4 05/05/2022 1055       Impression and Plan: Kevin Hunter is a very pleasant 56 yo caucasian gentleman with 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  He completed his chemotherapy along with immunotherapy.  He had a very nice response.  He now is on maintenance therapy with nivolumab. He was noted to have progression of disease on recent brain MRI and has  had a nice response to the decadron taper.  He is currently on Decadron 1 mg PO daily.  I spoke with Kevin Hunter and we will proceed with Opdivo treatment today per MD.  Follow-up in 2 weeks.   Kevin Dawson, NP 9/20/202311:54 AM

## 2022-05-05 NOTE — Progress Notes (Signed)
Patient now taking Dex 1mg  po daily.  Raul Del Lawton, [place], BCPS, BCOP 05/05/2022 12:32 PM

## 2022-05-05 NOTE — Patient Instructions (Signed)
[facility]  Discharge Instructions: Thank you for choosing [facility] to provide your oncology and hematology care.   If you have a lab appointment with the [facility], please go directly to the [facility] and check in at the registration area.  Wear comfortable clothing and clothing appropriate for easy access to any Portacath or PICC line.   We strive to give you quality time with your provider. You may need to reschedule your appointment if you arrive late (15 or more minutes).  Arriving late affects you and other patients whose appointments are after yours.  Also, if you miss three or more appointments without notifying the office, you may be dismissed from the clinic at the provider's discretion.      For prescription refill requests, have your pharmacy contact our office and allow 72 hours for refills to be completed.    Today you received the following chemotherapy and/or immunotherapy agents:  nivolumab      To help prevent nausea and vomiting after your treatment, we encourage you to take your nausea medication as directed.  BELOW ARE SYMPTOMS THAT SHOULD BE REPORTED IMMEDIATELY: *FEVER GREATER THAN 100.4 F (38 C) OR HIGHER *CHILLS OR SWEATING *NAUSEA AND VOMITING THAT IS NOT CONTROLLED WITH YOUR NAUSEA MEDICATION *UNUSUAL SHORTNESS OF BREATH *UNUSUAL BRUISING OR BLEEDING *URINARY PROBLEMS (pain or burning when urinating, or frequent urination) *BOWEL PROBLEMS (unusual diarrhea, constipation, pain near the anus) TENDERNESS IN MOUTH AND THROAT WITH OR WITHOUT PRESENCE OF ULCERS (sore throat, sores in mouth, or a toothache) UNUSUAL RASH, SWELLING OR PAIN  UNUSUAL VAGINAL DISCHARGE OR ITCHING   Items with * indicate a potential emergency and should be followed up as soon as possible or go to the Emergency Department if any problems should occur.  Please show the CHEMOTHERAPY ALERT CARD or IMMUNOTHERAPY ALERT CARD at check-in to the  Emergency Department and triage nurse. Should you have questions after your visit or need to cancel or reschedule your appointment, please contact [facility]  [phone] and follow the prompts.  Office hours are 8:00 a.m. to 4:30 p.m. Monday - Friday. Please note that voicemails left after 4:00 p.m. may not be returned until the following business day.  We are closed weekends and major holidays. You have access to a nurse at all times for urgent questions. Please call the main number to the clinic [phone] and follow the prompts.  For any non-urgent questions, you may also contact your provider using MyChart. We now offer e-Visits for anyone 36 and older to request care online for non-urgent symptoms. For details visit [email or web address].   Also download the MyChart app! Go to the app store, search "MyChart", open the app, select [facility], and log in with your MyChart username and password.  Masks are optional in the cancer centers. If you would like for your care team to wear a mask while they are taking care of you, please let them know. You may have one support person who is at least 55 years old accompany you for your appointments.

## 2022-05-05 NOTE — Progress Notes (Signed)
OK to treat with platelets value from today per Lottie Dawson, NP.

## 2022-05-20 ENCOUNTER — Ambulatory Visit: Payer: 59

## 2022-05-20 ENCOUNTER — Other Ambulatory Visit: Payer: 59

## 2022-05-20 ENCOUNTER — Ambulatory Visit: Payer: 59 | Admitting: Hematology & Oncology

## 2022-05-25 ENCOUNTER — Inpatient Hospital Stay (HOSPITAL_BASED_OUTPATIENT_CLINIC_OR_DEPARTMENT_OTHER): Payer: 59 | Admitting: Hematology & Oncology

## 2022-05-25 ENCOUNTER — Encounter: Payer: Self-pay | Admitting: Hematology & Oncology

## 2022-05-25 ENCOUNTER — Inpatient Hospital Stay: Payer: 59

## 2022-05-25 ENCOUNTER — Inpatient Hospital Stay: Payer: 59 | Attending: Internal Medicine

## 2022-05-25 DIAGNOSIS — C3491 Malignant neoplasm of unspecified part of right bronchus or lung: Secondary | ICD-10-CM | POA: Diagnosis not present

## 2022-05-25 DIAGNOSIS — C3411 Malignant neoplasm of upper lobe, right bronchus or lung: Secondary | ICD-10-CM | POA: Diagnosis present

## 2022-05-25 DIAGNOSIS — Z5112 Encounter for antineoplastic immunotherapy: Secondary | ICD-10-CM | POA: Insufficient documentation

## 2022-05-25 DIAGNOSIS — Z79899 Other long term (current) drug therapy: Secondary | ICD-10-CM | POA: Insufficient documentation

## 2022-05-25 DIAGNOSIS — C7931 Secondary malignant neoplasm of brain: Secondary | ICD-10-CM | POA: Diagnosis not present

## 2022-05-25 DIAGNOSIS — C7800 Secondary malignant neoplasm of unspecified lung: Secondary | ICD-10-CM | POA: Diagnosis not present

## 2022-05-25 LAB — CBC WITH DIFFERENTIAL (CANCER CENTER ONLY)
Abs Immature Granulocytes: 0.11 10*3/uL — ABNORMAL HIGH (ref 0.00–0.07)
Basophils Absolute: 0.1 10*3/uL (ref 0.0–0.1)
Basophils Relative: 1 %
Eosinophils Absolute: 0 10*3/uL (ref 0.0–0.5)
Eosinophils Relative: 0 %
HCT: 35.9 % — ABNORMAL LOW (ref 39.0–52.0)
Hemoglobin: 12 g/dL — ABNORMAL LOW (ref 13.0–17.0)
Immature Granulocytes: 1 %
Lymphocytes Relative: 15 %
Lymphs Abs: 1.2 10*3/uL (ref 0.7–4.0)
MCH: 32.5 pg (ref 26.0–34.0)
MCHC: 33.4 g/dL (ref 30.0–36.0)
MCV: 97.3 fL (ref 80.0–100.0)
Monocytes Absolute: 0.8 10*3/uL (ref 0.1–1.0)
Monocytes Relative: 10 %
Neutro Abs: 5.8 10*3/uL (ref 1.7–7.7)
Neutrophils Relative %: 73 %
Platelet Count: 142 10*3/uL — ABNORMAL LOW (ref 150–400)
RBC: 3.69 MIL/uL — ABNORMAL LOW (ref 4.22–5.81)
RDW: 18.5 % — ABNORMAL HIGH (ref 11.5–15.5)
WBC Count: 7.9 10*3/uL (ref 4.0–10.5)
nRBC: 0 % (ref 0.0–0.2)

## 2022-05-25 LAB — CMP (CANCER CENTER ONLY)
ALT: 24 U/L (ref 0–44)
AST: 15 U/L (ref 15–41)
Albumin: 4.3 g/dL (ref 3.5–5.0)
Alkaline Phosphatase: 61 U/L (ref 38–126)
Anion gap: 8 (ref 5–15)
BUN: 20 mg/dL (ref 6–20)
CO2: 26 mmol/L (ref 22–32)
Calcium: 9 mg/dL (ref 8.9–10.3)
Chloride: 104 mmol/L (ref 98–111)
Creatinine: 1.1 mg/dL (ref 0.61–1.24)
GFR, Estimated: >60 mL/min (ref >60)
Glucose, Bld: 109 mg/dL — ABNORMAL HIGH (ref 70–99)
Potassium: 3.6 mmol/L (ref 3.5–5.1)
Sodium: 138 mmol/L (ref 135–145)
Total Bilirubin: 0.4 mg/dL (ref 0.3–1.2)
Total Protein: 6.8 g/dL (ref 6.5–8.1)

## 2022-05-25 LAB — TSH: TSH: 1.632 u[IU]/mL (ref 0.350–4.500)

## 2022-05-25 LAB — LACTATE DEHYDROGENASE: LDH: 271 U/L — ABNORMAL HIGH (ref 98–192)

## 2022-05-25 MED ORDER — SODIUM CHLORIDE 0.9% FLUSH
10.0000 mL | INTRAVENOUS | Status: DC | PRN
  Administered 2022-05-25: 10 mL

## 2022-05-25 MED ORDER — HEPARIN SOD (PORK) LOCK FLUSH 100 UNIT/ML IV SOLN
500.0000 [IU] | Freq: Once | INTRAVENOUS | Status: AC | PRN
  Administered 2022-05-25: 500 [IU]

## 2022-05-25 MED ORDER — SODIUM CHLORIDE 0.9 % IV SOLN: Freq: Once | INTRAVENOUS | Status: AC

## 2022-05-25 MED ORDER — SODIUM CHLORIDE 0.9 % IV SOLN
360.0000 mg | Freq: Once | INTRAVENOUS | Status: AC
  Administered 2022-05-25: 360 mg via INTRAVENOUS
  Filled 2022-05-25: qty 24

## 2022-05-25 NOTE — Progress Notes (Signed)
Hematology and Oncology Follow Up Visit  Kevin Hunter [id number] 1966/01/26 56 y.o. 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  He has been seeing [name] of Neuro-oncology.  [name] recently saw him.  He did not think that the changes in the last MRI were suggestive of recurrent/progressive tumor.  He thought it may have been radio-inflammatory changes.  [name] has been on a steroid taper.  This really has helped him out.  He is on 0.5 mg daily of Decadron.  I think he is to goes back to see [name] in a couple weeks for another MRI.  He has been doing okay.  We have been holding the Opdivo.  We will go ahead and get the Opdivo started again.  He has had no problems with cough or shortness of breath.  His appetite has been quite good with the Decadron.  He has had no change in bowel or bladder habits.  He has had no rashes.  He had a little bit of leg swelling.  There has been no bleeding.  Overall, I would say his performance status is probably ECOG 0.    Medications:  Allergies as of 05/25/2022       Reactions   Paclitaxel Anaphylaxis   Penicillins Other (See Comments)   Unknown Reaction when he was an infant.        Medication List        Accurate as of May 25, 2022 10:34 AM. If you have any questions, ask your nurse or doctor.          STOP taking these medications    tolterodine 4 MG 24 hr capsule Commonly known as: DETROL LA Stopped by:  [name], MD       TAKE these medications    amLODipine 5 MG tablet Commonly known as: NORVASC Take 5 mg by mouth at bedtime.   clonazePAM 1 MG tablet Commonly known as: KLONOPIN Take 1 mg by mouth 2 (two) times daily as needed for anxiety (May take additional 0.5mg  as needed for panic attack).   dexamethasone 2 MG tablet Commonly known as: DECADRON Take 2 tablets (4 mg total) by mouth daily with breakfast for 14 days, THEN 1.5 tablets (3 mg total) daily with breakfast for 14 days, THEN 1 tablet (2 mg total) daily with breakfast for 14 days, THEN 0.5 tablets (1 mg total) daily with breakfast for 14 days. Start taking on: April 12, 2022   fluconazole 100 MG tablet Commonly known as: DIFLUCAN Take 1 tablet (100 mg total) by mouth daily.   ibuprofen 200 MG tablet Commonly known as: ADVIL Take 800 mg by mouth every 6 (six) hours as needed for headache.   pantoprazole 40 MG tablet Commonly known as: Protonix Take 1 tablet (40 mg total) by mouth 2 (two) times daily.   pseudoephedrine 30 MG tablet Commonly known as: SUDAFED Take 60 mg by mouth every 4 (four)  hours as needed (headache).   psyllium 58.6 % packet Commonly known as: METAMUCIL Take 1 packet by mouth daily.   QUEtiapine 400 MG 24 hr tablet Commonly known as: SEROQUEL XR Take 400 mg by mouth at bedtime.   silver sulfADIAZINE 1 % cream Commonly known as: Silvadene Apply 1 application. topically daily.   solifenacin 10 MG tablet Commonly known as: VESICARE Take 10 mg by mouth daily. What changed: Another medication with the same name was removed. Continue taking this medication, and follow the directions you see here. Changed by: [name], MD   traZODone 100 MG tablet Commonly known as: DESYREL Take 200 mg by mouth at bedtime.   venlafaxine 75 MG tablet Commonly known as: EFFEXOR Take 150 mg by mouth at bedtime.        Allergies:  Allergies  Allergen Reactions   Paclitaxel  Anaphylaxis   Penicillins Other (See Comments)    Unknown Reaction when he was an infant.    Past Medical History, Surgical history, Social history, and Family History were reviewed and updated.  Review of Systems: Review of Systems  Constitutional: Negative.   HENT: Negative.    Eyes: Negative.   Respiratory: Negative.    Cardiovascular: Negative.   Gastrointestinal: Negative.   Genitourinary: Negative.   Musculoskeletal: Negative.   Skin: Negative.   Neurological: Negative.   Endo/Heme/Allergies: Negative.   Psychiatric/Behavioral: Negative.       Physical Exam:  weight is 315 lb (142.9 kg) (abnormal). His oral temperature is 98.2 F (36.8 C). His blood pressure is 132/83 and his pulse is 74. His oxygen saturation is 95%.   Wt Readings from Last 3 Encounters:  05/25/22 (!) 315 lb (142.9 kg)  04/12/22 290 lb 6.4 oz (131.7 kg)  04/07/22 291 lb (132 kg)    Physical Exam Vitals reviewed.  HENT:     Head: Normocephalic and atraumatic.  Eyes:     Pupils: Pupils are equal, round, and reactive to light.  Cardiovascular:     Rate and Rhythm: Normal rate and regular rhythm.     Heart sounds: Normal heart sounds.  Pulmonary:     Effort: Pulmonary effort is normal.     Breath sounds: Normal breath sounds.  Abdominal:     General: Bowel sounds are normal.     Palpations: Abdomen is soft.  Musculoskeletal:        General: No tenderness or deformity. Normal range of motion.     Cervical back: Normal range of motion.  Lymphadenopathy:     Cervical: No cervical adenopathy.  Skin:    General: Skin is warm and dry.     Findings: No erythema or rash.  Neurological:     Mental Status: He is alert and oriented to person, place, and time.  Psychiatric:        Behavior: Behavior normal.        Thought Content: Thought content normal.        Judgment: Judgment normal.      Lab Results  Component Value Date   WBC 7.9 05/25/2022   HGB 12.0 (L) 05/25/2022   HCT 35.9 (L)  05/25/2022   MCV 97.3 05/25/2022   PLT 142 (L) 05/25/2022   No results found for: "FERRITIN", "IRON", "TIBC", "UIBC", "IRONPCTSAT" Lab Results  Component Value Date   RBC 3.69 (L) 05/25/2022   No results found for: "KPAFRELGTCHN", "LAMBDASER", "KAPLAMBRATIO" No results found for: "IGGSERUM", "IGA", "IGMSERUM" No results found for: "TOTALPROTELP", "ALBUMINELP", "A1GS", "A2GS", "BETS", "  BETA2SER", "GAMS", "MSPIKE", "SPEI"   Chemistry      Component Value Date/Time   NA 138 05/25/2022 0939   K 3.6 05/25/2022 0939   CL 104 05/25/2022 0939   CO2 26 05/25/2022 0939   BUN 20 05/25/2022 0939   CREATININE 1.10 05/25/2022 0939      Component Value Date/Time   CALCIUM 9.0 05/25/2022 0939   ALKPHOS 61 05/25/2022 0939   AST 15 05/25/2022 0939   ALT 24 05/25/2022 0939   BILITOT 0.4 05/25/2022 0939       Impression and Plan: Mr. 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  He completed his chemotherapy along with immunotherapy.  He had a very nice response.  He now is on maintenance therapy with nivolumab.  We have had to hold the immunotherapy because of the issue with respect to the [place].  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  I think the next MRI will show [place] this.  We really need to do another PET scan on him.  I will see about getting 1 set up in November.  We will plan to get him back in another 3-4 weeks.    [name], MD 10/10/202310:34 AM

## 2022-05-25 NOTE — Patient Instructions (Signed)
[facility]  Discharge Instructions: Thank you for choosing [facility] to provide your oncology and hematology care.   If you have a lab appointment with the [facility], please go directly to the [facility] and check in at the registration area.  Wear comfortable clothing and clothing appropriate for easy access to any Portacath or PICC line.   We strive to give you quality time with your provider. You may need to reschedule your appointment if you arrive late (15 or more minutes).  Arriving late affects you and other patients whose appointments are after yours.  Also, if you miss three or more appointments without notifying the office, you may be dismissed from the clinic at the provider's discretion.      For prescription refill requests, have your pharmacy contact our office and allow 72 hours for refills to be completed.    Today you received the following chemotherapy and/or immunotherapy agents OPdivo      To help prevent nausea and vomiting after your treatment, we encourage you to take your nausea medication as directed.  BELOW ARE SYMPTOMS THAT SHOULD BE REPORTED IMMEDIATELY: *FEVER GREATER THAN 100.4 F (38 C) OR HIGHER *CHILLS OR SWEATING *NAUSEA AND VOMITING THAT IS NOT CONTROLLED WITH YOUR NAUSEA MEDICATION *UNUSUAL SHORTNESS OF BREATH *UNUSUAL BRUISING OR BLEEDING *URINARY PROBLEMS (pain or burning when urinating, or frequent urination) *BOWEL PROBLEMS (unusual diarrhea, constipation, pain near the anus) TENDERNESS IN MOUTH AND THROAT WITH OR WITHOUT PRESENCE OF ULCERS (sore throat, sores in mouth, or a toothache) UNUSUAL RASH, SWELLING OR PAIN  UNUSUAL VAGINAL DISCHARGE OR ITCHING   Items with * indicate a potential emergency and should be followed up as soon as possible or go to the Emergency Department if any problems should occur.  Please show the CHEMOTHERAPY ALERT CARD or IMMUNOTHERAPY ALERT CARD at check-in to the  Emergency Department and triage nurse. Should you have questions after your visit or need to cancel or reschedule your appointment, please contact [facility]  [phone] and follow the prompts.  Office hours are 8:00 a.m. to 4:30 p.m. Monday - Friday. Please note that voicemails left after 4:00 p.m. may not be returned until the following business day.  We are closed weekends and major holidays. You have access to a nurse at all times for urgent questions. Please call the main number to the clinic [phone] and follow the prompts.  For any non-urgent questions, you may also contact your provider using MyChart. We now offer e-Visits for anyone 79 and older to request care online for non-urgent symptoms. For details visit [email or web address].   Also download the MyChart app! Go to the app store, search "MyChart", open the app, select , and log in with your MyChart username and password.  Masks are optional in the cancer centers. If you would like for your care team to wear a mask while they are taking care of you, please let them know. You may have one support person who is at least 55 years old accompany you for your appointments.

## 2022-05-25 NOTE — Patient Instructions (Signed)

## 2022-05-26 LAB — T4: T4, Total: 6.1 ug/dL (ref 4.5–12.0)

## 2022-05-27 ENCOUNTER — Other Ambulatory Visit: Payer: Self-pay

## 2022-05-28 ENCOUNTER — Other Ambulatory Visit: Payer: Self-pay

## 2022-06-08 ENCOUNTER — Telehealth: Payer: Self-pay | Admitting: *Deleted

## 2022-06-08 NOTE — Telephone Encounter (Signed)
Attempted to reach patient to advise he needed to schedule MRI before his visit with Dr Mickeal Skinner.   Left message requesting call back.

## 2022-06-14 ENCOUNTER — Ambulatory Visit: Payer: 59 | Admitting: Internal Medicine

## 2022-06-21 ENCOUNTER — Ambulatory Visit (HOSPITAL_COMMUNITY)
Admission: RE | Admit: 2022-06-21 | Discharge: 2022-06-21 | Disposition: A | Discharge status: Home / Self Care | Payer: 59 | Source: Ambulatory Visit | Attending: Internal Medicine | Admitting: Internal Medicine

## 2022-06-21 DIAGNOSIS — C3490 Malignant neoplasm of unspecified part of unspecified bronchus or lung: Secondary | ICD-10-CM | POA: Diagnosis present

## 2022-06-21 DIAGNOSIS — C7931 Secondary malignant neoplasm of brain: Secondary | ICD-10-CM | POA: Diagnosis present

## 2022-06-21 MED ORDER — GADOBUTROL 1 MMOL/ML IV SOLN
10.0000 mL | Freq: Once | INTRAVENOUS | Status: AC | PRN
  Administered 2022-06-21: 10 mL via INTRAVENOUS

## 2022-06-23 ENCOUNTER — Encounter (HOSPITAL_COMMUNITY)
Admission: RE | Admit: 2022-06-23 | Discharge: 2022-06-23 | Disposition: A | Discharge status: Home / Self Care | Payer: 59 | Source: Ambulatory Visit | Attending: Hematology & Oncology | Admitting: Hematology & Oncology

## 2022-06-23 DIAGNOSIS — C3491 Malignant neoplasm of unspecified part of right bronchus or lung: Secondary | ICD-10-CM | POA: Insufficient documentation

## 2022-06-23 LAB — GLUCOSE, CAPILLARY: Glucose-Capillary: 93 mg/dL (ref 70–99)

## 2022-06-23 MED ORDER — FLUDEOXYGLUCOSE F - 18 (FDG) INJECTION
15.4100 | Freq: Once | INTRAVENOUS | Status: AC
  Administered 2022-06-23: 15.41 via INTRAVENOUS

## 2022-06-28 ENCOUNTER — Telehealth: Payer: Self-pay | Admitting: Internal Medicine

## 2022-06-28 ENCOUNTER — Inpatient Hospital Stay: Payer: 59 | Attending: Internal Medicine | Admitting: Internal Medicine

## 2022-06-28 ENCOUNTER — Inpatient Hospital Stay: Payer: 59

## 2022-06-28 VITALS — BP 151/86 | HR 69 | Temp 97.6°F | Resp 18 | Wt 315.4 lb

## 2022-06-28 DIAGNOSIS — H538 Other visual disturbances: Secondary | ICD-10-CM | POA: Diagnosis not present

## 2022-06-28 DIAGNOSIS — R519 Headache, unspecified: Secondary | ICD-10-CM | POA: Diagnosis not present

## 2022-06-28 DIAGNOSIS — Z5112 Encounter for antineoplastic immunotherapy: Secondary | ICD-10-CM | POA: Insufficient documentation

## 2022-06-28 DIAGNOSIS — Z79899 Other long term (current) drug therapy: Secondary | ICD-10-CM | POA: Insufficient documentation

## 2022-06-28 DIAGNOSIS — C3411 Malignant neoplasm of upper lobe, right bronchus or lung: Secondary | ICD-10-CM | POA: Insufficient documentation

## 2022-06-28 DIAGNOSIS — C3490 Malignant neoplasm of unspecified part of unspecified bronchus or lung: Secondary | ICD-10-CM | POA: Diagnosis not present

## 2022-06-28 DIAGNOSIS — C7931 Secondary malignant neoplasm of brain: Secondary | ICD-10-CM | POA: Insufficient documentation

## 2022-06-28 DIAGNOSIS — R200 Anesthesia of skin: Secondary | ICD-10-CM | POA: Diagnosis not present

## 2022-06-28 NOTE — Telephone Encounter (Signed)
Per 11/13 los called and left message for pt about appointments

## 2022-06-28 NOTE — Progress Notes (Signed)
[facility] at [place], [place] [address] [phone]   Interval Evaluation  Date of Service: 06/28/22 Patient Name: Kevin Hunter Patient MRN: [id number] Patient DOB: Apr 11, 1966 Provider: Ventura Sellers, MD  Identifying Statement:  Kevin Hunter is a 56 y.o. male with Non-small cell lung cancer metastatic to brain [facility])   Primary Cancer:  Oncologic History: Oncology History  Lung cancer, primary, with metastasis from lung to other site, right ([place])  05/27/2021 Initial Diagnosis   Lung cancer, primary, with metastasis from lung to other site, right [facility])   05/27/2021 Cancer Staging   Staging form: Lung, AJCC 8th Edition - Clinical stage from 05/27/2021: Stage IV (cT3, cN2, pM1) - Signed by [name], MD on 05/27/2021 Histologic grade (G): G2 Histologic grading system: 4 grade system   06/25/2021 - 07/14/2021 Chemotherapy   Patient is on Treatment Plan : LUNG NSCLC SQUAMOUS Nivolumab + Ipilimumab + Carboplatin + Paclitaxel q42d X 1 cycle / Nivolumab + Ipilimumab q42d     07/22/2021 - 10/16/2021 Chemotherapy   Patient is on Treatment Plan : LUNG Carboplatin / Docetaxel q21d     07/22/2021 - 03/17/2022 Chemotherapy   Patient is on Treatment Plan : LUNG Nivolumab q21d     07/22/2021 -  Chemotherapy   Patient is on Treatment Plan : LUNG Nivolumab (240) q14d      CNS Oncologic History 06/12/21: SRS x3 [facility]haw)  Interval History: Kevin Hunter presents today for follow up after recent MRI brain.  His left sided weakness has improved dramatically, he is now walking without a cane.  No issues with left hand.  Has been off all steroids for the past 4 weeks.  Continues on opdivo with Dr. Marin Olp.    H+P (02/08/22) Patient presents today for follow up after recent neurologic symptoms.  He describes numbness and weakness of his left arm and leg, progressive over several weeks.  He began dragging the left leg, interfering  with ambulation.  Also describes issues with cognition, short term memory.  Morning headaches and blurry vision have accompanied these changes.  Decadron was started late last week, this has led to significantly improved symptom burden.  He feels close to his baseline at this time.  Continues on opdivo with Dr. Marin Olp.    Medications: Current Outpatient Medications on File Prior to Visit  Medication Sig Dispense Refill   amLODipine (NORVASC) 5 MG tablet Take 5 mg by mouth at bedtime.     clonazePAM (KLONOPIN) 1 MG tablet Take 1 mg by mouth 2 (two) times daily as needed for anxiety (May take additional 0.56m as needed for panic attack).     fluconazole (DIFLUCAN) 100 MG tablet Take 1 tablet (100 mg total) by mouth daily. 30 tablet 5   ibuprofen (ADVIL) 200 MG tablet Take 800 mg by mouth every 6 (six) hours as needed for headache.     pantoprazole (PROTONIX) 40 MG tablet Take 1 tablet (40 mg total) by mouth 2 (two) times daily. 60 tablet 6   pseudoephedrine (SUDAFED) 30 MG tablet Take 60 mg by mouth every 4 (four) hours as needed (headache).     psyllium (METAMUCIL) 58.6 % packet Take 1 packet by mouth daily.     QUEtiapine (SEROQUEL XR) 400 MG 24 hr tablet Take 400 mg by mouth at bedtime.     silver sulfADIAZINE (SILVADENE) 1 % cream Apply 1 application. topically daily. 50 g 2   solifenacin (VESICARE)  10 MG tablet Take 10 mg by mouth daily.     traZODone (DESYREL) 100 MG tablet Take 200 mg by mouth at bedtime.     venlafaxine (EFFEXOR) 75 MG tablet Take 150 mg by mouth at bedtime.     [DISCONTINUED] prochlorperazine (COMPAZINE) 10 MG tablet Take 1 tablet (10 mg total) by mouth every 6 (six) hours as needed (Nausea or vomiting). 30 tablet 1   No current facility-administered medications on file prior to visit.    Allergies:  Allergies  Allergen Reactions   Paclitaxel Anaphylaxis   Penicillins Other (See Comments)    Unknown Reaction when he was an infant.   Past Medical History:  Past  Medical History:  Diagnosis Date   Anxiety    Bipolar 1 disorder ([place])    Goals of care, counseling/discussion 05/27/2021   Hypertension    Lung cancer, primary, with metastasis from lung to other site, right ([place]) 05/27/2021   Mood disorder ([place])    Sleep apnea    Past Surgical History:  Past Surgical History:  Procedure Laterality Date   BRONCHIAL BIOPSY  05/22/2021   Procedure: BRONCHIAL BIOPSIES;  Surgeon: Garner Nash, DO;  Location: [facility];  Service: Pulmonary;;   BRONCHIAL BRUSHINGS  05/22/2021   Procedure: BRONCHIAL BRUSHINGS;  Surgeon: Garner Nash, DO;  Location: [facility];  Service: Pulmonary;;   BRONCHIAL NEEDLE ASPIRATION BIOPSY  05/22/2021   Procedure: BRONCHIAL NEEDLE ASPIRATION BIOPSIES;  Surgeon: Garner Nash, DO;  Location: [facility];  Service: Pulmonary;;   IR IMAGING GUIDED PORT INSERTION  06/24/2021   VIDEO BRONCHOSCOPY WITH ENDOBRONCHIAL NAVIGATION N/A 05/22/2021   Procedure: VIDEO BRONCHOSCOPY WITH ENDOBRONCHIAL NAVIGATION;  Surgeon: Garner Nash, DO;  Location: [facility];  Service: Pulmonary;  Laterality: N/A;   VIDEO BRONCHOSCOPY WITH RADIAL ENDOBRONCHIAL ULTRASOUND  05/22/2021   Procedure: RADIAL ENDOBRONCHIAL ULTRASOUND;  Surgeon: Garner Nash, DO;  Location: [facility];  Service: Pulmonary;;   Social History:  Social History   Socioeconomic History   Marital status: Single    Spouse name: Not on file   Number of children: Not on file   Years of education: Not on file   Highest education level: Not on file  Occupational History   Not on file  Tobacco Use   Smoking status: Former    Types: Cigarettes, E-cigarettes    Start date: 05/16/2018    Quit date: 05/16/2018    Years since quitting: 4.1   Smokeless tobacco: Never  Vaping Use   Vaping Use: Never used  Substance and Sexual Activity   Alcohol use: Never   Drug use: Never   Sexual activity: Not on file  Other Topics Concern   Not on file  Social History  Narrative   Not on file   Social Determinants of Health   Financial Resource Strain: Not on file  Food Insecurity: Not on file  Transportation Needs: Not on file  Physical Activity: Not on file  Stress: Not on file  Social Connections: Not on file  Intimate Partner Violence: Not At Risk (05/28/2021)   Humiliation, Afraid, Rape, and Kick questionnaire    Fear of Current or Ex-Partner: No    Emotionally Abused: No    Physically Abused: No    Sexually Abused: No   Family History:  Family History  Problem Relation Age of Onset   Breast cancer Mother    Lung cancer Father     Review of Systems: Constitutional: Doesn't report fevers, chills or abnormal  weight loss Eyes: Doesn't report blurriness of vision Ears, nose, mouth, throat, and face: Doesn't report sore throat Respiratory: Doesn't report cough, dyspnea or wheezes Cardiovascular: Doesn't report palpitation, chest discomfort  Gastrointestinal:  Doesn't report nausea, constipation, diarrhea GU: Doesn't report incontinence Skin: Doesn't report skin rashes Neurological: Per HPI Musculoskeletal: Doesn't report joint pain Behavioral/Psych: Doesn't report anxiety  Physical Exam: Vitals:   06/28/22 1020  BP: (!) 151/86  Pulse: 69  Resp: 18  Temp: 97.6 F (36.4 C)  SpO2: 91%    KPS: 90. General: Alert, cooperative, pleasant, in no acute distress Head: Normal EENT: No conjunctival injection or scleral icterus.  Lungs: Resp effort normal Cardiac: Regular rate Abdomen: Non-distended abdomen Skin: No rashes cyanosis or petechiae. Extremities: No clubbing or edema  Neurologic Exam: Mental Status: Awake, alert, attentive to examiner. Oriented to self and environment. Language is fluent with intact comprehension.  Cranial Nerves: Visual acuity is grossly normal. Visual fields are full. Extra-ocular movements intact. No ptosis. Face is symmetric Motor: Tone and bulk are normal. Power is 5/5 in left arm and leg. Reflexes  are symmetric, no pathologic reflexes present.  Sensory: Intact to light touch Gait: Independent   Labs: I have reviewed the data as listed    Component Value Date/Time   NA 138 05/25/2022 0939   K 3.6 05/25/2022 0939   CL 104 05/25/2022 0939   CO2 26 05/25/2022 0939   GLUCOSE 109 (H) 05/25/2022 0939   BUN 20 05/25/2022 0939   CREATININE 1.10 05/25/2022 0939   CALCIUM 9.0 05/25/2022 0939   PROT 6.8 05/25/2022 0939   ALBUMIN 4.3 05/25/2022 0939   AST 15 05/25/2022 0939   ALT 24 05/25/2022 0939   ALKPHOS 61 05/25/2022 0939   BILITOT 0.4 05/25/2022 0939   GFRNONAA >60 05/25/2022 0939   Lab Results  Component Value Date   WBC 7.9 05/25/2022   NEUTROABS 5.8 05/25/2022   HGB 12.0 (L) 05/25/2022   HCT 35.9 (L) 05/25/2022   MCV 97.3 05/25/2022   PLT 142 (L) 05/25/2022    Imaging:  NM PET Image Restag (PS) Skull Base To Thigh  Result Date: 06/26/2022 CLINICAL DATA:  Subsequent treatment strategy for non-small cell lung cancer. EXAM: NUCLEAR MEDICINE PET SKULL BASE TO THIGH TECHNIQUE: 15.4 mCi F-18 FDG was injected intravenously. Full-ring PET imaging was performed from the skull base to thigh after the radiotracer. CT data was obtained and used for attenuation correction and anatomic localization. Fasting blood glucose: 93 mg/dl COMPARISON:  02/01/2022 FINDINGS: Mediastinal blood pool activity: SUV max 3.0 Liver activity: SUV max NA NECK: No hypermetabolic lymph nodes in the neck. Hypo attenuation in the left parietal lobe is better evaluated on brain MRI 06/21/2022. Incidental CT findings: None. CHEST: Slight decrease in size of the 2.9 cm right apical nodule measured previously, now 2.5 cm on image 14/7. SUV max = 2.1 today compared to 3.4 previously. Smaller nodule just medial to this lesion measured previously at 1.7 cm is stable at 1.7 cm SUV max = 2.1 today compared to 2.7 previously. No hypermetabolic mediastinal or hilar lymphadenopathy. Small nonspecific axillary nodes again  identified, left greater than right with low level tracer accumulation ( SUV max = 2.6. Incidental CT findings: Right Port-A-Cath tip is positioned in the distal SVC. Centrilobular and paraseptal emphysema noted in the lungs. ABDOMEN/PELVIS: No abnormal hypermetabolic activity within the liver, pancreas, adrenal glands, or spleen. No hypermetabolic lymph nodes in the abdomen. Mildly enlarged bilateral pelvic sidewall and groin lymph nodes are  stable in the interval in show low level FDG accumulation. Index right external iliac node measuring 16 mm short axis on 191/4 was 15 mm previously (remeasured). SUV max = 4.9 compared to 3.3 previously. Incidental CT findings: There is mild atherosclerotic calcification of the abdominal aorta without aneurysm. SKELETON: No focal hypermetabolic activity to suggest skeletal metastasis. Areas of skin thickening and hypermetabolism seen previously in the low anterior abdominal wall are similar. Incidental CT findings: None. IMPRESSION: 1. Slight decrease in size and hypermetabolism of the right apical pulmonary nodule. 2. Similar size of the smaller adjacent right upper lobe pulmonary nodule with low level FDG accumulation. 3. Small axillary lymph nodes with low level tracer accumulation are stable in the interval a nonspecific. Continued attention on follow-up recommended. 4. Stable mildly enlarged bilateral pelvic sidewall and groin lymph nodes with low level FDG accumulation. This finding is also nonspecific and may reflect reactive etiology. Continued attention on follow-up recommended. 5. Similar appearance of skin thickening and hypermetabolism in the low anterior abdominal wall. 6. No new sites of hypermetabolic disease on today's study. 7. Aortic Atherosclerosis (ICD10-I70.0) and Emphysema (ICD10-J43.9). Electronically Signed   By: Misty Stanley M.D.   On: 06/26/2022 08:01   MR BRAIN W WO CONTRAST  Result Date: 06/23/2022 CLINICAL DATA:  Non-small-cell lung cancer.  Metastatic disease to brain. Follow-up response to treatment. EXAM: MRI HEAD WITHOUT AND WITH CONTRAST TECHNIQUE: Multiplanar, multiecho pulse sequences of the brain and surrounding structures were obtained without and with intravenous contrast. CONTRAST:  26m GADAVIST GADOBUTROL 1 MMOL/ML IV SOLN COMPARISON:  MRI head with contrast 04/07/2022 FINDINGS: Brain: Enhancing lesion in the high right frontal lobe is slightly smaller now measuring 25 x 27 mm compared with 29 x 29 mm. Improvement in surrounding edema. Progressive susceptibility compatible with interval mild hemorrhage. Left anterior frontal lobe lesion appears larger now measuring 21 x 16 mm. Surrounding edema unchanged. Mild susceptibility has progressed in the interval compatible with interval hemorrhage. Left parietal lesion now measures 27 x 20 mm with mild progression. Surrounding white matter edema is unchanged. Progressive susceptibility compatible with interval hemorrhage. No new lesions identified. Ventricle size normal. No acute infarct. Vascular: Normal arterial flow voids at the skull base. Skull and upper cervical spine: No skeletal lesion identified. Sinuses/Orbits: Paranasal sinuses clear.  Negative orbit Other: None IMPRESSION: 1. Interval enlargement and mild hemorrhage in metastatic deposits in the left frontal and left parietal lobe. Surrounding vasogenic edema unchanged 2. Mild improvement in enhancing lesion in the high right frontal lobe with decreased edema. Mild interval hemorrhage. 3. No new lesion. Electronically Signed   By: CFranchot GalloM.D.   On: 06/23/2022 16:22    CL[facility]ange[facility]inician Interpretation: I have personally reviewed the radiological images as listed.  My interpretation, in the context of the patient's clinical presentation, is likely treatment effect   Assessment/Plan Non-small cell lung cancer metastatic to brain ([facility]  JFranz Dellis clinically stable today, now off corticosteroids.  MRI brain  demonstrates stability or contraction of treated (previously inflamed) right frontal met.  This was the lesion responsible for motor deficits.  We are encouraged by the lack of inflammation off steroids.  Two other treated lesions in the left hemisphere are more inflamed, with increased enhancement.  We also suspect treatment effect here.  We again recommended careful surveillance, with repeat MRI brain in 2 months.    Decadron should remain off, if tolerated.  Dr. EMarin Olpwill con't to follow.  We ask that JChrystie Nose  Hunter return to clinic in 2 months following next brain MRI, or sooner as needed.  All questions were answered. The patient knows to call the clinic with any problems, questions or concerns. No barriers to learning were detected.  The total time spent in the encounter was 30 minutes and more than 50% was on counseling and review of test results   Ventura Sellers, MD Medical Director of Neuro-Oncology [facility] at [place] 06/28/22 10:43 AM

## 2022-06-29 ENCOUNTER — Encounter: Payer: Self-pay | Admitting: Hematology & Oncology

## 2022-06-29 ENCOUNTER — Inpatient Hospital Stay: Payer: 59

## 2022-06-29 ENCOUNTER — Other Ambulatory Visit: Payer: Self-pay

## 2022-06-29 ENCOUNTER — Inpatient Hospital Stay: Payer: 59 | Admitting: Hematology & Oncology

## 2022-06-29 ENCOUNTER — Other Ambulatory Visit: Payer: Self-pay | Admitting: Radiation Therapy

## 2022-06-29 VITALS — BP 130/75 | HR 66 | Temp 97.7°F | Resp 18 | Ht 70.0 in | Wt 311.0 lb

## 2022-06-29 VITALS — BP 138/77 | HR 70

## 2022-06-29 DIAGNOSIS — C3491 Malignant neoplasm of unspecified part of right bronchus or lung: Secondary | ICD-10-CM

## 2022-06-29 DIAGNOSIS — F419 Anxiety disorder, unspecified: Secondary | ICD-10-CM | POA: Diagnosis present

## 2022-06-29 DIAGNOSIS — Z5112 Encounter for antineoplastic immunotherapy: Secondary | ICD-10-CM | POA: Diagnosis not present

## 2022-06-29 LAB — CBC WITH DIFFERENTIAL (CANCER CENTER ONLY)
Abs Immature Granulocytes: 0.02 10*3/uL (ref 0.00–0.07)
Basophils Absolute: 0 10*3/uL (ref 0.0–0.1)
Basophils Relative: 1 %
Eosinophils Absolute: 0.1 10*3/uL (ref 0.0–0.5)
Eosinophils Relative: 2 %
HCT: 39.4 % (ref 39.0–52.0)
Hemoglobin: 13.1 g/dL (ref 13.0–17.0)
Immature Granulocytes: 0 %
Lymphocytes Relative: 17 %
Lymphs Abs: 0.9 10*3/uL (ref 0.7–4.0)
MCH: 32 pg (ref 26.0–34.0)
MCHC: 33.2 g/dL (ref 30.0–36.0)
MCV: 96.1 fL (ref 80.0–100.0)
Monocytes Absolute: 0.5 10*3/uL (ref 0.1–1.0)
Monocytes Relative: 10 %
Neutro Abs: 3.8 10*3/uL (ref 1.7–7.7)
Neutrophils Relative %: 70 %
Platelet Count: 172 10*3/uL (ref 150–400)
RBC: 4.1 MIL/uL — ABNORMAL LOW (ref 4.22–5.81)
RDW: 15.3 % (ref 11.5–15.5)
WBC Count: 5.4 10*3/uL (ref 4.0–10.5)
nRBC: 0 % (ref 0.0–0.2)

## 2022-06-29 LAB — CMP (CANCER CENTER ONLY)
ALT: 44 U/L (ref 0–44)
AST: 25 U/L (ref 15–41)
Albumin: 4.5 g/dL (ref 3.5–5.0)
Alkaline Phosphatase: 68 U/L (ref 38–126)
Anion gap: 9 (ref 5–15)
BUN: 9 mg/dL (ref 6–20)
CO2: 27 mmol/L (ref 22–32)
Calcium: 9.1 mg/dL (ref 8.9–10.3)
Chloride: 104 mmol/L (ref 98–111)
Creatinine: 1.05 mg/dL (ref 0.61–1.24)
GFR, Estimated: >60 mL/min (ref >60)
Glucose, Bld: 92 mg/dL (ref 70–99)
Potassium: 3.2 mmol/L — ABNORMAL LOW (ref 3.5–5.1)
Sodium: 140 mmol/L (ref 135–145)
Total Bilirubin: 0.5 mg/dL (ref 0.3–1.2)
Total Protein: 7.1 g/dL (ref 6.5–8.1)

## 2022-06-29 LAB — LACTATE DEHYDROGENASE: LDH: 220 U/L — ABNORMAL HIGH (ref 98–192)

## 2022-06-29 LAB — TSH: TSH: 1.79 u[IU]/mL (ref 0.350–4.500)

## 2022-06-29 MED ORDER — SODIUM CHLORIDE 0.9 % IV SOLN
360.0000 mg | Freq: Once | INTRAVENOUS | Status: AC
  Administered 2022-06-29: 360 mg via INTRAVENOUS
  Filled 2022-06-29: qty 24

## 2022-06-29 MED ORDER — HEPARIN SOD (PORK) LOCK FLUSH 100 UNIT/ML IV SOLN
500.0000 [IU] | Freq: Once | INTRAVENOUS | Status: AC | PRN
  Administered 2022-06-29: 500 [IU]

## 2022-06-29 MED ORDER — SODIUM CHLORIDE 0.9% FLUSH
10.0000 mL | Freq: Once | INTRAVENOUS | Status: AC
  Administered 2022-06-29: 10 mL

## 2022-06-29 MED ORDER — SODIUM CHLORIDE 0.9 % IV SOLN: Freq: Once | INTRAVENOUS | Status: AC

## 2022-06-29 MED ORDER — CYCLOBENZAPRINE HCL 10 MG PO TABS: 10.0000 mg | ORAL_TABLET | Freq: Three times a day (TID) | ORAL | 1 refills | Status: DC | PRN

## 2022-06-29 MED ORDER — SODIUM CHLORIDE 0.9% FLUSH
10.0000 mL | INTRAVENOUS | Status: DC | PRN
  Administered 2022-06-29: 10 mL

## 2022-06-29 NOTE — Patient Instructions (Signed)

## 2022-06-29 NOTE — Patient Instructions (Signed)
[facility] [place]  Discharge Instructions: Thank you for choosing [facility] to provide your oncology and hematology care.   If you have a lab appointment with the [facility], please go directly to the [facility] and check in at the registration area.  Wear comfortable clothing and clothing appropriate for easy access to any Portacath or PICC line.   We strive to give you quality time with your provider. You may need to reschedule your appointment if you arrive late (15 or more minutes).  Arriving late affects you and other patients whose appointments are after yours.  Also, if you miss three or more appointments without notifying the office, you may be dismissed from the clinic at the provider's discretion.      For prescription refill requests, have your pharmacy contact our office and allow 72 hours for refills to be completed.    Today you received the following chemotherapy and/or immunotherapy agents Nivolumab      To help prevent nausea and vomiting after your treatment, we encourage you to take your nausea medication as directed.  BELOW ARE SYMPTOMS THAT SHOULD BE REPORTED IMMEDIATELY: *FEVER GREATER THAN 100.4 F (38 C) OR HIGHER *CHILLS OR SWEATING *NAUSEA AND VOMITING THAT IS NOT CONTROLLED WITH YOUR NAUSEA MEDICATION *UNUSUAL SHORTNESS OF BREATH *UNUSUAL BRUISING OR BLEEDING *URINARY PROBLEMS (pain or burning when urinating, or frequent urination) *BOWEL PROBLEMS (unusual diarrhea, constipation, pain near the anus) TENDERNESS IN MOUTH AND THROAT WITH OR WITHOUT PRESENCE OF ULCERS (sore throat, sores in mouth, or a toothache) UNUSUAL RASH, SWELLING OR PAIN  UNUSUAL VAGINAL DISCHARGE OR ITCHING   Items with * indicate a potential emergency and should be followed up as soon as possible or go to the Emergency Department if any problems should occur.  Please show the CHEMOTHERAPY ALERT CARD or IMMUNOTHERAPY ALERT CARD at check-in to the  Emergency Department and triage nurse. Should you have questions after your visit or need to cancel or reschedule your appointment, please contact [facility]  [phone] and follow the prompts.  Office hours are 8:00 a.m. to 4:30 p.m. Monday - Friday. Please note that voicemails left after 4:00 p.m. may not be returned until the following business day.  We are closed weekends and major holidays. You have access to a nurse at all times for urgent questions. Please call the main number to the clinic [phone] and follow the prompts.  For any non-urgent questions, you may also contact your provider using MyChart. We now offer e-Visits for anyone 74 and older to request care online for non-urgent symptoms. For details visit [email or web address].   Also download the MyChart app! Go to the app store, search "MyChart", open the app, select [facility], and log in with your MyChart username and password.  Masks are optional in the cancer centers. If you would like for your care team to wear a mask while they are taking care of you, please let them know. You may have one support person who is at least 55 years old accompany you for your appointments.

## 2022-06-29 NOTE — Progress Notes (Signed)
Hematology and Oncology Follow Up Visit  Kevin Hunter [id number] 1965-10-31 56 y.o. 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 Crace is here today for follow-up.looks great.  He has been feeling well.  He really has tolerated treatment nicely.  We did do a PET scan on him.  This was done on 06/23/2022.  He has responded.  He has a decrease in the right pulmonary nodule.  He has lymphadenopathy which also has very low level which is nonspecific.  I am just happy that he is doing well.  He saw [name] yesterday.  [name] is very pleased with how well things are going with his brain.  He has had no problems with cough or shortness of breath.  He has had no issues with pain.  The pain that he had is in the right upper thigh.  Is hard to say what is going on.  This been going on for about a month.  He says it is hard for him to raise his leg.  I will know if there is any type of strain.  For right now, we will put him on a muscle relaxer.  If this does not help, then we will see about utilizing a CT scan to see what might be going on.  He has had no problems with bowels or bladder.  He has had no leg swelling.  He has had no rashes.  There has been no bleeding.  He has had no headache.  Overall, I would say his performance status is probably ECOG 0.   Medications:  Allergies as of 06/29/2022       Reactions   Paclitaxel Anaphylaxis   Penicillins Other (See Comments)   Unknown Reaction when he was an infant.        Medication List         Accurate as of June 29, 2022 12:01 PM. If you have any questions, ask your nurse or doctor.          amLODipine 5 MG tablet Commonly known as: NORVASC Take 5 mg by mouth at bedtime.   clonazePAM 1 MG tablet Commonly known as: KLONOPIN Take 1 mg by mouth 2 (two) times daily as needed for anxiety (May take additional 0.5mg  as needed for panic attack).   fluconazole 100 MG tablet Commonly known as: DIFLUCAN Take 1 tablet (100 mg total) by mouth daily.   ibuprofen 200 MG tablet Commonly known as: ADVIL Take 800 mg by mouth every 6 (six) hours as needed for headache.   pantoprazole 40 MG tablet Commonly known as: Protonix Take 1 tablet (40 mg total) by mouth 2 (two) times daily.   pseudoephedrine 30 MG tablet Commonly known as: SUDAFED Take 60 mg by mouth every 4 (four) hours as needed (headache).   psyllium 58.6 % packet Commonly known as: METAMUCIL Take 1 packet by mouth daily.   QUEtiapine 400 MG 24 hr tablet Commonly known as: SEROQUEL XR Take 400 mg by mouth at bedtime.  silver sulfADIAZINE 1 % cream Commonly known as: Silvadene Apply 1 application. topically daily.   solifenacin 10 MG tablet Commonly known as: VESICARE Take 10 mg by mouth daily.   traZODone 100 MG tablet Commonly known as: DESYREL Take 200 mg by mouth at bedtime.   venlafaxine 75 MG tablet Commonly known as: EFFEXOR Take 150 mg by mouth at bedtime.        Allergies:  Allergies  Allergen Reactions   Paclitaxel Anaphylaxis   Penicillins Other (See Comments)    Unknown Reaction when he was an infant.    Past Medical History, Surgical history, Social history, and Family History were reviewed and updated.  Review of Systems: Review of Systems  Constitutional: Negative.   HENT: Negative.    Eyes: Negative.   Respiratory: Negative.    Cardiovascular: Negative.   Gastrointestinal: Negative.   Genitourinary: Negative.   Musculoskeletal: Negative.   Skin: Negative.    Neurological: Negative.   Endo/Heme/Allergies: Negative.   Psychiatric/Behavioral: Negative.       Physical Exam:  height is 5\' 10"  (1.778 m) and weight is 311 lb (141.1 kg) (abnormal). His oral temperature is 97.7 F (36.5 C). His blood pressure is 130/75 and his pulse is 66. His respiration is 18 and oxygen saturation is 100%.   Wt Readings from Last 3 Encounters:  06/29/22 (!) 311 lb (141.1 kg)  06/28/22 (!) 315 lb 6 oz (143.1 kg)  05/25/22 (!) 315 lb (142.9 kg)    Physical Exam Vitals reviewed.  HENT:     Head: Normocephalic and atraumatic.  Eyes:     Pupils: Pupils are equal, round, and reactive to light.  Cardiovascular:     Rate and Rhythm: Normal rate and regular rhythm.     Heart sounds: Normal heart sounds.  Pulmonary:     Effort: Pulmonary effort is normal.     Breath sounds: Normal breath sounds.  Abdominal:     General: Bowel sounds are normal.     Palpations: Abdomen is soft.  Musculoskeletal:        General: No tenderness or deformity. Normal range of motion.     Cervical back: Normal range of motion.  Lymphadenopathy:     Cervical: No cervical adenopathy.  Skin:    General: Skin is warm and dry.     Findings: No erythema or rash.  Neurological:     Mental Status: He is alert and oriented to person, place, and time.  Psychiatric:        Behavior: Behavior normal.        Thought Content: Thought content normal.        Judgment: Judgment normal.      Lab Results  Component Value Date   WBC 5.4 06/29/2022   HGB 13.1 06/29/2022   HCT 39.4 06/29/2022   MCV 96.1 06/29/2022   PLT 172 06/29/2022   No results found for: "FERRITIN", "IRON", "TIBC", "UIBC", "IRONPCTSAT" Lab Results  Component Value Date   RBC 4.10 (L) 06/29/2022   No results found for: "KPAFRELGTCHN", "LAMBDASER", "KAPLAMBRATIO" No results found for: "IGGSERUM", "IGA", "IGMSERUM" No results found for: "TOTALPROTELP", "ALBUMINELP", "A1GS", "A2GS", "BETS", "BETA2SER", "GAMS",  "MSPIKE", "SPEI"   Chemistry      Component Value Date/Time   NA 140 06/29/2022 0850   K 3.2 (L) 06/29/2022 0850   CL 104 06/29/2022 0850   CO2 27 06/29/2022 0850   BUN 9 06/29/2022 0850   CREATININE 1.05 06/29/2022 0850      Component Value  Date/Time   CALCIUM 9.1 06/29/2022 0850   ALKPHOS 68 06/29/2022 0850   AST 25 06/29/2022 0850   ALT 44 06/29/2022 0850   BILITOT 0.5 06/29/2022 0850       Impression and Plan: Mr. 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  He completed his chemotherapy along with immunotherapy.  He had a very nice response.  He now is on maintenance therapy with nivolumab.  Because he is doing well, I think we can probably move him out to every 4 months.  We will see about increasing the nivolumab to 480 mg.  I am just happy that his quality of life is doing well.  I am happy that the brain mets are controlled.  We will plan to get him back in 1 month.  He will let us know if his right thigh does not get better.  If not, then we will do a CT scan.   [name], MD 11/14/202312:01 PM

## 2022-06-30 ENCOUNTER — Other Ambulatory Visit: Payer: Self-pay

## 2022-07-27 ENCOUNTER — Inpatient Hospital Stay: Payer: 59

## 2022-07-27 ENCOUNTER — Inpatient Hospital Stay: Payer: 59 | Attending: Internal Medicine

## 2022-07-27 ENCOUNTER — Encounter: Payer: Self-pay | Admitting: Hematology & Oncology

## 2022-07-27 ENCOUNTER — Inpatient Hospital Stay (HOSPITAL_BASED_OUTPATIENT_CLINIC_OR_DEPARTMENT_OTHER): Payer: 59 | Admitting: Hematology & Oncology

## 2022-07-27 VITALS — BP 121/71 | HR 67 | Temp 97.9°F | Resp 18 | Ht 70.0 in | Wt 309.0 lb

## 2022-07-27 DIAGNOSIS — C3491 Malignant neoplasm of unspecified part of right bronchus or lung: Secondary | ICD-10-CM

## 2022-07-27 DIAGNOSIS — C7931 Secondary malignant neoplasm of brain: Secondary | ICD-10-CM | POA: Insufficient documentation

## 2022-07-27 DIAGNOSIS — C7800 Secondary malignant neoplasm of unspecified lung: Secondary | ICD-10-CM | POA: Insufficient documentation

## 2022-07-27 DIAGNOSIS — C3411 Malignant neoplasm of upper lobe, right bronchus or lung: Secondary | ICD-10-CM | POA: Diagnosis present

## 2022-07-27 DIAGNOSIS — Z5112 Encounter for antineoplastic immunotherapy: Secondary | ICD-10-CM | POA: Diagnosis present

## 2022-07-27 LAB — CBC WITH DIFFERENTIAL (CANCER CENTER ONLY)
Abs Immature Granulocytes: 0.03 10*3/uL (ref 0.00–0.07)
Basophils Absolute: 0 10*3/uL (ref 0.0–0.1)
Basophils Relative: 1 %
Eosinophils Absolute: 0.1 10*3/uL (ref 0.0–0.5)
Eosinophils Relative: 1 %
HCT: 40.7 % (ref 39.0–52.0)
Hemoglobin: 13.6 g/dL (ref 13.0–17.0)
Immature Granulocytes: 1 %
Lymphocytes Relative: 18 %
Lymphs Abs: 1.1 10*3/uL (ref 0.7–4.0)
MCH: 31.2 pg (ref 26.0–34.0)
MCHC: 33.4 g/dL (ref 30.0–36.0)
MCV: 93.3 fL (ref 80.0–100.0)
Monocytes Absolute: 0.7 10*3/uL (ref 0.1–1.0)
Monocytes Relative: 11 %
Neutro Abs: 4.2 10*3/uL (ref 1.7–7.7)
Neutrophils Relative %: 68 %
Platelet Count: 158 10*3/uL (ref 150–400)
RBC: 4.36 MIL/uL (ref 4.22–5.81)
RDW: 13.4 % (ref 11.5–15.5)
WBC Count: 6 10*3/uL (ref 4.0–10.5)
nRBC: 0 % (ref 0.0–0.2)

## 2022-07-27 LAB — CMP (CANCER CENTER ONLY)
ALT: 27 U/L (ref 0–44)
AST: 21 U/L (ref 15–41)
Albumin: 4.6 g/dL (ref 3.5–5.0)
Alkaline Phosphatase: 68 U/L (ref 38–126)
Anion gap: 9 (ref 5–15)
BUN: 12 mg/dL (ref 6–20)
CO2: 27 mmol/L (ref 22–32)
Calcium: 8.7 mg/dL — ABNORMAL LOW (ref 8.9–10.3)
Chloride: 103 mmol/L (ref 98–111)
Creatinine: 1.15 mg/dL (ref 0.61–1.24)
GFR, Estimated: >60 mL/min (ref >60)
Glucose, Bld: 90 mg/dL (ref 70–99)
Potassium: 3.3 mmol/L — ABNORMAL LOW (ref 3.5–5.1)
Sodium: 139 mmol/L (ref 135–145)
Total Bilirubin: 0.4 mg/dL (ref 0.3–1.2)
Total Protein: 7 g/dL (ref 6.5–8.1)

## 2022-07-27 LAB — LACTATE DEHYDROGENASE: LDH: 183 U/L (ref 98–192)

## 2022-07-27 MED ORDER — SODIUM CHLORIDE 0.9 % IV SOLN: Freq: Once | INTRAVENOUS | Status: AC

## 2022-07-27 MED ORDER — HEPARIN SOD (PORK) LOCK FLUSH 100 UNIT/ML IV SOLN
500.0000 [IU] | Freq: Once | INTRAVENOUS | Status: AC | PRN
  Administered 2022-07-27: 500 [IU]

## 2022-07-27 MED ORDER — SODIUM CHLORIDE 0.9 % IV SOLN
480.0000 mg | Freq: Once | INTRAVENOUS | Status: AC
  Administered 2022-07-27: 480 mg via INTRAVENOUS
  Filled 2022-07-27: qty 48

## 2022-07-27 MED ORDER — SODIUM CHLORIDE 0.9% FLUSH
10.0000 mL | INTRAVENOUS | Status: DC | PRN
  Administered 2022-07-27: 10 mL

## 2022-07-27 NOTE — Patient Instructions (Signed)

## 2022-07-27 NOTE — Addendum Note (Signed)
Addended by: Burney Gauze R on: 07/27/2022 11:52 AM   Modules accepted: Orders

## 2022-07-27 NOTE — Patient Instructions (Signed)
[facility]  Discharge Instructions: Thank you for choosing [facility] to provide your oncology and hematology care.   If you have a lab appointment with the [facility], please go directly to the [facility] and check in at the registration area.  Wear comfortable clothing and clothing appropriate for easy access to any Portacath or PICC line.   We strive to give you quality time with your provider. You may need to reschedule your appointment if you arrive late (15 or more minutes).  Arriving late affects you and other patients whose appointments are after yours.  Also, if you miss three or more appointments without notifying the office, you may be dismissed from the clinic at the provider's discretion.      For prescription refill requests, have your pharmacy contact our office and allow 72 hours for refills to be completed.    Today you received the following chemotherapy and/or immunotherapy agents Opdivo.   To help prevent nausea and vomiting after your treatment, we encourage you to take your nausea medication as directed.  BELOW ARE SYMPTOMS THAT SHOULD BE REPORTED IMMEDIATELY: *FEVER GREATER THAN 100.4 F (38 C) OR HIGHER *CHILLS OR SWEATING *NAUSEA AND VOMITING THAT IS NOT CONTROLLED WITH YOUR NAUSEA MEDICATION *UNUSUAL SHORTNESS OF BREATH *UNUSUAL BRUISING OR BLEEDING *URINARY PROBLEMS (pain or burning when urinating, or frequent urination) *BOWEL PROBLEMS (unusual diarrhea, constipation, pain near the anus) TENDERNESS IN MOUTH AND THROAT WITH OR WITHOUT PRESENCE OF ULCERS (sore throat, sores in mouth, or a toothache) UNUSUAL RASH, SWELLING OR PAIN  UNUSUAL VAGINAL DISCHARGE OR ITCHING   Items with * indicate a potential emergency and should be followed up as soon as possible or go to the Emergency Department if any problems should occur.  Please show the CHEMOTHERAPY ALERT CARD or IMMUNOTHERAPY ALERT CARD at check-in to the  Emergency Department and triage nurse. Should you have questions after your visit or need to cancel or reschedule your appointment, please contact [facility]  [phone] and follow the prompts.  Office hours are 8:00 a.m. to 4:30 p.m. Monday - Friday. Please note that voicemails left after 4:00 p.m. may not be returned until the following business day.  We are closed weekends and major holidays. You have access to a nurse at all times for urgent questions. Please call the main number to the clinic [phone] and follow the prompts.  For any non-urgent questions, you may also contact your provider using MyChart. We now offer e-Visits for anyone 51 and older to request care online for non-urgent symptoms. For details visit [email or web address].   Also download the MyChart app! Go to the app store, search "MyChart", open the app, select [facility], and log in with your MyChart username and password.  Masks are optional in the cancer centers. If you would like for your care team to wear a mask while they are taking care of you, please let them know. You may have one support person who is at least 55 years old accompany you for your appointments.

## 2022-07-27 NOTE — Progress Notes (Signed)
Hematology and Oncology Follow Up Visit  Kevin Hunter [id number] 06/08/1966 56 y.o. 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  He has been feeling well.  He sees [name] in January.  He will have a another MRI of the brain a week before he sees him.  He has had no problems with pain.  He does have little bit of discomfort in the right thumb.  He says Tylenol works best for him.  He has had no problems with nausea or vomiting.  There has been no diarrhea.  His last TSH in November was 1.79.  He has had no rashes.  There is been no headache.  He has had no visual changes.  There is been no cough or shortness of breath.  He has had no bleeding.  His appetite has been good.  Had a wonderful Thanksgiving.  His calcium is low but on the lower side.  I told him to take vitamin D 2000 units a day.  Overall, I would say that his performance status is probably ECOG 1.    Medications:  Allergies as of 07/27/2022       Reactions   Paclitaxel Anaphylaxis   Penicillins Other (See Comments)   Unknown Reaction when he was an infant.        Medication List        Accurate as of July 27, 2022 11:36 AM. If you have any questions, ask your nurse or doctor.          STOP taking these medications    silver sulfADIAZINE 1 % cream Commonly known as: Silvadene Stopped by: [name], MD       TAKE these medications    amLODipine 5 MG tablet Commonly known as: NORVASC Take 5 mg by  mouth at bedtime.   clonazePAM 1 MG tablet Commonly known as: KLONOPIN Take 1 mg by mouth 2 (two) times daily as needed for anxiety (May take additional 0.5mg  as needed for panic attack).   cyclobenzaprine 10 MG tablet Commonly known as: FLEXERIL Take 1 tablet (10 mg total) by mouth 3 (three) times daily as needed for muscle spasms.   fluconazole 100 MG tablet Commonly known as: DIFLUCAN Take 1 tablet (100 mg total) by mouth daily.   ibuprofen 200 MG tablet Commonly known as: ADVIL Take 800 mg by mouth every 6 (six) hours as needed for headache.   pantoprazole 40 MG tablet Commonly known as: Protonix Take 1 tablet (40 mg total) by mouth 2 (two) times daily.   pseudoephedrine 30 MG tablet Commonly known as: SUDAFED Take 60 mg by mouth every 4 (four) hours as needed (headache).   psyllium 58.6 % packet Commonly known as: METAMUCIL Take 1 packet by mouth daily.   QUEtiapine 400 MG 24 hr tablet Commonly known as: SEROQUEL XR Take 400 mg by mouth at bedtime.   solifenacin 10 MG tablet Commonly known as: VESICARE Take 10 mg by mouth daily.  traZODone 100 MG tablet Commonly known as: DESYREL Take 200 mg by mouth at bedtime.   venlafaxine 75 MG tablet Commonly known as: EFFEXOR Take 150 mg by mouth at bedtime.        Allergies:  Allergies  Allergen Reactions   Paclitaxel Anaphylaxis   Penicillins Other (See Comments)    Unknown Reaction when he was an infant.    Past Medical History, Surgical history, Social history, and Family History were reviewed and updated.  Review of Systems: Review of Systems  Constitutional: Negative.   HENT: Negative.    Eyes: Negative.   Respiratory: Negative.    Cardiovascular: Negative.   Gastrointestinal: Negative.   Genitourinary: Negative.   Musculoskeletal: Negative.   Skin: Negative.   Neurological: Negative.   Endo/Heme/Allergies: Negative.   Psychiatric/Behavioral: Negative.       Physical Exam:  height is 5'  10" (1.778 m) and weight is 309 lb (140.2 kg) (abnormal). His oral temperature is 97.9 F (36.6 C). His blood pressure is 121/71 and his pulse is 67. His respiration is 18.   Wt Readings from Last 3 Encounters:  07/27/22 (!) 309 lb (140.2 kg)  06/29/22 (!) 311 lb (141.1 kg)  06/28/22 (!) 315 lb 6 oz (143.1 kg)    Physical Exam Vitals reviewed.  HENT:     Head: Normocephalic and atraumatic.  Eyes:     Pupils: Pupils are equal, round, and reactive to light.  Cardiovascular:     Rate and Rhythm: Normal rate and regular rhythm.     Heart sounds: Normal heart sounds.  Pulmonary:     Effort: Pulmonary effort is normal.     Breath sounds: Normal breath sounds.  Abdominal:     General: Bowel sounds are normal.     Palpations: Abdomen is soft.  Musculoskeletal:        General: No tenderness or deformity. Normal range of motion.     Cervical back: Normal range of motion.  Lymphadenopathy:     Cervical: No cervical adenopathy.  Skin:    General: Skin is warm and dry.     Findings: No erythema or rash.  Neurological:     Mental Status: He is alert and oriented to person, place, and time.  Psychiatric:        Behavior: Behavior normal.        Thought Content: Thought content normal.        Judgment: Judgment normal.      Lab Results  Component Value Date   WBC 6.0 07/27/2022   HGB 13.6 07/27/2022   HCT 40.7 07/27/2022   MCV 93.3 07/27/2022   PLT 158 07/27/2022   No results found for: "FERRITIN", "IRON", "TIBC", "UIBC", "IRONPCTSAT" Lab Results  Component Value Date   RBC 4.36 07/27/2022   No results found for: "KPAFRELGTCHN", "LAMBDASER", "KAPLAMBRATIO" No results found for: "IGGSERUM", "IGA", "IGMSERUM" No results found for: "TOTALPROTELP", "ALBUMINELP", "A1GS", "A2GS", "BETS", "BETA2SER", "GAMS", "MSPIKE", "SPEI"   Chemistry      Component Value Date/Time   NA 139 07/27/2022 1029   K 3.3 (L) 07/27/2022 1029   CL 103 07/27/2022 1029   CO2 27 07/27/2022 1029   BUN  12 07/27/2022 1029   CREATININE 1.15 07/27/2022 1029      Component Value Date/Time   CALCIUM 8.7 (L) 07/27/2022 1029   ALKPHOS 68 07/27/2022 1029   AST 21 07/27/2022 1029   ALT 27 07/27/2022 1029   BILITOT 0.4 07/27/2022 1029  Impression and Plan: Kevin Hunter is a very pleasant 56 yo caucasian gentleman with 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  He completed his chemotherapy along with immunotherapy.  He had a very nice response.  He now is on maintenance therapy with nivolumab.  I am just happy that his quality life is doing so well right now.  We will plan to get him back in 1 month.  He does not need another PET scan probably until February.  If everything looks good on the PET scan in February, we will then move his treatments to every 6 weeks.  Again, I am just happy that he made it through this year in such good shape.  We have been dealing with his cancer now for over a year and he is done incredibly well.   [name], MD 12/12/202311:36 AM

## 2022-08-18 ENCOUNTER — Encounter: Payer: Self-pay | Admitting: Hematology & Oncology

## 2022-08-19 ENCOUNTER — Encounter: Payer: Self-pay | Admitting: Hematology & Oncology

## 2022-08-21 ENCOUNTER — Encounter: Payer: Self-pay | Admitting: Hematology & Oncology

## 2022-08-22 ENCOUNTER — Other Ambulatory Visit: Payer: Self-pay | Admitting: Hematology & Oncology

## 2022-08-25 ENCOUNTER — Inpatient Hospital Stay: Payer: Managed Care, Other (non HMO) | Attending: Hematology & Oncology

## 2022-08-25 ENCOUNTER — Inpatient Hospital Stay: Payer: Managed Care, Other (non HMO)

## 2022-08-25 ENCOUNTER — Other Ambulatory Visit: Payer: Self-pay

## 2022-08-25 ENCOUNTER — Inpatient Hospital Stay: Payer: Managed Care, Other (non HMO) | Admitting: Family

## 2022-08-25 ENCOUNTER — Encounter: Payer: Self-pay | Admitting: Family

## 2022-08-25 VITALS — BP 114/84 | HR 66 | Temp 97.8°F

## 2022-08-25 VITALS — BP 120/81 | HR 76 | Temp 97.7°F | Resp 20 | Ht 70.0 in | Wt 307.4 lb

## 2022-08-25 DIAGNOSIS — C3490 Malignant neoplasm of unspecified part of unspecified bronchus or lung: Secondary | ICD-10-CM

## 2022-08-25 DIAGNOSIS — C7931 Secondary malignant neoplasm of brain: Secondary | ICD-10-CM | POA: Diagnosis not present

## 2022-08-25 DIAGNOSIS — Z5112 Encounter for antineoplastic immunotherapy: Secondary | ICD-10-CM | POA: Insufficient documentation

## 2022-08-25 DIAGNOSIS — C3491 Malignant neoplasm of unspecified part of right bronchus or lung: Secondary | ICD-10-CM | POA: Diagnosis not present

## 2022-08-25 DIAGNOSIS — Z79899 Other long term (current) drug therapy: Secondary | ICD-10-CM | POA: Diagnosis not present

## 2022-08-25 DIAGNOSIS — E032 Hypothyroidism due to medicaments and other exogenous substances: Secondary | ICD-10-CM

## 2022-08-25 LAB — CBC WITH DIFFERENTIAL (CANCER CENTER ONLY)
Abs Immature Granulocytes: 0.03 10*3/uL (ref 0.00–0.07)
Basophils Absolute: 0 10*3/uL (ref 0.0–0.1)
Basophils Relative: 1 %
Eosinophils Absolute: 0.1 10*3/uL (ref 0.0–0.5)
Eosinophils Relative: 2 %
HCT: 40.9 % (ref 39.0–52.0)
Hemoglobin: 13.9 g/dL (ref 13.0–17.0)
Immature Granulocytes: 1 %
Lymphocytes Relative: 20 %
Lymphs Abs: 1.2 10*3/uL (ref 0.7–4.0)
MCH: 31 pg (ref 26.0–34.0)
MCHC: 34 g/dL (ref 30.0–36.0)
MCV: 91.3 fL (ref 80.0–100.0)
Monocytes Absolute: 0.6 10*3/uL (ref 0.1–1.0)
Monocytes Relative: 10 %
Neutro Abs: 3.9 10*3/uL (ref 1.7–7.7)
Neutrophils Relative %: 66 %
Platelet Count: 157 10*3/uL (ref 150–400)
RBC: 4.48 MIL/uL (ref 4.22–5.81)
RDW: 13.8 % (ref 11.5–15.5)
WBC Count: 5.9 10*3/uL (ref 4.0–10.5)
nRBC: 0 % (ref 0.0–0.2)

## 2022-08-25 LAB — CMP (CANCER CENTER ONLY)
ALT: 23 U/L (ref 0–44)
AST: 18 U/L (ref 15–41)
Albumin: 4 g/dL (ref 3.5–5.0)
Alkaline Phosphatase: 66 U/L (ref 38–126)
Anion gap: 8 (ref 5–15)
BUN: 15 mg/dL (ref 6–20)
CO2: 26 mmol/L (ref 22–32)
Calcium: 8.8 mg/dL — ABNORMAL LOW (ref 8.9–10.3)
Chloride: 105 mmol/L (ref 98–111)
Creatinine: 1.09 mg/dL (ref 0.61–1.24)
GFR, Estimated: >60 mL/min (ref >60)
Glucose, Bld: 88 mg/dL (ref 70–99)
Potassium: 3.6 mmol/L (ref 3.5–5.1)
Sodium: 139 mmol/L (ref 135–145)
Total Bilirubin: 0.3 mg/dL (ref 0.3–1.2)
Total Protein: 7.2 g/dL (ref 6.5–8.1)

## 2022-08-25 LAB — LACTATE DEHYDROGENASE: LDH: 217 U/L — ABNORMAL HIGH (ref 98–192)

## 2022-08-25 LAB — TSH: TSH: 1.475 u[IU]/mL (ref 0.350–4.500)

## 2022-08-25 MED ORDER — SODIUM CHLORIDE 0.9% FLUSH
10.0000 mL | INTRAVENOUS | Status: DC | PRN
  Administered 2022-08-25: 10 mL

## 2022-08-25 MED ORDER — SODIUM CHLORIDE 0.9 % IV SOLN: Freq: Once | INTRAVENOUS | Status: AC

## 2022-08-25 MED ORDER — HEPARIN SOD (PORK) LOCK FLUSH 100 UNIT/ML IV SOLN
500.0000 [IU] | Freq: Once | INTRAVENOUS | Status: AC | PRN
  Administered 2022-08-25: 500 [IU]

## 2022-08-25 MED ORDER — SODIUM CHLORIDE 0.9 % IV SOLN
480.0000 mg | Freq: Once | INTRAVENOUS | Status: AC
  Administered 2022-08-25: 480 mg via INTRAVENOUS
  Filled 2022-08-25: qty 48

## 2022-08-25 NOTE — Progress Notes (Signed)
Hematology and Oncology Follow Up Visit  JSON KOELZER [id number] 05/09/1966 57 y.o. 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 Reich is here today for follow-up and treatment. He is doing well and has no complaints at this time.  He is scheduled for his MRI or the brain with [name] tomorrow and will then see to Dr next week on Monday to discuss.  TSH last month was stable at 1.790.  No fever, chills, n/v, cough, rash, dizziness, SOB, chest pain, palpitations, abdominal pain or changes in bowel or bladder habits.  No blood loss, bruising or petechiae noted.  No swelling, tenderness or numbness in his extremities.  No falls or syncope.  Appetite and hydration are good. Weight is stable at 307 lbs.   ECOG Performance Status: 1 - Symptomatic but completely ambulatory  Medications:  Allergies as of 08/25/2022       Reactions   Paclitaxel Anaphylaxis   Penicillins Other (See Comments)   Unknown Reaction when he was an infant.        Medication List        Accurate as of August 25, 2022 10:37 AM. If you have any questions, ask your nurse or doctor.          amLODipine 5 MG tablet Commonly known as: NORVASC Take 5 mg by mouth at bedtime.   clonazePAM 1 MG tablet Commonly known as: KLONOPIN Take 1 mg by mouth 2 (two) times daily as needed for anxiety (May take additional 0.5mg  as needed for panic attack).   cyclobenzaprine 10 MG tablet Commonly known as: FLEXERIL Take 1 tablet (10 mg total) by mouth 3 (three) times daily as needed for  muscle spasms.   fluconazole 100 MG tablet Commonly known as: DIFLUCAN Take 1 tablet (100 mg total) by mouth daily.   ibuprofen 200 MG tablet Commonly known as: ADVIL Take 800 mg by mouth every 6 (six) hours as needed for headache.   pantoprazole 40 MG tablet Commonly known as: PROTONIX TAKE 1 TABLET BY MOUTH TWICE A DAY   pseudoephedrine 30 MG tablet Commonly known as: SUDAFED Take 60 mg by mouth every 4 (four) hours as needed (headache).   psyllium 58.6 % packet Commonly known as: METAMUCIL Take 1 packet by mouth daily.   QUEtiapine 400 MG 24 hr tablet Commonly known as: SEROQUEL XR Take 400 mg by mouth at bedtime.   solifenacin 10 MG tablet Commonly known as: VESICARE Take 10 mg by mouth daily.   traZODone 100 MG tablet Commonly known as: DESYREL Take 200 mg by mouth at bedtime.   venlafaxine 75 MG tablet Commonly known as: EFFEXOR Take 150 mg by mouth at bedtime.        Allergies:  Allergies  Allergen Reactions   Paclitaxel Anaphylaxis   Penicillins Other (See Comments)    Unknown Reaction when he was an infant.    Past Medical History, Surgical history, Social history, and Family History were reviewed and updated.  Review of Systems: All other 10 point review of  systems is negative.   Physical Exam:  height is 5\' 10"  (1.778 m) and weight is 307 lb 6.4 oz (139.4 kg) (abnormal). His oral temperature is 97.7 F (36.5 C). His blood pressure is 120/81 and his pulse is 76. His respiration is 20 and oxygen saturation is 96%.   Wt Readings from Last 3 Encounters:  08/25/22 (!) 307 lb 6.4 oz (139.4 kg)  07/27/22 (!) 309 lb (140.2 kg)  06/29/22 (!) 311 lb (141.1 kg)    Ocular: Sclerae unicteric, pupils equal, round and reactive to light Ear-nose-throat: Oropharynx clear, dentition fair Lymphatic: No cervical or supraclavicular adenopathy Lungs no rales or rhonchi, good excursion bilaterally Heart regular rate and rhythm, no murmur appreciated Abd soft,  nontender, positive bowel sounds MSK no focal spinal tenderness, no joint edema Neuro: non-focal, well-oriented, appropriate affect Breasts: Deferred   Lab Results  Component Value Date   WBC 5.9 08/25/2022   HGB 13.9 08/25/2022   HCT 40.9 08/25/2022   MCV 91.3 08/25/2022   PLT 157 08/25/2022   No results found for: "FERRITIN", "IRON", "TIBC", "UIBC", "IRONPCTSAT" Lab Results  Component Value Date   RBC 4.48 08/25/2022   No results found for: "KPAFRELGTCHN", "LAMBDASER", "KAPLAMBRATIO" No results found for: "IGGSERUM", "IGA", "IGMSERUM" No results found for: "TOTALPROTELP", "ALBUMINELP", "A1GS", "A2GS", "BETS", "BETA2SER", "GAMS", "MSPIKE", "SPEI"   Chemistry      Component Value Date/Time   NA 139 07/27/2022 1029   K 3.3 (L) 07/27/2022 1029   CL 103 07/27/2022 1029   CO2 27 07/27/2022 1029   BUN 12 07/27/2022 1029   CREATININE 1.15 07/27/2022 1029      Component Value Date/Time   CALCIUM 8.7 (L) 07/27/2022 1029   ALKPHOS 68 07/27/2022 1029   AST 21 07/27/2022 1029   ALT 27 07/27/2022 1029   BILITOT 0.4 07/27/2022 1029       Impression and Plan: Mr. 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 He completed his chemotherapy along with immunotherapy.  He now is on maintenance therapy with nivolumab and tolerating well with a nice response.  MRI or the brain is tomorrow.  We will proceed with treatment today as planned.  We will repeat his PET scan in February.  Follow-up in 4 weeks.    Lottie Dawson, NP 1/10/202410:37 AM

## 2022-08-25 NOTE — Patient Instructions (Signed)
[facility] [place]  Discharge Instructions: Thank you for choosing [facility] to provide your oncology and hematology care.   If you have a lab appointment with the [facility], please go directly to the [facility] and check in at the registration area.  Wear comfortable clothing and clothing appropriate for easy access to any Portacath or PICC line.   We strive to give you quality time with your provider. You may need to reschedule your appointment if you arrive late (15 or more minutes).  Arriving late affects you and other patients whose appointments are after yours.  Also, if you miss three or more appointments without notifying the office, you may be dismissed from the clinic at the provider's discretion.      For prescription refill requests, have your pharmacy contact our office and allow 72 hours for refills to be completed.    Today you received the following chemotherapy and/or immunotherapy agents:  Nivolumab       To help prevent nausea and vomiting after your treatment, we encourage you to take your nausea medication as directed.  BELOW ARE SYMPTOMS THAT SHOULD BE REPORTED IMMEDIATELY: *FEVER GREATER THAN 100.4 F (38 C) OR HIGHER *CHILLS OR SWEATING *NAUSEA AND VOMITING THAT IS NOT CONTROLLED WITH YOUR NAUSEA MEDICATION *UNUSUAL SHORTNESS OF BREATH *UNUSUAL BRUISING OR BLEEDING *URINARY PROBLEMS (pain or burning when urinating, or frequent urination) *BOWEL PROBLEMS (unusual diarrhea, constipation, pain near the anus) TENDERNESS IN MOUTH AND THROAT WITH OR WITHOUT PRESENCE OF ULCERS (sore throat, sores in mouth, or a toothache) UNUSUAL RASH, SWELLING OR PAIN  UNUSUAL VAGINAL DISCHARGE OR ITCHING   Items with * indicate a potential emergency and should be followed up as soon as possible or go to the Emergency Department if any problems should occur.  Please show the CHEMOTHERAPY ALERT CARD or IMMUNOTHERAPY ALERT CARD at check-in to  the Emergency Department and triage nurse. Should you have questions after your visit or need to cancel or reschedule your appointment, please contact [facility]  [phone] and follow the prompts.  Office hours are 8:00 a.m. to 4:30 p.m. Monday - Friday. Please note that voicemails left after 4:00 p.m. may not be returned until the following business day.  We are closed weekends and major holidays. You have access to a nurse at all times for urgent questions. Please call the main number to the clinic [phone] and follow the prompts.  For any non-urgent questions, you may also contact your provider using MyChart. We now offer e-Visits for anyone 69 and older to request care online for non-urgent symptoms. For details visit [email or web address].   Also download the MyChart app! Go to the app store, search "MyChart", open the app, select [facility], and log in with your MyChart username and password.

## 2022-08-25 NOTE — Patient Instructions (Signed)
Implanted Port Removal, Care After The following information offers guidance on how to care for yourself after your procedure. Your health care provider may also give you more specific instructions. If you have problems or questions, contact your health care provider. What can I expect after the procedure? After the procedure, it is common to have: Soreness or pain near your incision. Some swelling or bruising near your incision. Follow these instructions at home: Medicines Take over-the-counter and prescription medicines only as told by your health care provider. If you were prescribed an antibiotic medicine, take it as told by your health care provider. Do not stop taking the antibiotic even if you start to feel better. Bathing Do not take baths, swim, or use a hot tub until your health care provider approves. Ask your health care provider if you can take showers. You may only be allowed to take sponge baths. Incision care  Follow instructions from your health care provider about how to take care of your incision. Make sure you: Wash your hands with soap and water for at least 20 seconds before and after you change your bandage (dressing). If soap and water are not available, use hand sanitizer. Change your dressing as told by your health care provider. Keep your dressing dry. Leave stitches (sutures), skin glue, or adhesive strips in place. These skin closures may need to stay in place for 2 weeks or longer. If adhesive strip edges start to loosen and curl up, you may trim the loose edges. Do not remove adhesive strips completely unless your health care provider tells you to do that. Check your incision area every day for signs of infection. Check for: More redness, swelling, or pain. More fluid or blood. Warmth. Pus or a bad smell. Activity Return to your normal activities as told by your health care provider. Ask your health care provider what activities are safe for you. You may have  to avoid lifting. Ask your health care provider how much you can safely lift. Do not do activities that involve lifting your arms over your head. Driving  If you were given a sedative during the procedure, it can affect you for several hours. Do not drive or operate machinery until your health care provider says that it is safe. If you did not receive a sedative, ask your health care provider when it is safe to drive. General instructions Do not use any products that contain nicotine or tobacco. These products include cigarettes, chewing tobacco, and vaping devices, such as e-cigarettes. These can delay healing after surgery. If you need help quitting, ask your health care provider. Keep all follow-up visits. This is important. Contact a health care provider if: You have a fever or chills. You have more redness, swelling, or pain around your incision. You have more fluid or blood coming from your incision. Your incision feels warm to the touch. You have pus or a bad smell coming from your incision. You have pain that is not relieved by your pain medicine. Get help right away if: You have chest pain. You have difficulty breathing. These symptoms may be an emergency. Get help right away. Call 911. Do not wait to see if the symptoms will go away. Do not drive yourself to the hospital. Summary After the procedure, it is common to have pain, soreness, swelling, or bruising near your incision. If you were prescribed an antibiotic medicine, take it as told by your health care provider. Do not stop taking the antibiotic even if you  start to feel better. If you were given a sedative during the procedure, it can affect you for several hours. Do not drive or operate machinery until your health care provider says that it is safe. Return to your normal activities as told by your health care provider. Ask your health care provider what activities are safe for you. This information is not intended to  replace advice given to you by your health care provider. Make sure you discuss any questions you have with your health care provider. Document Revised: 02/03/2021 Document Reviewed: 02/03/2021 Elsevier Patient Education  [place].

## 2022-08-25 NOTE — Progress Notes (Signed)
OK to treat without CMET results back today per order of Dr. Marin Olp.

## 2022-08-26 ENCOUNTER — Other Ambulatory Visit: Payer: Self-pay

## 2022-08-26 ENCOUNTER — Ambulatory Visit
Admission: RE | Admit: 2022-08-26 | Discharge: 2022-08-26 | Disposition: A | Discharge status: Home / Self Care | Payer: Managed Care, Other (non HMO) | Source: Ambulatory Visit | Attending: Internal Medicine | Admitting: Internal Medicine

## 2022-08-26 DIAGNOSIS — C7931 Secondary malignant neoplasm of brain: Secondary | ICD-10-CM

## 2022-08-26 MED ORDER — GADOPICLENOL 0.5 MMOL/ML IV SOLN
10.0000 mL | Freq: Once | INTRAVENOUS | Status: AC | PRN
  Administered 2022-08-26: 10 mL via INTRAVENOUS

## 2022-08-28 ENCOUNTER — Other Ambulatory Visit: Payer: Self-pay

## 2022-08-30 ENCOUNTER — Inpatient Hospital Stay: Payer: Managed Care, Other (non HMO) | Admitting: Internal Medicine

## 2022-08-30 VITALS — BP 132/92 | HR 80 | Temp 97.7°F | Resp 19 | Ht 70.0 in | Wt 310.1 lb

## 2022-08-30 DIAGNOSIS — Y842 Radiological procedure and radiotherapy as the cause of abnormal reaction of the patient, or of later complication, without mention of misadventure at the time of the procedure: Secondary | ICD-10-CM

## 2022-08-30 DIAGNOSIS — C7931 Secondary malignant neoplasm of brain: Secondary | ICD-10-CM

## 2022-08-30 DIAGNOSIS — I6789 Other cerebrovascular disease: Secondary | ICD-10-CM | POA: Diagnosis not present

## 2022-08-30 DIAGNOSIS — C3490 Malignant neoplasm of unspecified part of unspecified bronchus or lung: Secondary | ICD-10-CM

## 2022-08-30 DIAGNOSIS — Z5112 Encounter for antineoplastic immunotherapy: Secondary | ICD-10-CM | POA: Diagnosis not present

## 2022-08-30 NOTE — Progress Notes (Signed)
[facility] at [facility] [address]  [place], [place] [address] [phone]   Interval Evaluation  Date of Service: 08/30/22 Patient Name: Kevin Hunter Patient MRN: [id number] Patient DOB: Jul 25, 1966 Provider: Henreitta Leber, MD  Identifying Statement:  Kevin Hunter is a 57 y.o. male with Non-small cell lung cancer metastatic to brain [facility])   Primary Cancer:  Oncologic History: Oncology History  Lung cancer, primary, with metastasis from lung to other site, right (HCC)  05/27/2021 Initial Diagnosis   Lung cancer, primary, with metastasis from lung to other site, right [facility])   05/27/2021 Cancer Staging   Staging form: Lung, AJCC 8th Edition - Clinical stage from 05/27/2021: Stage IV (cT3, cN2, pM1) - Signed by Josph Macho, MD on 05/27/2021 Histologic grade (G): G2 Histologic grading system: 4 grade system   06/25/2021 - 07/14/2021 Chemotherapy   Patient is on Treatment Plan : LUNG NSCLC SQUAMOUS Nivolumab + Ipilimumab + Carboplatin + Paclitaxel q42d X 1 cycle / Nivolumab + Ipilimumab q42d     07/22/2021 - 10/16/2021 Chemotherapy   Patient is on Treatment Plan : LUNG Carboplatin / Docetaxel q21d     07/22/2021 - 03/17/2022 Chemotherapy   Patient is on Treatment Plan : LUNG Nivolumab q21d     07/22/2021 -  Chemotherapy   Patient is on Treatment Plan : LUNG Nivolumab (240) q14d      CNS Oncologic History 06/12/21: SRS x3 [facility])  Interval History: Kevin Hunter presents today for follow up after recent MRI brain.  No new or progressive changes, no return of left sided weakness.  Denies speech issues.  Has been off decadron.  Continues on opdivo with Dr. Myna Hidalgo.    H+P (02/08/22) Patient presents today for follow up after recent neurologic symptoms.  He describes numbness and weakness of his left arm and leg, progressive over several weeks.  He began dragging the left leg, interfering with ambulation.  Also describes issues with  cognition, short term memory.  Morning headaches and blurry vision have accompanied these changes.  Decadron was started late last week, this has led to significantly improved symptom burden.  He feels close to his baseline at this time.  Continues on opdivo with Dr. Myna Hidalgo.    Medications: Current Outpatient Medications on File Prior to Visit  Medication Sig Dispense Refill   amLODipine (NORVASC) 5 MG tablet Take 5 mg by mouth at bedtime.     clonazePAM (KLONOPIN) 1 MG tablet Take 1 mg by mouth 2 (two) times daily as needed for anxiety (May take additional 0.5mg  as needed for panic attack).     cyclobenzaprine (FLEXERIL) 10 MG tablet Take 1 tablet (10 mg total) by mouth 3 (three) times daily as needed for muscle spasms. 60 tablet 1   fluconazole (DIFLUCAN) 100 MG tablet Take 1 tablet (100 mg total) by mouth daily. 30 tablet 5   ibuprofen (ADVIL) 200 MG tablet Take 800 mg by mouth every 6 (six) hours as needed for headache.     pantoprazole (PROTONIX) 40 MG tablet TAKE 1 TABLET BY MOUTH TWICE A DAY 180 tablet 2   pseudoephedrine (SUDAFED) 30 MG tablet Take 60 mg by mouth every 4 (four) hours as needed (headache).     psyllium (METAMUCIL) 58.6 % packet Take 1 packet by mouth daily.     QUEtiapine (SEROQUEL XR) 400 MG 24 hr tablet Take 400 mg by mouth at bedtime.     solifenacin (VESICARE) 10 MG tablet Take  10 mg by mouth daily.     traZODone (DESYREL) 100 MG tablet Take 200 mg by mouth at bedtime.     venlafaxine (EFFEXOR) 75 MG tablet Take 150 mg by mouth at bedtime.     [DISCONTINUED] prochlorperazine (COMPAZINE) 10 MG tablet Take 1 tablet (10 mg total) by mouth every 6 (six) hours as needed (Nausea or vomiting). 30 tablet 1   No current facility-administered medications on file prior to visit.    Allergies:  Allergies  Allergen Reactions   Paclitaxel Anaphylaxis   Penicillins Other (See Comments)    Unknown Reaction when he was an infant.   Past Medical History:  Past Medical  History:  Diagnosis Date   Anxiety    Bipolar 1 disorder (HCC)    Goals of care, counseling/discussion 05/27/2021   Hypertension    Lung cancer, primary, with metastasis from lung to other site, right (HCC) 05/27/2021   Mood disorder (HCC)    Sleep apnea    Past Surgical History:  Past Surgical History:  Procedure Laterality Date   BRONCHIAL BIOPSY  05/22/2021   Procedure: BRONCHIAL BIOPSIES;  Surgeon: Josephine Igo, DO;  Location: [facility];  Service: Pulmonary;;   BRONCHIAL BRUSHINGS  05/22/2021   Procedure: BRONCHIAL BRUSHINGS;  Surgeon: Josephine Igo, DO;  Location: [facility];  Service: Pulmonary;;   BRONCHIAL NEEDLE ASPIRATION BIOPSY  05/22/2021   Procedure: BRONCHIAL NEEDLE ASPIRATION BIOPSIES;  Surgeon: Josephine Igo, DO;  Location: [facility];  Service: Pulmonary;;   IR IMAGING GUIDED PORT INSERTION  06/24/2021   VIDEO BRONCHOSCOPY WITH ENDOBRONCHIAL NAVIGATION N/A 05/22/2021   Procedure: VIDEO BRONCHOSCOPY WITH ENDOBRONCHIAL NAVIGATION;  Surgeon: Josephine Igo, DO;  Location: [facility];  Service: Pulmonary;  Laterality: N/A;   VIDEO BRONCHOSCOPY WITH RADIAL ENDOBRONCHIAL ULTRASOUND  05/22/2021   Procedure: RADIAL ENDOBRONCHIAL ULTRASOUND;  Surgeon: Josephine Igo, DO;  Location: [facility];  Service: Pulmonary;;   Social History:  Social History   Socioeconomic History   Marital status: Single    Spouse name: Not on file   Number of children: Not on file   Years of education: Not on file   Highest education level: Not on file  Occupational History   Not on file  Tobacco Use   Smoking status: Former    Packs/day: 1.50    Years: 30.00    Total pack years: 45.00    Types: Cigarettes, E-cigarettes    Start date: 05/16/2018    Quit date: 05/16/2018    Years since quitting: 4.2   Smokeless tobacco: Never  Vaping Use   Vaping Use: Never used  Substance and Sexual Activity   Alcohol use: Never   Drug use: Never   Sexual activity: Not on file   Other Topics Concern   Not on file  Social History Narrative   Not on file   Social Determinants of Health   Financial Resource Strain: Not on file  Food Insecurity: Not on file  Transportation Needs: Not on file  Physical Activity: Not on file  Stress: Not on file  Social Connections: Not on file  Intimate Partner Violence: Not At Risk (05/28/2021)   Humiliation, Afraid, Rape, and Kick questionnaire    Fear of Current or Ex-Partner: No    Emotionally Abused: No    Physically Abused: No    Sexually Abused: No   Family History:  Family History  Problem Relation Age of Onset   Breast cancer Mother    Lung cancer Father  Review of Systems: Constitutional: Doesn't report fevers, chills or abnormal weight loss Eyes: Doesn't report blurriness of vision Ears, nose, mouth, throat, and face: Doesn't report sore throat Respiratory: Doesn't report cough, dyspnea or wheezes Cardiovascular: Doesn't report palpitation, chest discomfort  Gastrointestinal:  Doesn't report nausea, constipation, diarrhea GU: Doesn't report incontinence Skin: Doesn't report skin rashes Neurological: Per HPI Musculoskeletal: Doesn't report joint pain Behavioral/Psych: Doesn't report anxiety  Physical Exam: Vitals:   08/30/22 1048  BP: (!) 132/92  Pulse: 80  Resp: 19  Temp: 97.7 F (36.5 C)  SpO2: 94%    KPS: 90. General: Alert, cooperative, pleasant, in no acute distress Head: Normal EENT: No conjunctival injection or scleral icterus.  Lungs: Resp effort normal Cardiac: Regular rate Abdomen: Non-distended abdomen Skin: No rashes cyanosis or petechiae. Extremities: No clubbing or edema  Neurologic Exam: Mental Status: Awake, alert, attentive to examiner. Oriented to self and environment. Language is fluent with intact comprehension.  Cranial Nerves: Visual acuity is grossly normal. Visual fields are full. Extra-ocular movements intact. No ptosis. Face is symmetric Motor: Tone and bulk  are normal. Power is 5/5 in left arm and leg. Reflexes are symmetric, no pathologic reflexes present.  Sensory: Intact to light touch Gait: Independent   Labs: I have reviewed the data as listed    Component Value Date/Time   NA 139 08/25/2022 0946   K 3.6 08/25/2022 0946   CL 105 08/25/2022 0946   CO2 26 08/25/2022 0946   GLUCOSE 88 08/25/2022 0946   BUN 15 08/25/2022 0946   CREATININE 1.09 08/25/2022 0946   CALCIUM 8.8 (L) 08/25/2022 0946   PROT 7.2 08/25/2022 0946   ALBUMIN 4.0 08/25/2022 0946   AST 18 08/25/2022 0946   ALT 23 08/25/2022 0946   ALKPHOS 66 08/25/2022 0946   BILITOT 0.3 08/25/2022 0946   GFRNONAA >60 08/25/2022 0946   Lab Results  Component Value Date   WBC 5.9 08/25/2022   NEUTROABS 3.9 08/25/2022   HGB 13.9 08/25/2022   HCT 40.9 08/25/2022   MCV 91.3 08/25/2022   PLT 157 08/25/2022    Imaging:  MR BRAIN W WO CONTRAST  Result Date: 08/26/2022 CLINICAL DATA:  Follow-up metastatic lung cancer. EXAM: MRI HEAD WITHOUT AND WITH CONTRAST TECHNIQUE: Multiplanar, multiecho pulse sequences of the brain and surrounding structures were obtained without and with intravenous contrast. CONTRAST:  10 mL Vueway COMPARISON:  Head MRI 06/21/2022 FINDINGS: Brain: New lesions: None. Larger lesions: 1. 3.0 x 2.8 cm lesion in the left parietal lobe, mildly increased in size with mild-to-moderate worsening of severe surrounding edema (series 13, image 105). 2. 2.1 x 2.0 cm lesion in the left frontal lobe, mildly increased in size with slight worsening of moderate edema (series 13, image 116). Stable or smaller lesions: 1. 2.6 x 2.3 cm lesion in the high posterior right frontal lobe, slightly decreased in size with stable to slightly decreased edema (series 13, image 147). Other brain findings: There are chronic blood products within each of the 3 treated brain metastases. There is increased, mild-to-moderate mass effect on the posterior aspect of the left lateral ventricle due to  worsening left parietal edema. There is no evidence of hydrocephalus, acute infarct, or extra-axial fluid collection. There is trace rightward midline shift. Vascular: Major intracranial vascular flow voids are preserved. Skull and upper cervical spine: Increased size of a 1.5 cm enhancing skull lesion at the right frontoparietal vertex (series 13, image 160). Sinuses/Orbits: Unremarkable orbits. Paranasal sinuses and mastoid air cells are clear.  Other: None. IMPRESSION: 1. Mildly increased size of left frontal and left parietal metastases with worsening edema. 2. Slightly decreased size of the right frontal metastasis with stable to slightly decreased edema. 3. No evidence of new intracranial metastases. 4. Increased size of a 1.5 cm skull lesion at the vertex. Electronically Signed   By: Sebastian Ache M.D.   On: 08/26/2022 14:09    [facility] Clinician Interpretation: I have personally reviewed the radiological images as listed.  My interpretation, in the context of the patient's clinical presentation, is treatment effect vs true progression   Assessment/Plan Non-small cell lung cancer metastatic to brain [facility])  Edwyna Shell is clinically stable today, now off corticosteroids.  MRI brain demonstrates increased enhancement and inflammation surrounding left parietal and frontal metastases, previously treated with SRS.  Etiology is either tumor progression or radionecrosis.  We discussed various options moving forward, including resumption of decadron, diagnostic biopsy.    After discussion, he would prefer advanced imaging modality, FDG-PET, for better characterization.  PET can help differentiate between tumor and radiation necrosis.  Decadron should remain off, if tolerated.  Dr. Myna Hidalgo will con't to follow.  We ask that Edwyna Shell return to clinic in 2 weeks following FDG-PET brain, or sooner as needed.  All questions were answered. The patient knows to call the clinic with any problems,  questions or concerns. No barriers to learning were detected.  The total time spent in the encounter was 40 minutes and more than 50% was on counseling and review of test results   Henreitta Leber, MD Medical Director of Neuro-Oncology [facility] at [facility] 08/30/22 11:02 AM

## 2022-09-06 ENCOUNTER — Other Ambulatory Visit: Payer: Self-pay | Admitting: Radiation Therapy

## 2022-09-08 ENCOUNTER — Other Ambulatory Visit: Payer: Self-pay

## 2022-09-17 ENCOUNTER — Encounter (HOSPITAL_COMMUNITY)
Admission: RE | Admit: 2022-09-17 | Discharge: 2022-09-17 | Disposition: A | Discharge status: Home / Self Care | Payer: Self-pay | Source: Ambulatory Visit | Attending: Internal Medicine | Admitting: Internal Medicine

## 2022-09-17 ENCOUNTER — Encounter: Payer: Self-pay | Admitting: Hematology & Oncology

## 2022-09-17 DIAGNOSIS — Y842 Radiological procedure and radiotherapy as the cause of abnormal reaction of the patient, or of later complication, without mention of misadventure at the time of the procedure: Secondary | ICD-10-CM | POA: Insufficient documentation

## 2022-09-17 DIAGNOSIS — I6789 Other cerebrovascular disease: Secondary | ICD-10-CM | POA: Diagnosis present

## 2022-09-17 DIAGNOSIS — C3490 Malignant neoplasm of unspecified part of unspecified bronchus or lung: Secondary | ICD-10-CM | POA: Diagnosis not present

## 2022-09-17 DIAGNOSIS — C7931 Secondary malignant neoplasm of brain: Secondary | ICD-10-CM | POA: Diagnosis present

## 2022-09-17 LAB — GLUCOSE, CAPILLARY: Glucose-Capillary: 110 mg/dL — ABNORMAL HIGH (ref 70–99)

## 2022-09-17 MED ORDER — FLUDEOXYGLUCOSE F - 18 (FDG) INJECTION
10.0000 | Freq: Once | INTRAVENOUS | Status: AC
  Administered 2022-09-17: 9.97 via INTRAVENOUS

## 2022-09-20 ENCOUNTER — Other Ambulatory Visit: Payer: Self-pay

## 2022-09-20 ENCOUNTER — Inpatient Hospital Stay: Payer: Managed Care, Other (non HMO) | Attending: Hematology & Oncology

## 2022-09-20 ENCOUNTER — Inpatient Hospital Stay (HOSPITAL_BASED_OUTPATIENT_CLINIC_OR_DEPARTMENT_OTHER): Payer: Managed Care, Other (non HMO) | Admitting: Internal Medicine

## 2022-09-20 VITALS — BP 129/82 | HR 79 | Temp 98.1°F | Resp 14 | Wt 305.4 lb

## 2022-09-20 DIAGNOSIS — R519 Headache, unspecified: Secondary | ICD-10-CM | POA: Diagnosis not present

## 2022-09-20 DIAGNOSIS — Z79899 Other long term (current) drug therapy: Secondary | ICD-10-CM | POA: Insufficient documentation

## 2022-09-20 DIAGNOSIS — H538 Other visual disturbances: Secondary | ICD-10-CM | POA: Insufficient documentation

## 2022-09-20 DIAGNOSIS — C7931 Secondary malignant neoplasm of brain: Secondary | ICD-10-CM | POA: Insufficient documentation

## 2022-09-20 DIAGNOSIS — R200 Anesthesia of skin: Secondary | ICD-10-CM | POA: Diagnosis not present

## 2022-09-20 DIAGNOSIS — Z87891 Personal history of nicotine dependence: Secondary | ICD-10-CM | POA: Diagnosis not present

## 2022-09-20 DIAGNOSIS — C3491 Malignant neoplasm of unspecified part of right bronchus or lung: Secondary | ICD-10-CM | POA: Diagnosis present

## 2022-09-20 DIAGNOSIS — C3490 Malignant neoplasm of unspecified part of unspecified bronchus or lung: Secondary | ICD-10-CM | POA: Diagnosis not present

## 2022-09-20 DIAGNOSIS — I10 Essential (primary) hypertension: Secondary | ICD-10-CM | POA: Diagnosis not present

## 2022-09-20 NOTE — Progress Notes (Signed)
[facility] [place], [place] [address] [phone]   Interval Evaluation  Date of Service: 09/20/22 Patient Name: Kevin Hunter Patient MRN: [id number] Patient DOB: Mar 12, 1966 Provider: Ventura Sellers, MD  Identifying Statement:  Kevin Hunter is a 57 y.o. male with Non-small cell lung cancer metastatic to brain [facility])   Primary Cancer:  Oncologic History: Oncology History  Lung cancer, primary, with metastasis from lung to other site, right ([place])  05/27/2021 Initial Diagnosis   Lung cancer, primary, with metastasis from lung to other site, right [facility])   05/27/2021 Cancer Staging   Staging form: Lung, AJCC 8th Edition - Clinical stage from 05/27/2021: Stage IV (cT3, cN2, pM1) - Signed by [name], MD on 05/27/2021 Histologic grade (G): G2 Histologic grading system: 4 grade system   06/25/2021 - 07/14/2021 Chemotherapy   Patient is on Treatment Plan : LUNG NSCLC SQUAMOUS Nivolumab + Ipilimumab + Carboplatin + Paclitaxel q42d X 1 cycle / Nivolumab + Ipilimumab q42d     07/22/2021 - 10/16/2021 Chemotherapy   Patient is on Treatment Plan : LUNG Carboplatin / Docetaxel q21d     07/22/2021 - 03/17/2022 Chemotherapy   Patient is on Treatment Plan : LUNG Nivolumab q21d     07/22/2021 -  Chemotherapy   Patient is on Treatment Plan : LUNG Nivolumab (240) q14d      CNS Oncologic History 06/12/21: SRS x3 [facility])  Interval History: Kevin Hunter presents today for follow up after recent PET brain study.  Denies clinical changes today, no return of left sided weakness.  Denies speech issues.  Has been off decadron.  Continues on opdivo with Dr. Marin Olp.    H+P (02/08/22) Patient presents today for follow up after recent neurologic symptoms.  He describes numbness and weakness of his left arm and leg, progressive over several weeks.  He began dragging the left leg, interfering with ambulation.  Also describes issues with  cognition, short term memory.  Morning headaches and blurry vision have accompanied these changes.  Decadron was started late last week, this has led to significantly improved symptom burden.  He feels close to his baseline at this time.  Continues on opdivo with Dr. Marin Olp.    Medications: Current Outpatient Medications on File Prior to Visit  Medication Sig Dispense Refill   amLODipine (NORVASC) 5 MG tablet Take 5 mg by mouth at bedtime.     clonazePAM (KLONOPIN) 1 MG tablet Take 1 mg by mouth 2 (two) times daily as needed for anxiety (May take additional 0.5mg  as needed for panic attack).     cyclobenzaprine (FLEXERIL) 10 MG tablet Take 1 tablet (10 mg total) by mouth 3 (three) times daily as needed for muscle spasms. 60 tablet 1   fluconazole (DIFLUCAN) 100 MG tablet Take 1 tablet (100 mg total) by mouth daily. 30 tablet 5   pantoprazole (PROTONIX) 40 MG tablet TAKE 1 TABLET BY MOUTH TWICE A DAY 180 tablet 2   pseudoephedrine (SUDAFED) 30 MG tablet Take 60 mg by mouth every 4 (four) hours as needed (headache).     psyllium (METAMUCIL) 58.6 % packet Take 1 packet by mouth daily.     QUEtiapine (SEROQUEL XR) 400 MG 24 hr tablet Take 400 mg by mouth at bedtime.     solifenacin (VESICARE) 10 MG tablet Take 10 mg by mouth daily.     traZODone (DESYREL) 100 MG tablet Take 200 mg by mouth at bedtime.  venlafaxine (EFFEXOR) 75 MG tablet Take 150 mg by mouth at bedtime.     [DISCONTINUED] prochlorperazine (COMPAZINE) 10 MG tablet Take 1 tablet (10 mg total) by mouth every 6 (six) hours as needed (Nausea or vomiting). 30 tablet 1   No current facility-administered medications on file prior to visit.    Allergies:  Allergies  Allergen Reactions   Paclitaxel Anaphylaxis   Penicillins Other (See Comments)    Unknown Reaction when he was an infant.   Past Medical History:  Past Medical History:  Diagnosis Date   Anxiety    Bipolar 1 disorder ([place])    Goals of care, counseling/discussion  05/27/2021   Hypertension    Lung cancer, primary, with metastasis from lung to other site, right ([place]) 05/27/2021   Mood disorder ([place])    Sleep apnea    Past Surgical History:  Past Surgical History:  Procedure Laterality Date   BRONCHIAL BIOPSY  05/22/2021   Procedure: BRONCHIAL BIOPSIES;  Surgeon: Garner Nash, DO;  Location: [facility];  Service: Pulmonary;;   BRONCHIAL BRUSHINGS  05/22/2021   Procedure: BRONCHIAL BRUSHINGS;  Surgeon: Garner Nash, DO;  Location: [facility];  Service: Pulmonary;;   BRONCHIAL NEEDLE ASPIRATION BIOPSY  05/22/2021   Procedure: BRONCHIAL NEEDLE ASPIRATION BIOPSIES;  Surgeon: Garner Nash, DO;  Location: [facility];  Service: Pulmonary;;   IR IMAGING GUIDED PORT INSERTION  06/24/2021   VIDEO BRONCHOSCOPY WITH ENDOBRONCHIAL NAVIGATION N/A 05/22/2021   Procedure: VIDEO BRONCHOSCOPY WITH ENDOBRONCHIAL NAVIGATION;  Surgeon: Garner Nash, DO;  Location: [place];  Service: Pulmonary;  Laterality: N/A;   VIDEO BRONCHOSCOPY WITH RADIAL ENDOBRONCHIAL ULTRASOUND  05/22/2021   Procedure: RADIAL ENDOBRONCHIAL ULTRASOUND;  Surgeon: Garner Nash, DO;  Location: [facility];  Service: Pulmonary;;   Social History:  Social History   Socioeconomic History   Marital status: Single    Spouse name: Not on file   Number of children: Not on file   Years of education: Not on file   Highest education level: Not on file  Occupational History   Not on file  Tobacco Use   Smoking status: Former    Packs/day: 1.50    Years: 30.00    Total pack years: 45.00    Types: Cigarettes, E-cigarettes    Start date: 05/16/2018    Quit date: 05/16/2018    Years since quitting: 4.3   Smokeless tobacco: Never  Vaping Use   Vaping Use: Never used  Substance and Sexual Activity   Alcohol use: Never   Drug use: Never   Sexual activity: Not on file  Other Topics Concern   Not on file  Social History Narrative   Not on file   Social Determinants of  Health   Financial Resource Strain: Not on file  Food Insecurity: Not on file  Transportation Needs: Not on file  Physical Activity: Not on file  Stress: Not on file  Social Connections: Not on file  Intimate Partner Violence: Not At Risk (05/28/2021)   Humiliation, Afraid, Rape, and Kick questionnaire    Fear of Current or Ex-Partner: No    Emotionally Abused: No    Physically Abused: No    Sexually Abused: No   Family History:  Family History  Problem Relation Age of Onset   Breast cancer Mother    Lung cancer Father     Review of Systems: Constitutional: Doesn't report fevers, chills or abnormal weight loss Eyes: Doesn't report blurriness of vision Ears, nose, mouth, throat,  and face: Doesn't report sore throat Respiratory: Doesn't report cough, dyspnea or wheezes Cardiovascular: Doesn't report palpitation, chest discomfort  Gastrointestinal:  Doesn't report nausea, constipation, diarrhea GU: Doesn't report incontinence Skin: Doesn't report skin rashes Neurological: Per HPI Musculoskeletal: Doesn't report joint pain Behavioral/Psych: Doesn't report anxiety  Physical Exam: There were no vitals filed for this visit.   KPS: 90. General: Alert, cooperative, pleasant, in no acute distress Head: Normal EENT: No conjunctival injection or scleral icterus.  Lungs: Resp effort normal Cardiac: Regular rate Abdomen: Non-distended abdomen Skin: No rashes cyanosis or petechiae. Extremities: No clubbing or edema  Neurologic Exam: Mental Status: Awake, alert, attentive to examiner. Oriented to self and environment. Language is fluent with intact comprehension.  Cranial Nerves: Visual acuity is grossly normal. Visual fields are full. Extra-ocular movements intact. No ptosis. Face is symmetric Motor: Tone and bulk are normal. Power is 5/5 in left arm and leg. Reflexes are symmetric, no pathologic reflexes present.  Sensory: Intact to light touch Gait: Independent   Labs: I  have reviewed the data as listed    Component Value Date/Time   NA 139 08/25/2022 0946   K 3.6 08/25/2022 0946   CL 105 08/25/2022 0946   CO2 26 08/25/2022 0946   GLUCOSE 88 08/25/2022 0946   BUN 15 08/25/2022 0946   CREATININE 1.09 08/25/2022 0946   CALCIUM 8.8 (L) 08/25/2022 0946   PROT 7.2 08/25/2022 0946   ALBUMIN 4.0 08/25/2022 0946   AST 18 08/25/2022 0946   ALT 23 08/25/2022 0946   ALKPHOS 66 08/25/2022 0946   BILITOT 0.3 08/25/2022 0946   GFRNONAA >60 08/25/2022 0946   Lab Results  Component Value Date   WBC 5.9 08/25/2022   NEUTROABS 3.9 08/25/2022   HGB 13.9 08/25/2022   HCT 40.9 08/25/2022   MCV 91.3 08/25/2022   PLT 157 08/25/2022    Imaging:  NM PET Metabolic Brain  Result Date: 09/17/2022 CLINICAL DATA:  Lung cancer with brain metastasis evaluate for tumor necrosis versus tumor growth. EXAM: NM PET METABOLIC BRAIN TECHNIQUE: 9.97 mCi F-18 FDG was injected intravenously. Full-ring PET imaging was performed from the vertex to skull base. CT data was obtained and used for attenuation correction and anatomic localization. PET data set post processing fusion with MRI 08/26/2022. FASTING BLOOD GLUCOSE:  Value: 110 mg/dl COMPARISON:  Brain MRI 06/27/2023 FINDINGS: PET data set fusion with axial post contrast MRI. Enhancing lesion in the posterior LEFT parietal lobe demonstrates very low peripheral metabolic activity which is less than adjacent normal cortical gray matter. The metabolic activity is slightly above white matter activity. The lesion in the LEFT frontal lobe has even lower metabolic activity significantly less than the adjacent normal cortical gray matter and only barely perceivable activity above white matter. Lesion in the superior RIGHT frontal lobe has no radiotracer activity above background white matter. IMPRESSION: 1. Lesion in the posterior LEFT parietal lobe has metabolic activity less than adjacent normal gray matter and only slightly above white matter.  FAVOR TUMOR NECROSIS over tumor recurrence. 2. Lesion in the LEFT frontal lobe has barely perceptible metabolic activity and is NOT favored tumor recurrence. 3. Lesion in the high RIGHT frontal lobe has no perceptible metabolic activity consistent with nonviable tumor. Electronically Signed   By: Suzy Bouchard M.D.   On: 09/17/2022 09:09   MR BRAIN W WO CONTRAST  Result Date: 08/26/2022 CLINICAL DATA:  Follow-up metastatic lung cancer. EXAM: MRI HEAD WITHOUT AND WITH CONTRAST TECHNIQUE: Multiplanar, multiecho pulse sequences of  the brain and surrounding structures were obtained without and with intravenous contrast. CONTRAST:  10 mL Vueway COMPARISON:  Head MRI 06/21/2022 FINDINGS: Brain: New lesions: None. Larger lesions: 1. 3.0 x 2.8 cm lesion in the left parietal lobe, mildly increased in size with mild-to-moderate worsening of severe surrounding edema (series 13, image 105). 2. 2.1 x 2.0 cm lesion in the left frontal lobe, mildly increased in size with slight worsening of moderate edema (series 13, image 116). Stable or smaller lesions: 1. 2.6 x 2.3 cm lesion in the high posterior right frontal lobe, slightly decreased in size with stable to slightly decreased edema (series 13, image 147). Other brain findings: There are chronic blood products within each of the 3 treated brain metastases. There is increased, mild-to-moderate mass effect on the posterior aspect of the left lateral ventricle due to worsening left parietal edema. There is no evidence of hydrocephalus, acute infarct, or extra-axial fluid collection. There is trace rightward midline shift. Vascular: Major intracranial vascular flow voids are preserved. Skull and upper cervical spine: Increased size of a 1.5 cm enhancing skull lesion at the right frontoparietal vertex (series 13, image 160). Sinuses/Orbits: Unremarkable orbits. Paranasal sinuses and mastoid air cells are clear. Other: None. IMPRESSION: 1. Mildly increased size of left frontal  and left parietal metastases with worsening edema. 2. Slightly decreased size of the right frontal metastasis with stable to slightly decreased edema. 3. No evidence of new intracranial metastases. 4. Increased size of a 1.5 cm skull lesion at the vertex. Electronically Signed   By: Logan Bores M.D.   On: 08/26/2022 14:09     Assessment/Plan Non-small cell lung cancer metastatic to brain [facility])  Kevin Hunter is clinically stable today.  Brain FDG-PET demonstrates hypometabolism within both left hemisphere lesions of interest.  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  We will continue to survey these progressive lesions with imaging alone for now.  Decadron should remain off, if tolerated.  Dr. Marin Olp will con't to follow.  We ask that Kevin Hunter return to clinic in 2 months with MRI brain, or sooner as needed.  All questions were answered. The patient knows to call the clinic with any problems, questions or concerns. No barriers to learning were detected.  The total time spent in the encounter was 30 minutes and more than 50% was on counseling and review of test results   Ventura Sellers, MD Medical Director of Neuro-Oncology [facility] [place] 09/20/22 9:08 AM

## 2022-09-21 ENCOUNTER — Other Ambulatory Visit: Payer: Self-pay | Admitting: Radiation Therapy

## 2022-09-21 ENCOUNTER — Other Ambulatory Visit: Payer: Self-pay | Admitting: Hematology & Oncology

## 2022-09-21 ENCOUNTER — Other Ambulatory Visit: Payer: Self-pay

## 2022-09-22 ENCOUNTER — Inpatient Hospital Stay: Payer: Managed Care, Other (non HMO)

## 2022-09-22 ENCOUNTER — Other Ambulatory Visit: Payer: Self-pay

## 2022-09-22 ENCOUNTER — Encounter: Payer: Self-pay | Admitting: Hematology & Oncology

## 2022-09-22 ENCOUNTER — Inpatient Hospital Stay (HOSPITAL_BASED_OUTPATIENT_CLINIC_OR_DEPARTMENT_OTHER): Payer: Self-pay | Admitting: Hematology & Oncology

## 2022-09-22 ENCOUNTER — Encounter: Payer: Self-pay | Admitting: *Deleted

## 2022-09-22 ENCOUNTER — Inpatient Hospital Stay: Payer: Managed Care, Other (non HMO) | Attending: Internal Medicine

## 2022-09-22 ENCOUNTER — Inpatient Hospital Stay: Payer: Managed Care, Other (non HMO) | Admitting: Licensed Clinical Social Worker

## 2022-09-22 VITALS — BP 134/84 | HR 64 | Temp 97.6°F | Resp 19 | Ht 70.0 in | Wt 302.0 lb

## 2022-09-22 DIAGNOSIS — Z5112 Encounter for antineoplastic immunotherapy: Secondary | ICD-10-CM | POA: Insufficient documentation

## 2022-09-22 DIAGNOSIS — C7931 Secondary malignant neoplasm of brain: Secondary | ICD-10-CM | POA: Insufficient documentation

## 2022-09-22 DIAGNOSIS — C3411 Malignant neoplasm of upper lobe, right bronchus or lung: Secondary | ICD-10-CM | POA: Insufficient documentation

## 2022-09-22 DIAGNOSIS — C7970 Secondary malignant neoplasm of unspecified adrenal gland: Secondary | ICD-10-CM | POA: Insufficient documentation

## 2022-09-22 DIAGNOSIS — C3491 Malignant neoplasm of unspecified part of right bronchus or lung: Secondary | ICD-10-CM

## 2022-09-22 DIAGNOSIS — Z79899 Other long term (current) drug therapy: Secondary | ICD-10-CM | POA: Diagnosis not present

## 2022-09-22 LAB — CBC WITH DIFFERENTIAL (CANCER CENTER ONLY)
Abs Immature Granulocytes: 0.02 10*3/uL (ref 0.00–0.07)
Basophils Absolute: 0 10*3/uL (ref 0.0–0.1)
Basophils Relative: 1 %
Eosinophils Absolute: 0.1 10*3/uL (ref 0.0–0.5)
Eosinophils Relative: 2 %
HCT: 41.1 % (ref 39.0–52.0)
Hemoglobin: 13.7 g/dL (ref 13.0–17.0)
Immature Granulocytes: 0 %
Lymphocytes Relative: 20 %
Lymphs Abs: 1.1 10*3/uL (ref 0.7–4.0)
MCH: 30.3 pg (ref 26.0–34.0)
MCHC: 33.3 g/dL (ref 30.0–36.0)
MCV: 90.9 fL (ref 80.0–100.0)
Monocytes Absolute: 0.6 10*3/uL (ref 0.1–1.0)
Monocytes Relative: 10 %
Neutro Abs: 3.7 10*3/uL (ref 1.7–7.7)
Neutrophils Relative %: 67 %
Platelet Count: 158 10*3/uL (ref 150–400)
RBC: 4.52 MIL/uL (ref 4.22–5.81)
RDW: 14.9 % (ref 11.5–15.5)
WBC Count: 5.6 10*3/uL (ref 4.0–10.5)
nRBC: 0 % (ref 0.0–0.2)

## 2022-09-22 LAB — CMP (CANCER CENTER ONLY)
ALT: 19 U/L (ref 0–44)
AST: 16 U/L (ref 15–41)
Albumin: 4.6 g/dL (ref 3.5–5.0)
Alkaline Phosphatase: 73 U/L (ref 38–126)
Anion gap: 9 (ref 5–15)
BUN: 11 mg/dL (ref 6–20)
CO2: 26 mmol/L (ref 22–32)
Calcium: 9.6 mg/dL (ref 8.9–10.3)
Chloride: 105 mmol/L (ref 98–111)
Creatinine: 1.14 mg/dL (ref 0.61–1.24)
GFR, Estimated: >60 mL/min (ref >60)
Glucose, Bld: 113 mg/dL — ABNORMAL HIGH (ref 70–99)
Potassium: 3.3 mmol/L — ABNORMAL LOW (ref 3.5–5.1)
Sodium: 140 mmol/L (ref 135–145)
Total Bilirubin: 0.4 mg/dL (ref 0.3–1.2)
Total Protein: 7.2 g/dL (ref 6.5–8.1)

## 2022-09-22 LAB — TSH: TSH: 1.318 u[IU]/mL (ref 0.350–4.500)

## 2022-09-22 MED ORDER — SODIUM CHLORIDE 0.9 % IV SOLN: Freq: Once | INTRAVENOUS | Status: AC

## 2022-09-22 MED ORDER — HEPARIN SOD (PORK) LOCK FLUSH 100 UNIT/ML IV SOLN
500.0000 [IU] | Freq: Once | INTRAVENOUS | Status: AC | PRN
  Administered 2022-09-22: 500 [IU]

## 2022-09-22 MED ORDER — SODIUM CHLORIDE 0.9% FLUSH
10.0000 mL | INTRAVENOUS | Status: DC | PRN
  Administered 2022-09-22: 10 mL

## 2022-09-22 MED ORDER — SODIUM CHLORIDE 0.9 % IV SOLN
480.0000 mg | Freq: Once | INTRAVENOUS | Status: AC
  Administered 2022-09-22: 480 mg via INTRAVENOUS
  Filled 2022-09-22: qty 48

## 2022-09-22 NOTE — Progress Notes (Signed)
[place] Work  Initial Assessment   Kevin Hunter is a 57 y.o. year old male presenting alone. Clinical Social Work was referred by new patient protocol for assessment of psychosocial needs.   SDOH (Social Determinants of Health) assessments performed: Yes SDOH Interventions    Flowsheet Row Clinical Support from 09/22/2022 in [place] [facility] [place] Interventions   Food Insecurity Interventions Intervention Not Indicated  Housing Interventions Intervention Not Indicated  Transportation Interventions Intervention Not Indicated  Utilities Interventions Intervention Not Indicated  Financial Strain Interventions Intervention Not Indicated       SDOH Screenings   Food Insecurity: No Food Insecurity (09/22/2022)  Housing: Low Risk  (09/22/2022)  Transportation Needs: No Transportation Needs (09/22/2022)  Utilities: Not At Risk (09/22/2022)  Financial Resource Strain: Low Risk  (09/22/2022)  Tobacco Use: Medium Risk (09/22/2022)     Distress Screen completed: No    12/21/2021    4:18 PM  ONCBCN DISTRESS SCREENING  Distress experienced in past week (1-10) 5  Emotional problem type Nervousness/Anxiety;Adjusting to illness      Family/Social Information:  Housing Arrangement: patient lives alone. Family members/support persons in your life? Family and Friends Transportation concerns: no  Employment: Working full time at [employer] in [place]  Income source: Employment Financial concerns: No Type of concern: None Food access concerns: no Religious or spiritual practice: No Services Currently in place:  [employer]   Coping/ Adjustment to diagnosis: Patient understands treatment plan and what happens next? yes Concerns about diagnosis and/or treatment: I'm not especially worried about anything Patient reported stressors:  None Hopes and/or priorities: Patient did not disclose. Patient enjoys watching TV Current coping skills/  strengths: Capable of independent living , Communication skills , Financial means , General fund of knowledge , and Supportive family/friends     SUMMARY: Current SDOH Barriers:  Patient expressed limited social connections.  Clinical Social Work Clinical Goal(s):  Explore community resource options for unmet needs related to:  Social Connections  Interventions: Discussed common feeling and emotions when being diagnosed with cancer, and the importance of support during treatment Informed patient of the support team roles and support services at [facility] Provided [place] contact information and encouraged patient to call with any questions or concerns Provided patient with information about the [employer] and [employer] and [employer].  Discussed support groups and self-care.   Follow Up Plan: Patient will contact CSW with any support or resource needs Patient verbalizes understanding of plan: Yes    Rodman Pickle Cyd Hostler, LCSW

## 2022-09-22 NOTE — Patient Instructions (Signed)

## 2022-09-22 NOTE — Progress Notes (Signed)
Hematology and Oncology Follow Up Visit  Kevin Hunter [id number] 1965/11/13 57 y.o. 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 He is doing well and has no complaints at this time.  He did have a recent MRI.  The MRI was not done of the brain.  Seem to show that there may be some tumor progression.  However, the incredibly smaller [name] ordered a brain PET scan.  This was done on 09/17/2022.  The brain PET scan did not show any activity that would suggest progressive disease.  The radiologist felt this was all consistent with tumor necrosis.  He is working.  He enjoys work.  He has had no problems at work.  There is been no problems with fever.  He has had no cough or shortness of breath.  His appetite is good.  He has had no nausea or vomiting.  He has had no change in bowel or bladder habits.  His last TSH back on 08/25/2022 was 1.5.  Currently, I would have to say that his performance status is probably ECOG 0.   Medications:  Allergies as of 09/22/2022       Reactions   Paclitaxel Anaphylaxis   Penicillins Other (See Comments)   Unknown Reaction when he was an infant.        Medication List        Accurate as of September 22, 2022 10:42 AM. If you have any questions, ask your nurse or doctor.          amLODipine 5 MG tablet Commonly known as: NORVASC Take 5 mg by mouth at bedtime.   clonazePAM 1 MG tablet Commonly known as: KLONOPIN Take 1 mg by mouth 2 (two) times daily as needed for  anxiety (May take additional 0.5mg  as needed for panic attack).   cyclobenzaprine 10 MG tablet Commonly known as: FLEXERIL Take 1 tablet (10 mg total) by mouth 3 (three) times daily as needed for muscle spasms.   fluconazole 100 MG tablet Commonly known as: DIFLUCAN TAKE 1 TABLET BY MOUTH EVERY DAY   pantoprazole 40 MG tablet Commonly known as: PROTONIX TAKE 1 TABLET BY MOUTH TWICE A DAY   pseudoephedrine 30 MG tablet Commonly known as: SUDAFED Take 60 mg by mouth every 4 (four) hours as needed (headache).   psyllium 58.6 % packet Commonly known as: METAMUCIL Take 1 packet by mouth daily.   QUEtiapine 400 MG 24 hr tablet Commonly known as: SEROQUEL XR Take 400 mg by mouth at bedtime.   solifenacin 10 MG tablet Commonly known as: VESICARE Take 10 mg by mouth daily.   traZODone 100 MG tablet Commonly known as: DESYREL Take 200 mg by mouth at bedtime.   venlafaxine 75 MG tablet Commonly known as: EFFEXOR Take 150 mg by mouth at bedtime.        Allergies:  Allergies  Allergen Reactions   Paclitaxel Anaphylaxis   Penicillins Other (See Comments)    Unknown Reaction when he was  an infant.    Past Medical History, Surgical history, Social history, and Family History were reviewed and updated.  Review of Systems: Review of Systems  Constitutional: Negative.   HENT: Negative.    Eyes: Negative.   Respiratory: Negative.    Cardiovascular: Negative.   Gastrointestinal: Negative.   Genitourinary: Negative.   Musculoskeletal: Negative.   Skin: Negative.   Neurological: Negative.   Endo/Heme/Allergies: Negative.   Psychiatric/Behavioral: Negative.        Physical Exam:  height is 5\' 10"  (1.778 m) and weight is 302 lb (137 kg) (abnormal). His oral temperature is 97.6 F (36.4 C). His blood pressure is 134/84 and his pulse is 64. His respiration is 19 and oxygen saturation is 95%.   Wt Readings from Last 3 Encounters:  09/22/22 (!) 302 lb (137 kg)  09/20/22  (!) 305 lb 6.4 oz (138.5 kg)  08/30/22 (!) 310 lb 1.6 oz (140.7 kg)    Physical Exam Vitals reviewed.  HENT:     Head: Normocephalic and atraumatic.  Eyes:     Pupils: Pupils are equal, round, and reactive to light.  Cardiovascular:     Rate and Rhythm: Normal rate and regular rhythm.     Heart sounds: Normal heart sounds.  Pulmonary:     Effort: Pulmonary effort is normal.     Breath sounds: Normal breath sounds.  Abdominal:     General: Bowel sounds are normal.     Palpations: Abdomen is soft.  Musculoskeletal:        General: No tenderness or deformity. Normal range of motion.     Cervical back: Normal range of motion.  Lymphadenopathy:     Cervical: No cervical adenopathy.  Skin:    General: Skin is warm and dry.     Findings: No erythema or rash.  Neurological:     Mental Status: He is alert and oriented to person, place, and time.  Psychiatric:        Behavior: Behavior normal.        Thought Content: Thought content normal.        Judgment: Judgment normal.     Lab Results  Component Value Date   WBC 5.6 09/22/2022   HGB 13.7 09/22/2022   HCT 41.1 09/22/2022   MCV 90.9 09/22/2022   PLT 158 09/22/2022   No results found for: "FERRITIN", "IRON", "TIBC", "UIBC", "IRONPCTSAT" Lab Results  Component Value Date   RBC 4.52 09/22/2022   No results found for: "KPAFRELGTCHN", "LAMBDASER", "KAPLAMBRATIO" No results found for: "IGGSERUM", "IGA", "IGMSERUM" No results found for: "TOTALPROTELP", "ALBUMINELP", "A1GS", "A2GS", "BETS", "BETA2SER", "GAMS", "MSPIKE", "SPEI"   Chemistry      Component Value Date/Time   NA 140 09/22/2022 0940   K 3.3 (L) 09/22/2022 0940   CL 105 09/22/2022 0940   CO2 26 09/22/2022 0940   BUN 11 09/22/2022 0940   CREATININE 1.14 09/22/2022 0940      Component Value Date/Time   CALCIUM 9.6 09/22/2022 0940   ALKPHOS 73 09/22/2022 0940   AST 16 09/22/2022 0940   ALT 19 09/22/2022 0940   BILITOT 0.4 09/22/2022 0940        Impression and Plan: Kevin Hunter is a very pleasant 57 yo caucasian gentleman with 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 He completed his chemotherapy along with immunotherapy.   He now is on maintenance therapy with nivolumab and tolerating well with a nice response.   He will need a PET scan probably  in a few weeks.  We will get this set up before his next appointment.  [name], MD 2/7/202410:42 AM

## 2022-09-22 NOTE — Patient Instructions (Signed)
[facility]  Discharge Instructions: Thank you for choosing [facility] to provide your oncology and hematology care.   If you have a lab appointment with the [facility], please go directly to the [facility] and check in at the registration area.  Wear comfortable clothing and clothing appropriate for easy access to any Portacath or PICC line.   We strive to give you quality time with your provider. You may need to reschedule your appointment if you arrive late (15 or more minutes).  Arriving late affects you and other patients whose appointments are after yours.  Also, if you miss three or more appointments without notifying the office, you may be dismissed from the clinic at the provider's discretion.      For prescription refill requests, have your pharmacy contact our office and allow 72 hours for refills to be completed.    Today you received the following chemotherapy and/or immunotherapy agents OPdivo      To help prevent nausea and vomiting after your treatment, we encourage you to take your nausea medication as directed.  BELOW ARE SYMPTOMS THAT SHOULD BE REPORTED IMMEDIATELY: *FEVER GREATER THAN 100.4 F (38 C) OR HIGHER *CHILLS OR SWEATING *NAUSEA AND VOMITING THAT IS NOT CONTROLLED WITH YOUR NAUSEA MEDICATION *UNUSUAL SHORTNESS OF BREATH *UNUSUAL BRUISING OR BLEEDING *URINARY PROBLEMS (pain or burning when urinating, or frequent urination) *BOWEL PROBLEMS (unusual diarrhea, constipation, pain near the anus) TENDERNESS IN MOUTH AND THROAT WITH OR WITHOUT PRESENCE OF ULCERS (sore throat, sores in mouth, or a toothache) UNUSUAL RASH, SWELLING OR PAIN  UNUSUAL VAGINAL DISCHARGE OR ITCHING   Items with * indicate a potential emergency and should be followed up as soon as possible or go to the Emergency Department if any problems should occur.  Please show the CHEMOTHERAPY ALERT CARD or IMMUNOTHERAPY ALERT CARD at check-in  to the Emergency Department and triage nurse. Should you have questions after your visit or need to cancel or reschedule your appointment, please contact [facility]  [phone] and follow the prompts.  Office hours are 8:00 a.m. to 4:30 p.m. Monday - Friday. Please note that voicemails left after 4:00 p.m. may not be returned until the following business day.  We are closed weekends and major holidays. You have access to a nurse at all times for urgent questions. Please call the main number to the clinic [phone] and follow the prompts.  For any non-urgent questions, you may also contact your provider using MyChart. We now offer e-Visits for anyone 64 and older to request care online for non-urgent symptoms. For details visit [email or web address].   Also download the MyChart app! Go to the app store, search "MyChart", open the app, select [facility], and log in with your MyChart username and password.

## 2022-09-24 LAB — T4: T4, Total: 5.7 ug/dL (ref 4.5–12.0)

## 2022-10-19 ENCOUNTER — Encounter: Payer: Self-pay | Admitting: Hematology & Oncology

## 2022-10-20 ENCOUNTER — Inpatient Hospital Stay: Payer: 59 | Admitting: Family

## 2022-10-20 ENCOUNTER — Encounter: Payer: Self-pay | Admitting: Family

## 2022-10-20 ENCOUNTER — Inpatient Hospital Stay: Payer: 59

## 2022-10-20 ENCOUNTER — Inpatient Hospital Stay: Payer: 59 | Attending: Internal Medicine

## 2022-10-20 VITALS — BP 132/86 | HR 56 | Resp 18

## 2022-10-20 DIAGNOSIS — C3411 Malignant neoplasm of upper lobe, right bronchus or lung: Secondary | ICD-10-CM | POA: Diagnosis present

## 2022-10-20 DIAGNOSIS — C3491 Malignant neoplasm of unspecified part of right bronchus or lung: Secondary | ICD-10-CM

## 2022-10-20 DIAGNOSIS — Z79620 Long term (current) use of immunosuppressive biologic: Secondary | ICD-10-CM | POA: Diagnosis not present

## 2022-10-20 DIAGNOSIS — C7931 Secondary malignant neoplasm of brain: Secondary | ICD-10-CM | POA: Insufficient documentation

## 2022-10-20 DIAGNOSIS — C7970 Secondary malignant neoplasm of unspecified adrenal gland: Secondary | ICD-10-CM | POA: Diagnosis not present

## 2022-10-20 DIAGNOSIS — Z5112 Encounter for antineoplastic immunotherapy: Secondary | ICD-10-CM | POA: Diagnosis present

## 2022-10-20 LAB — CBC WITH DIFFERENTIAL (CANCER CENTER ONLY)
Abs Immature Granulocytes: 0.02 10*3/uL (ref 0.00–0.07)
Basophils Absolute: 0.1 10*3/uL (ref 0.0–0.1)
Basophils Relative: 1 %
Eosinophils Absolute: 0.1 10*3/uL (ref 0.0–0.5)
Eosinophils Relative: 2 %
HCT: 40.2 % (ref 39.0–52.0)
Hemoglobin: 13.6 g/dL (ref 13.0–17.0)
Immature Granulocytes: 0 %
Lymphocytes Relative: 23 %
Lymphs Abs: 1.2 10*3/uL (ref 0.7–4.0)
MCH: 30.5 pg (ref 26.0–34.0)
MCHC: 33.8 g/dL (ref 30.0–36.0)
MCV: 90.1 fL (ref 80.0–100.0)
Monocytes Absolute: 0.5 10*3/uL (ref 0.1–1.0)
Monocytes Relative: 10 %
Neutro Abs: 3.5 10*3/uL (ref 1.7–7.7)
Neutrophils Relative %: 64 %
Platelet Count: 164 10*3/uL (ref 150–400)
RBC: 4.46 MIL/uL (ref 4.22–5.81)
RDW: 14.9 % (ref 11.5–15.5)
WBC Count: 5.4 10*3/uL (ref 4.0–10.5)
nRBC: 0 % (ref 0.0–0.2)

## 2022-10-20 LAB — CMP (CANCER CENTER ONLY)
ALT: 22 U/L (ref 0–44)
AST: 19 U/L (ref 15–41)
Albumin: 4.7 g/dL (ref 3.5–5.0)
Alkaline Phosphatase: 73 U/L (ref 38–126)
Anion gap: 8 (ref 5–15)
BUN: 13 mg/dL (ref 6–20)
CO2: 28 mmol/L (ref 22–32)
Calcium: 9.1 mg/dL (ref 8.9–10.3)
Chloride: 103 mmol/L (ref 98–111)
Creatinine: 1.14 mg/dL (ref 0.61–1.24)
GFR, Estimated: >60 mL/min (ref >60)
Glucose, Bld: 99 mg/dL (ref 70–99)
Potassium: 3.4 mmol/L — ABNORMAL LOW (ref 3.5–5.1)
Sodium: 139 mmol/L (ref 135–145)
Total Bilirubin: 0.4 mg/dL (ref 0.3–1.2)
Total Protein: 7.4 g/dL (ref 6.5–8.1)

## 2022-10-20 LAB — TSH: TSH: 1.336 u[IU]/mL (ref 0.350–4.500)

## 2022-10-20 MED ORDER — HEPARIN SOD (PORK) LOCK FLUSH 100 UNIT/ML IV SOLN
500.0000 [IU] | Freq: Once | INTRAVENOUS | Status: AC | PRN
  Administered 2022-10-20: 500 [IU]

## 2022-10-20 MED ORDER — SODIUM CHLORIDE 0.9 % IV SOLN: Freq: Once | INTRAVENOUS | Status: AC

## 2022-10-20 MED ORDER — SODIUM CHLORIDE 0.9 % IV SOLN
480.0000 mg | Freq: Once | INTRAVENOUS | Status: AC
  Administered 2022-10-20: 480 mg via INTRAVENOUS
  Filled 2022-10-20: qty 48

## 2022-10-20 MED ORDER — SODIUM CHLORIDE 0.9% FLUSH
10.0000 mL | INTRAVENOUS | Status: DC | PRN
  Administered 2022-10-20: 10 mL

## 2022-10-20 NOTE — Patient Instructions (Signed)
[facility]  Discharge Instructions: Thank you for choosing [facility] to provide your oncology and hematology care.   If you have a lab appointment with the [facility], please go directly to the [facility] and check in at the registration area.  Wear comfortable clothing and clothing appropriate for easy access to any Portacath or PICC line.   We strive to give you quality time with your provider. You may need to reschedule your appointment if you arrive late (15 or more minutes).  Arriving late affects you and other patients whose appointments are after yours.  Also, if you miss three or more appointments without notifying the office, you may be dismissed from the clinic at the provider's discretion.      For prescription refill requests, have your pharmacy contact our office and allow 72 hours for refills to be completed.    Today you received the following chemotherapy and/or immunotherapy agents Opdivo.      To help prevent nausea and vomiting after your treatment, we encourage you to take your nausea medication as directed.  BELOW ARE SYMPTOMS THAT SHOULD BE REPORTED IMMEDIATELY: *FEVER GREATER THAN 100.4 F (38 C) OR HIGHER *CHILLS OR SWEATING *NAUSEA AND VOMITING THAT IS NOT CONTROLLED WITH YOUR NAUSEA MEDICATION *UNUSUAL SHORTNESS OF BREATH *UNUSUAL BRUISING OR BLEEDING *URINARY PROBLEMS (pain or burning when urinating, or frequent urination) *BOWEL PROBLEMS (unusual diarrhea, constipation, pain near the anus) TENDERNESS IN MOUTH AND THROAT WITH OR WITHOUT PRESENCE OF ULCERS (sore throat, sores in mouth, or a toothache) UNUSUAL RASH, SWELLING OR PAIN  UNUSUAL VAGINAL DISCHARGE OR ITCHING   Items with * indicate a potential emergency and should be followed up as soon as possible or go to the Emergency Department if any problems should occur.  Please show the CHEMOTHERAPY ALERT CARD or IMMUNOTHERAPY ALERT CARD at check-in  to the Emergency Department and triage nurse. Should you have questions after your visit or need to cancel or reschedule your appointment, please contact [facility]  [phone] and follow the prompts.  Office hours are 8:00 a.m. to 4:30 p.m. Monday - Friday. Please note that voicemails left after 4:00 p.m. may not be returned until the following business day.  We are closed weekends and major holidays. You have access to a nurse at all times for urgent questions. Please call the main number to the clinic [phone] and follow the prompts.  For any non-urgent questions, you may also contact your provider using MyChart. We now offer e-Visits for anyone 28 and older to request care online for non-urgent symptoms. For details visit [email or web address].   Also download the MyChart app! Go to the app store, search "MyChart", open the app, select , and log in with your MyChart username and password.

## 2022-10-20 NOTE — Progress Notes (Signed)
Hematology and Oncology Follow Up Visit  Kevin Hunter [id number] 1966-05-21 57 y.o. 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 He is doing well at this time and has no complaints.  Imaging had contacted him several times and he has not called back to schedule his PET scan so we will give him their number to call back.  TSH last month was 1.318.  No issue with blood loss. Bruising or petechiae.  No fever, chills, n/v, cough, rash, dizziness, SOB, chest pain, palpitations, abdominal pain or changes in bowel or bladder habits.  No swelling, tenderness, numbness or tingling in his extremities at this time.  No falls or syncope.  Appetite and hydration are good. Weight is 292 lbs.   ECOG Performance Status: 1 - Symptomatic but completely ambulatory  Medications:  Allergies as of 10/20/2022       Reactions   Paclitaxel Anaphylaxis   Penicillins Other (See Comments)   Unknown Reaction when he was an infant.        Medication List        Accurate as of October 20, 2022  9:15 AM. If you have any questions, ask your nurse or doctor.          amLODipine 5 MG tablet Commonly known as: NORVASC Take 5 mg by mouth at bedtime.   clonazePAM 1 MG tablet Commonly known as: KLONOPIN Take 1 mg by mouth 2 (two) times daily as needed for anxiety (May take additional 0.'5mg'$  as needed for panic attack).   cyclobenzaprine 10 MG tablet Commonly known as: FLEXERIL Take 1 tablet (10 mg total) by mouth 3 (three) times  daily as needed for muscle spasms.   fluconazole 100 MG tablet Commonly known as: DIFLUCAN TAKE 1 TABLET BY MOUTH EVERY DAY   pantoprazole 40 MG tablet Commonly known as: PROTONIX TAKE 1 TABLET BY MOUTH TWICE A DAY   pseudoephedrine 30 MG tablet Commonly known as: SUDAFED Take 60 mg by mouth every 4 (four) hours as needed (headache).   psyllium 58.6 % packet Commonly known as: METAMUCIL Take 1 packet by mouth daily.   QUEtiapine 400 MG 24 hr tablet Commonly known as: SEROQUEL XR Take 400 mg by mouth at bedtime.   solifenacin 10 MG tablet Commonly known as: VESICARE Take 10 mg by mouth daily.   traZODone 100 MG tablet Commonly known as: DESYREL Take 200 mg by mouth at bedtime.   venlafaxine 75 MG tablet Commonly known as: EFFEXOR Take 150 mg by mouth at bedtime.        Allergies:  Allergies  Allergen Reactions   Paclitaxel Anaphylaxis   Penicillins Other (See Comments)    Unknown Reaction when he was an infant.    Past Medical History, Surgical history, Social history, and Family History were reviewed and updated.  Review of Systems: All other 10 point review of systems is negative.   Physical Exam:  height is '5\' 10"'$  (1.778 m) and weight is 292 lb 1.3 oz (  132.5 kg). His oral temperature is 98 F (36.7 C). His blood pressure is 128/80 and his pulse is 63. His respiration is 18 and oxygen saturation is 96%.   Wt Readings from Last 3 Encounters:  10/20/22 292 lb 1.3 oz (132.5 kg)  09/22/22 (!) 302 lb (137 kg)  09/20/22 (!) 305 lb 6.4 oz (138.5 kg)    Ocular: Sclerae unicteric, pupils equal, round and reactive to light Ear-nose-throat: Oropharynx clear, dentition fair Lymphatic: No cervical or supraclavicular adenopathy Lungs no rales or rhonchi, good excursion bilaterally Heart regular rate and rhythm, no murmur appreciated Abd soft, nontender, positive bowel sounds MSK no focal spinal tenderness, no joint edema Neuro: non-focal, well-oriented,  appropriate affect Breasts:   Lab Results  Component Value Date   WBC 5.4 10/20/2022   HGB 13.6 10/20/2022   HCT 40.2 10/20/2022   MCV 90.1 10/20/2022   PLT 164 10/20/2022   No results found for: "FERRITIN", "IRON", "TIBC", "UIBC", "IRONPCTSAT" Lab Results  Component Value Date   RBC 4.46 10/20/2022   No results found for: "KPAFRELGTCHN", "LAMBDASER", "KAPLAMBRATIO" No results found for: "IGGSERUM", "IGA", "IGMSERUM" No results found for: "TOTALPROTELP", "ALBUMINELP", "A1GS", "A2GS", "BETS", "BETA2SER", "GAMS", "MSPIKE", "SPEI"   Chemistry      Component Value Date/Time   NA 139 10/20/2022 0819   K 3.4 (L) 10/20/2022 0819   CL 103 10/20/2022 0819   CO2 28 10/20/2022 0819   BUN 13 10/20/2022 0819   CREATININE 1.14 10/20/2022 0819      Component Value Date/Time   CALCIUM 9.1 10/20/2022 0819   ALKPHOS 73 10/20/2022 0819   AST 19 10/20/2022 0819   ALT 22 10/20/2022 0819   BILITOT 0.4 10/20/2022 0819       Impression and Plan: Kevin Hunter is a very pleasant 57 yo caucasian gentleman with 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  He completed his chemo and immunotherapy.  He is now on maintenance therapy with Opdivo and doing well.  He will contact imaging today to schedule his PET scan.  Follow-up in 4 weeks.   Lottie Dawson, NP 3/6/20249:15 AM

## 2022-10-21 LAB — T4: T4, Total: 5.3 ug/dL (ref 4.5–12.0)

## 2022-10-29 ENCOUNTER — Encounter: Payer: Self-pay | Admitting: Hematology & Oncology

## 2022-11-10 ENCOUNTER — Telehealth: Payer: Self-pay | Admitting: Internal Medicine

## 2022-11-10 NOTE — Telephone Encounter (Signed)
Called patient regarding April appointments, left a voicemail.  

## 2022-11-11 ENCOUNTER — Other Ambulatory Visit (HOSPITAL_COMMUNITY): Payer: 59

## 2022-11-17 ENCOUNTER — Inpatient Hospital Stay: Payer: 59 | Admitting: Licensed Clinical Social Worker

## 2022-11-17 ENCOUNTER — Encounter: Payer: Self-pay | Admitting: Hematology & Oncology

## 2022-11-17 ENCOUNTER — Other Ambulatory Visit: Payer: Self-pay | Admitting: Oncology

## 2022-11-17 ENCOUNTER — Ambulatory Visit: Payer: 59

## 2022-11-17 ENCOUNTER — Inpatient Hospital Stay: Payer: 59 | Admitting: Hematology & Oncology

## 2022-11-17 ENCOUNTER — Inpatient Hospital Stay: Payer: 59

## 2022-11-17 ENCOUNTER — Ambulatory Visit: Payer: 59 | Admitting: Hematology & Oncology

## 2022-11-17 ENCOUNTER — Inpatient Hospital Stay: Payer: 59 | Attending: Internal Medicine

## 2022-11-17 VITALS — BP 128/77 | HR 66 | Temp 97.5°F | Resp 20 | Ht 70.0 in | Wt 287.1 lb

## 2022-11-17 VITALS — BP 136/77 | HR 59 | Resp 17

## 2022-11-17 DIAGNOSIS — Z79620 Long term (current) use of immunosuppressive biologic: Secondary | ICD-10-CM | POA: Insufficient documentation

## 2022-11-17 DIAGNOSIS — Z5112 Encounter for antineoplastic immunotherapy: Secondary | ICD-10-CM | POA: Insufficient documentation

## 2022-11-17 DIAGNOSIS — C3491 Malignant neoplasm of unspecified part of right bronchus or lung: Secondary | ICD-10-CM

## 2022-11-17 DIAGNOSIS — C7931 Secondary malignant neoplasm of brain: Secondary | ICD-10-CM | POA: Diagnosis not present

## 2022-11-17 DIAGNOSIS — C3411 Malignant neoplasm of upper lobe, right bronchus or lung: Secondary | ICD-10-CM | POA: Diagnosis present

## 2022-11-17 LAB — TSH: TSH: 1.465 u[IU]/mL (ref 0.350–4.500)

## 2022-11-17 LAB — CBC WITH DIFFERENTIAL (CANCER CENTER ONLY)
Abs Immature Granulocytes: 0.03 10*3/uL (ref 0.00–0.07)
Basophils Absolute: 0.1 10*3/uL (ref 0.0–0.1)
Basophils Relative: 1 %
Eosinophils Absolute: 0.1 10*3/uL (ref 0.0–0.5)
Eosinophils Relative: 1 %
HCT: 39.4 % (ref 39.0–52.0)
Hemoglobin: 13.5 g/dL (ref 13.0–17.0)
Immature Granulocytes: 1 %
Lymphocytes Relative: 22 %
Lymphs Abs: 1.3 10*3/uL (ref 0.7–4.0)
MCH: 30.9 pg (ref 26.0–34.0)
MCHC: 34.3 g/dL (ref 30.0–36.0)
MCV: 90.2 fL (ref 80.0–100.0)
Monocytes Absolute: 0.5 10*3/uL (ref 0.1–1.0)
Monocytes Relative: 9 %
Neutro Abs: 4 10*3/uL (ref 1.7–7.7)
Neutrophils Relative %: 66 %
Platelet Count: 162 10*3/uL (ref 150–400)
RBC: 4.37 MIL/uL (ref 4.22–5.81)
RDW: 14.6 % (ref 11.5–15.5)
WBC Count: 6 10*3/uL (ref 4.0–10.5)
nRBC: 0 % (ref 0.0–0.2)

## 2022-11-17 LAB — CMP (CANCER CENTER ONLY)
ALT: 15 U/L (ref 0–44)
AST: 14 U/L — ABNORMAL LOW (ref 15–41)
Albumin: 4.5 g/dL (ref 3.5–5.0)
Alkaline Phosphatase: 69 U/L (ref 38–126)
Anion gap: 9 (ref 5–15)
BUN: 18 mg/dL (ref 6–20)
CO2: 28 mmol/L (ref 22–32)
Calcium: 8.9 mg/dL (ref 8.9–10.3)
Chloride: 103 mmol/L (ref 98–111)
Creatinine: 1.18 mg/dL (ref 0.61–1.24)
GFR, Estimated: >60 mL/min (ref >60)
Glucose, Bld: 106 mg/dL — ABNORMAL HIGH (ref 70–99)
Potassium: 3.5 mmol/L (ref 3.5–5.1)
Sodium: 140 mmol/L (ref 135–145)
Total Bilirubin: 0.4 mg/dL (ref 0.3–1.2)
Total Protein: 7.2 g/dL (ref 6.5–8.1)

## 2022-11-17 LAB — LACTATE DEHYDROGENASE: LDH: 162 U/L (ref 98–192)

## 2022-11-17 MED ORDER — PANTOPRAZOLE SODIUM 40 MG PO TBEC: 40.0000 mg | DELAYED_RELEASE_TABLET | Freq: Two times a day (BID) | ORAL | 2 refills | Status: DC

## 2022-11-17 MED ORDER — SODIUM CHLORIDE 0.9% FLUSH
10.0000 mL | INTRAVENOUS | Status: DC | PRN
  Administered 2022-11-17: 10 mL

## 2022-11-17 MED ORDER — SODIUM CHLORIDE 0.9 % IV SOLN: Freq: Once | INTRAVENOUS | Status: AC

## 2022-11-17 MED ORDER — SODIUM CHLORIDE 0.9 % IV SOLN
480.0000 mg | Freq: Once | INTRAVENOUS | Status: AC
  Administered 2022-11-17: 480 mg via INTRAVENOUS
  Filled 2022-11-17: qty 48

## 2022-11-17 MED ORDER — HEPARIN SOD (PORK) LOCK FLUSH 100 UNIT/ML IV SOLN
500.0000 [IU] | Freq: Once | INTRAVENOUS | Status: AC | PRN
  Administered 2022-11-17: 500 [IU]

## 2022-11-17 NOTE — Patient Instructions (Signed)

## 2022-11-17 NOTE — Progress Notes (Signed)
[facility] CSW Progress Note  Holiday representative met with patient in infusion to assess needs.  Patient stated he was doing well and continues working full-time.  He has a follow-up appointment with [name] next week.  He displayed a bright affect and stated he has not noticed any cognitive changes.  No other needs.    Rodman Pickle Rosario Duey, LCSW

## 2022-11-17 NOTE — Patient Instructions (Signed)
[facility]  Discharge Instructions: Thank you for choosing [facility] to provide your oncology and hematology care.   If you have a lab appointment with the Cancer Center, please go directly to the Cancer Center and check in at the registration area.  Wear comfortable clothing and clothing appropriate for easy access to any Portacath or PICC line.   We strive to give you quality time with your provider. You may need to reschedule your appointment if you arrive late (15 or more minutes).  Arriving late affects you and other patients whose appointments are after yours.  Also, if you miss three or more appointments without notifying the office, you may be dismissed from the clinic at the provider's discretion.      For prescription refill requests, have your pharmacy contact our office and allow 72 hours for refills to be completed.    Today you received the following chemotherapy and/or immunotherapy agents Opdivo.      To help prevent nausea and vomiting after your treatment, we encourage you to take your nausea medication as directed.  BELOW ARE SYMPTOMS THAT SHOULD BE REPORTED IMMEDIATELY: *FEVER GREATER THAN 100.4 F (38 C) OR HIGHER *CHILLS OR SWEATING *NAUSEA AND VOMITING THAT IS NOT CONTROLLED WITH YOUR NAUSEA MEDICATION *UNUSUAL SHORTNESS OF BREATH *UNUSUAL BRUISING OR BLEEDING *URINARY PROBLEMS (pain or burning when urinating, or frequent urination) *BOWEL PROBLEMS (unusual diarrhea, constipation, pain near the anus) TENDERNESS IN MOUTH AND THROAT WITH OR WITHOUT PRESENCE OF ULCERS (sore throat, sores in mouth, or a toothache) UNUSUAL RASH, SWELLING OR PAIN  UNUSUAL VAGINAL DISCHARGE OR ITCHING   Items with * indicate a potential emergency and should be followed up as soon as possible or go to the Emergency Department if any problems should occur.  Please show the CHEMOTHERAPY ALERT CARD or IMMUNOTHERAPY ALERT CARD at check-in  to the Emergency Department and triage nurse. Should you have questions after your visit or need to cancel or reschedule your appointment, please contact [facility]  [phone] and follow the prompts.  Office hours are 8:00 a.m. to 4:30 p.m. Monday - Friday. Please note that voicemails left after 4:00 p.m. may not be returned until the following business day.  We are closed weekends and major holidays. You have access to a nurse at all times for urgent questions. Please call the main number to the clinic [phone] and follow the prompts.  For any non-urgent questions, you may also contact your provider using MyChart. We now offer e-Visits for anyone 18 and older to request care online for non-urgent symptoms. For details visit [email or web address].   Also download the MyChart app! Go to the app store, search "MyChart", open the app, select , and log in with your MyChart username and password.   

## 2022-11-17 NOTE — Progress Notes (Signed)
Hematology and Oncology Follow Up Visit  Kevin Hunter [id number] 08/02/1966 57 y.o. 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  He looks fantastic.  He had a very nice Easter weekend.  The pollen does bother him a little bit.  He has little bit of a cough with this.  He will have an MRI of the brain tomorrow.  He sees [name] next week.  He is working.  He is having no problems at work.  He does a lot of walking at work.  He has had no nausea or vomiting.  There has been no change in bowel or bladder habits.  He has had no rashes.  There is been no bleeding.  His last TSH back in March was 1.34.  He has had no fever.  There is been no leg swelling.  Overall, I would say his performance status is probably ECOG 0.   Medications:  Allergies as of 11/17/2022       Reactions   Paclitaxel Anaphylaxis   Penicillins Other (See Comments)   Unknown Reaction when he was an infant.        Medication List        Accurate as of November 17, 2022  8:11 AM. If you have any questions, ask your nurse or doctor.          amLODipine 5 MG tablet Commonly known as: NORVASC Take 5 mg by mouth at bedtime.   clonazePAM 1 MG tablet Commonly known as: KLONOPIN Take 1 mg by mouth 2 (two) times daily as needed for anxiety (May take additional 0.5mg  as needed for panic attack).   cyclobenzaprine 10 MG tablet Commonly known as: FLEXERIL Take 1 tablet (10 mg total) by mouth 3 (three) times daily as needed for  muscle spasms.   fluconazole 100 MG tablet Commonly known as: DIFLUCAN TAKE 1 TABLET BY MOUTH EVERY DAY   pantoprazole 40 MG tablet Commonly known as: PROTONIX TAKE 1 TABLET BY MOUTH TWICE A DAY   pseudoephedrine 30 MG tablet Commonly known as: SUDAFED Take 60 mg by mouth every 4 (four) hours as needed (headache).   psyllium 58.6 % packet Commonly known as: METAMUCIL Take 1 packet by mouth daily.   QUEtiapine 400 MG 24 hr tablet Commonly known as: SEROQUEL XR Take 400 mg by mouth at bedtime.   sildenafil 20 MG tablet Commonly known as: REVATIO 2-5 tablets Orally daily as needed for 90 days   solifenacin 10 MG tablet Commonly known as: VESICARE Take 10 mg by mouth daily.   traZODone 100 MG tablet Commonly known as: DESYREL Take 200 mg by mouth at bedtime.   venlafaxine 75 MG tablet Commonly known as: EFFEXOR Take 150 mg by mouth at bedtime.        Allergies:  Allergies  Allergen Reactions   Paclitaxel Anaphylaxis   Penicillins Other (See Comments)    Unknown Reaction when he was an infant.    Past Medical History, Surgical history,  Social history, and Family History were reviewed and updated.  Review of Systems: Review of Systems  Constitutional: Negative.   HENT: Negative.    Eyes: Negative.   Respiratory: Negative.    Cardiovascular: Negative.   Gastrointestinal: Negative.   Genitourinary: Negative.   Musculoskeletal: Negative.   Skin: Negative.   Neurological: Negative.   Endo/Heme/Allergies: Negative.   Psychiatric/Behavioral: Negative.        Physical Exam:  height is 5\' 10"  (1.778 m) and weight is 287 lb 1.9 oz (130.2 kg). His oral temperature is 97.5 F (36.4 C) (abnormal). His blood pressure is 128/77 and his pulse is 66. His respiration is 20 and oxygen saturation is 93%.   Wt Readings from Last 3 Encounters:  11/17/22 287 lb 1.9 oz (130.2 kg)  10/20/22 292 lb 1.3 oz (132.5 kg)  09/22/22 (!) 302 lb (137 kg)    Physical  Exam Vitals reviewed.  HENT:     Head: Normocephalic and atraumatic.  Eyes:     Pupils: Pupils are equal, round, and reactive to light.  Cardiovascular:     Rate and Rhythm: Normal rate and regular rhythm.     Heart sounds: Normal heart sounds.  Pulmonary:     Effort: Pulmonary effort is normal.     Breath sounds: Normal breath sounds.  Abdominal:     General: Bowel sounds are normal.     Palpations: Abdomen is soft.  Musculoskeletal:        General: No tenderness or deformity. Normal range of motion.     Cervical back: Normal range of motion.  Lymphadenopathy:     Cervical: No cervical adenopathy.  Skin:    General: Skin is warm and dry.     Findings: No erythema or rash.  Neurological:     Mental Status: He is alert and oriented to person, place, and time.  Psychiatric:        Behavior: Behavior normal.        Thought Content: Thought content normal.        Judgment: Judgment normal.     Lab Results  Component Value Date   WBC 5.4 10/20/2022   HGB 13.6 10/20/2022   HCT 40.2 10/20/2022   MCV 90.1 10/20/2022   PLT 164 10/20/2022   No results found for: "FERRITIN", "IRON", "TIBC", "UIBC", "IRONPCTSAT" Lab Results  Component Value Date   RBC 4.46 10/20/2022   No results found for: "KPAFRELGTCHN", "LAMBDASER", "KAPLAMBRATIO" No results found for: "IGGSERUM", "IGA", "IGMSERUM" No results found for: "TOTALPROTELP", "ALBUMINELP", "A1GS", "A2GS", "BETS", "BETA2SER", "GAMS", "MSPIKE", "SPEI"   Chemistry      Component Value Date/Time   NA 139 10/20/2022 0819   K 3.4 (L) 10/20/2022 0819   CL 103 10/20/2022 0819   CO2 28 10/20/2022 0819   BUN 13 10/20/2022 0819   CREATININE 1.14 10/20/2022 0819      Component Value Date/Time   CALCIUM 9.1 10/20/2022 0819   ALKPHOS 73 10/20/2022 0819   AST 19 10/20/2022 0819   ALT 22 10/20/2022 0819   BILITOT 0.4 10/20/2022 0819       Impression and Plan: Kevin Hunter is a very pleasant 57 yo caucasian gentleman with stage IV  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 He completed his chemotherapy along with immunotherapy.   He now is on maintenance therapy with nivolumab and tolerating well with a nice response.   Hopefully, the insurance company will approve his PET scan.  We really need to have this done  to see how things are going.  If everything looks fine, then we might be able to move his appointments out a little bit longer.  Hopefully, his MRI of the brain will also turn out well.  Will plan to see him back in 4 weeks. Marland Kitchen  [name], MD 4/3/20248:11 AM

## 2022-11-18 ENCOUNTER — Ambulatory Visit
Admission: RE | Admit: 2022-11-18 | Discharge: 2022-11-18 | Disposition: A | Discharge status: Home / Self Care | Payer: 59 | Source: Ambulatory Visit | Attending: Internal Medicine | Admitting: Internal Medicine

## 2022-11-18 ENCOUNTER — Other Ambulatory Visit: Payer: Self-pay

## 2022-11-18 DIAGNOSIS — C3490 Malignant neoplasm of unspecified part of unspecified bronchus or lung: Secondary | ICD-10-CM

## 2022-11-18 LAB — T4: T4, Total: 5.7 ug/dL (ref 4.5–12.0)

## 2022-11-18 MED ORDER — GADOPICLENOL 0.5 MMOL/ML IV SOLN
10.0000 mL | Freq: Once | INTRAVENOUS | Status: AC | PRN
  Administered 2022-11-18: 10 mL via INTRAVENOUS

## 2022-11-22 ENCOUNTER — Inpatient Hospital Stay: Payer: 59

## 2022-11-23 ENCOUNTER — Inpatient Hospital Stay: Payer: 59 | Attending: Hematology & Oncology | Admitting: Internal Medicine

## 2022-11-23 VITALS — BP 120/81 | HR 78 | Temp 98.1°F | Resp 20 | Wt 287.3 lb

## 2022-11-23 DIAGNOSIS — C3490 Malignant neoplasm of unspecified part of unspecified bronchus or lung: Secondary | ICD-10-CM | POA: Diagnosis not present

## 2022-11-23 DIAGNOSIS — C3491 Malignant neoplasm of unspecified part of right bronchus or lung: Secondary | ICD-10-CM | POA: Diagnosis present

## 2022-11-23 DIAGNOSIS — C7931 Secondary malignant neoplasm of brain: Secondary | ICD-10-CM | POA: Diagnosis present

## 2022-11-23 NOTE — Progress Notes (Signed)
[facility] [address]  [place], [place] [address] [phone]   Interval Evaluation  Date of Service: 11/23/22 Patient Name: Kevin ShellJohn D Ceniceros Patient MRN: [id number] Patient DOB: 08/01/1966 Provider: Henreitta LeberZachary K Amarachukwu Lakatos, MD  Identifying Statement:  Kevin ShellJohn D Simone is a 57 y.o. male with Non-small cell lung cancer metastatic to brain   Primary Cancer:  Oncologic History: Oncology History  Lung cancer, primary, with metastasis from lung to other site, right  05/27/2021 Initial Diagnosis   Lung cancer, primary, with metastasis from lung to other site, right [facility](HCC)   05/27/2021 Cancer Staging   Staging form: Lung, AJCC 8th Edition - Clinical stage from 05/27/2021: Stage IV (cT3, cN2, pM1) - Signed by Josph MachoEnnever, Peter R, MD on 05/27/2021 Histologic grade (G): G2 Histologic grading system: 4 grade system   06/25/2021 - 07/14/2021 Chemotherapy   Patient is on Treatment Plan : LUNG NSCLC SQUAMOUS Nivolumab + Ipilimumab + Carboplatin + Paclitaxel q42d X 1 cycle / Nivolumab + Ipilimumab q42d     07/22/2021 - 10/16/2021 Chemotherapy   Patient is on Treatment Plan : LUNG Carboplatin / Docetaxel q21d     07/22/2021 - 03/17/2022 Chemotherapy   Patient is on Treatment Plan : LUNG Nivolumab q21d     07/22/2021 -  Chemotherapy   Patient is on Treatment Plan : LUNG Nivolumab (240) q14d      CNS Oncologic History 06/12/21: SRS x3 Mitzi Hansen([place])  Interval History: Kevin ShellJohn D Merlos presents today for follow up after recent MRI brain.  Denies new or progressive clinical changes today, no return of left sided weakness.  Denies speech issues.  Has been off decadron.  Continues on opdivo with Dr. Myna HidalgoEnnever.    H+P (02/08/22) Patient presents today for follow up after recent neurologic symptoms.  He describes numbness and weakness of his left arm and leg, progressive over several weeks.  He began dragging the left leg, interfering with ambulation.  Also describes issues  with cognition, short term memory.  Morning headaches and blurry vision have accompanied these changes.  Decadron was started late last week, this has led to significantly improved symptom burden.  He feels close to his baseline at this time.  Continues on opdivo with Dr. Myna HidalgoEnnever.    Medications: Current Outpatient Medications on File Prior to Visit  Medication Sig Dispense Refill   amLODipine (NORVASC) 5 MG tablet Take 5 mg by mouth at bedtime.     clonazePAM (KLONOPIN) 1 MG tablet Take 1 mg by mouth 2 (two) times daily as needed for anxiety (May take additional 0.5mg  as needed for panic attack).     cyclobenzaprine (FLEXERIL) 10 MG tablet Take 1 tablet (10 mg total) by mouth 3 (three) times daily as needed for muscle spasms. 60 tablet 1   fluconazole (DIFLUCAN) 100 MG tablet TAKE 1 TABLET BY MOUTH EVERY DAY (Patient not taking: Reported on 11/17/2022) 30 tablet 5   pantoprazole (PROTONIX) 40 MG tablet Take 1 tablet (40 mg total) by mouth 2 (two) times daily. 180 tablet 2   pseudoephedrine (SUDAFED) 30 MG tablet Take 60 mg by mouth every 4 (four) hours as needed (headache).     psyllium (METAMUCIL) 58.6 % packet Take 1 packet by mouth daily.     QUEtiapine (SEROQUEL XR) 400 MG 24 hr tablet Take 400 mg by mouth at bedtime.     sildenafil (REVATIO) 20 MG tablet 2-5 tablets Orally daily as needed for 90 days     solifenacin (VESICARE)  10 MG tablet Take 10 mg by mouth daily.     traZODone (DESYREL) 100 MG tablet Take 200 mg by mouth at bedtime.     venlafaxine (EFFEXOR) 75 MG tablet Take 150 mg by mouth at bedtime.     [DISCONTINUED] prochlorperazine (COMPAZINE) 10 MG tablet Take 1 tablet (10 mg total) by mouth every 6 (six) hours as needed (Nausea or vomiting). 30 tablet 1   No current facility-administered medications on file prior to visit.    Allergies:  Allergies  Allergen Reactions   Paclitaxel Anaphylaxis   Penicillins Other (See Comments)    Unknown Reaction when he was an infant.    Past Medical History:  Past Medical History:  Diagnosis Date   Anxiety    Bipolar 1 disorder    Goals of care, counseling/discussion 05/27/2021   Hypertension    Lung cancer, primary, with metastasis from lung to other site, right 05/27/2021   Mood disorder    Sleep apnea    Past Surgical History:  Past Surgical History:  Procedure Laterality Date   BRONCHIAL BIOPSY  05/22/2021   Procedure: BRONCHIAL BIOPSIES;  Surgeon: Josephine Igo, DO;  Location: [facility];  Service: Pulmonary;;   BRONCHIAL BRUSHINGS  05/22/2021   Procedure: BRONCHIAL BRUSHINGS;  Surgeon: Josephine Igo, DO;  Location: [facility];  Service: Pulmonary;;   BRONCHIAL NEEDLE ASPIRATION BIOPSY  05/22/2021   Procedure: BRONCHIAL NEEDLE ASPIRATION BIOPSIES;  Surgeon: Josephine Igo, DO;  Location: [facility];  Service: Pulmonary;;   IR IMAGING GUIDED PORT INSERTION  06/24/2021   VIDEO BRONCHOSCOPY WITH ENDOBRONCHIAL NAVIGATION N/A 05/22/2021   Procedure: VIDEO BRONCHOSCOPY WITH ENDOBRONCHIAL NAVIGATION;  Surgeon: Josephine Igo, DO;  Location: [facility];  Service: Pulmonary;  Laterality: N/A;   VIDEO BRONCHOSCOPY WITH RADIAL ENDOBRONCHIAL ULTRASOUND  05/22/2021   Procedure: RADIAL ENDOBRONCHIAL ULTRASOUND;  Surgeon: Josephine Igo, DO;  Location: [facility];  Service: Pulmonary;;   Social History:  Social History   Socioeconomic History   Marital status: Single    Spouse name: Not on file   Number of children: Not on file   Years of education: Not on file   Highest education level: Not on file  Occupational History   Not on file  Tobacco Use   Smoking status: Former    Packs/day: 1.50    Years: 30.00    Additional pack years: 0.00    Total pack years: 45.00    Types: Cigarettes, E-cigarettes    Start date: 05/16/2018    Quit date: 05/16/2018    Years since quitting: 4.5   Smokeless tobacco: Never  Vaping Use   Vaping Use: Never used  Substance and Sexual Activity   Alcohol use: Never    Drug use: Never   Sexual activity: Not on file  Other Topics Concern   Not on file  Social History Narrative   Not on file   Social Determinants of Health   Financial Resource Strain: Low Risk  (09/22/2022)   Overall Financial Resource Strain (CARDIA)    Difficulty of Paying Living Expenses: Not hard at all  Food Insecurity: No Food Insecurity (09/22/2022)   Hunger Vital Sign    Worried About Running Out of Food in the Last Year: Never true    Ran Out of Food in the Last Year: Never true  Transportation Needs: No Transportation Needs (09/22/2022)   PRAPARE - Administrator, Civil Service (Medical): No    Lack of Transportation (Non-Medical): No  Physical Activity: Not on file  Stress: Not on file  Social Connections: Not on file  Intimate Partner Violence: Not At Risk (05/28/2021)   Humiliation, Afraid, Rape, and Kick questionnaire    Fear of Current or Ex-Partner: No    Emotionally Abused: No    Physically Abused: No    Sexually Abused: No   Family History:  Family History  Problem Relation Age of Onset   Breast cancer Mother    Lung cancer Father     Review of Systems: Constitutional: Doesn't report fevers, chills or abnormal weight loss Eyes: Doesn't report blurriness of vision Ears, nose, mouth, throat, and face: Doesn't report sore throat Respiratory: Doesn't report cough, dyspnea or wheezes Cardiovascular: Doesn't report palpitation, chest discomfort  Gastrointestinal:  Doesn't report nausea, constipation, diarrhea GU: Doesn't report incontinence Skin: Doesn't report skin rashes Neurological: Per HPI Musculoskeletal: Doesn't report joint pain Behavioral/Psych: Doesn't report anxiety  Physical Exam: Vitals:   11/23/22 1055  BP: 120/81  Pulse: 78  Resp: 20  Temp: 98.1 F (36.7 C)  SpO2: 94%   KPS: 90. General: Alert, cooperative, pleasant, in no acute distress Head: Normal EENT: No conjunctival injection or scleral icterus.  Lungs: Resp  effort normal Cardiac: Regular rate Abdomen: Non-distended abdomen Skin: No rashes cyanosis or petechiae. Extremities: No clubbing or edema  Neurologic Exam: Mental Status: Awake, alert, attentive to examiner. Oriented to self and environment. Language is fluent with intact comprehension.  Cranial Nerves: Visual acuity is grossly normal. Visual fields are full. Extra-ocular movements intact. No ptosis. Face is symmetric Motor: Tone and bulk are normal. Power is 5/5 in left arm and leg. Reflexes are symmetric, no pathologic reflexes present.  Sensory: Intact to light touch Gait: Independent   Labs: I have reviewed the data as listed    Component Value Date/Time   NA 140 11/17/2022 0800   K 3.5 11/17/2022 0800   CL 103 11/17/2022 0800   CO2 28 11/17/2022 0800   GLUCOSE 106 (H) 11/17/2022 0800   BUN 18 11/17/2022 0800   CREATININE 1.18 11/17/2022 0800   CALCIUM 8.9 11/17/2022 0800   PROT 7.2 11/17/2022 0800   ALBUMIN 4.5 11/17/2022 0800   AST 14 (L) 11/17/2022 0800   ALT 15 11/17/2022 0800   ALKPHOS 69 11/17/2022 0800   BILITOT 0.4 11/17/2022 0800   GFRNONAA >60 11/17/2022 0800   Lab Results  Component Value Date   WBC 6.0 11/17/2022   NEUTROABS 4.0 11/17/2022   HGB 13.5 11/17/2022   HCT 39.4 11/17/2022   MCV 90.2 11/17/2022   PLT 162 11/17/2022    Imaging:  [facility] Clinician Interpretation: I have personally reviewed the CNS images as listed.  My interpretation, in the context of the patient's clinical presentation, is stable disease  MR BRAIN W WO CONTRAST  Result Date: 11/22/2022 CLINICAL DATA:  Brain/CNS neoplasm, assess treatment response EXAM: MRI HEAD WITHOUT AND WITH CONTRAST TECHNIQUE: Multiplanar, multiecho pulse sequences of the brain and surrounding structures were obtained without and with intravenous contrast. CONTRAST:  10 mL of Vueway COMPARISON:  MRI head 08/26/2022. FINDINGS: Brain: Mildly decreased size of the enhancing right frontal lesion with similar  collapse of the cystic component. This lesion now measures approximately 2.0 x 2.1 cm on series 13, image 141. Also, mildly decreased size of the left frontal lesion which now measures 2.1 x 1.9 cm series 13, image 111. Mildly decreased size of the extent the left parietal lesion which measures 2.7 x 1.9 cm on series 13,  image 111. For all lesions, mild decrease in extent of surrounding T2/FLAIR edema. No new lesions identified. No evidence of acute infarct, acute hemorrhage or progressive mass effect. Vascular: Major arterial flow voids are maintained at the skull base. Skull and upper cervical spine: Normal marrow signal. Similar lesion at the vertex. Sinuses/Orbits: Negative. IMPRESSION: Mildly decreased size of right frontal and left frontal and parietal metastases with mildly improved edema. No new lesions identified. Electronically Signed   By: Feliberto Harts M.D.   On: 11/22/2022 09:15     Assessment/Plan Non-small cell lung cancer metastatic to brain  Kevin Hunter is clinically stable today.  MRI brain demonstrates improvement in burden of enhancement and T2/FLAIR signal with both treated lesions, consistent with radionecrosis.    Decadron should remain off, if tolerated.  Dr. Myna Hidalgo will con't to follow.  We ask that Kevin Hunter return to clinic in 4 months with MRI brain, or sooner as needed.  All questions were answered. The patient knows to call the clinic with any problems, questions or concerns. No barriers to learning were detected.  The total time spent in the encounter was 30 minutes and more than 50% was on counseling and review of test results   Henreitta Leber, MD Medical Director of Neuro-Oncology [facility] 11/23/22 10:54 AM

## 2022-11-26 ENCOUNTER — Other Ambulatory Visit: Payer: Self-pay

## 2022-12-14 ENCOUNTER — Encounter: Payer: Self-pay | Admitting: *Deleted

## 2022-12-15 ENCOUNTER — Other Ambulatory Visit: Payer: Self-pay

## 2022-12-15 ENCOUNTER — Inpatient Hospital Stay: Payer: 59

## 2022-12-15 ENCOUNTER — Encounter: Payer: Self-pay | Admitting: Hematology & Oncology

## 2022-12-15 ENCOUNTER — Inpatient Hospital Stay: Payer: 59 | Admitting: Hematology & Oncology

## 2022-12-15 ENCOUNTER — Inpatient Hospital Stay: Payer: 59 | Attending: Internal Medicine

## 2022-12-15 VITALS — BP 137/87 | HR 61 | Temp 98.0°F | Resp 18 | Ht 70.0 in | Wt 289.1 lb

## 2022-12-15 DIAGNOSIS — C7931 Secondary malignant neoplasm of brain: Secondary | ICD-10-CM | POA: Diagnosis not present

## 2022-12-15 DIAGNOSIS — Z5112 Encounter for antineoplastic immunotherapy: Secondary | ICD-10-CM | POA: Diagnosis present

## 2022-12-15 DIAGNOSIS — Z79620 Long term (current) use of immunosuppressive biologic: Secondary | ICD-10-CM | POA: Insufficient documentation

## 2022-12-15 DIAGNOSIS — C3411 Malignant neoplasm of upper lobe, right bronchus or lung: Secondary | ICD-10-CM | POA: Insufficient documentation

## 2022-12-15 DIAGNOSIS — C3491 Malignant neoplasm of unspecified part of right bronchus or lung: Secondary | ICD-10-CM

## 2022-12-15 LAB — CBC WITH DIFFERENTIAL (CANCER CENTER ONLY)
Abs Immature Granulocytes: 0.03 10*3/uL (ref 0.00–0.07)
Basophils Absolute: 0.1 10*3/uL (ref 0.0–0.1)
Basophils Relative: 1 %
Eosinophils Absolute: 0.1 10*3/uL (ref 0.0–0.5)
Eosinophils Relative: 1 %
HCT: 40.3 % (ref 39.0–52.0)
Hemoglobin: 14.1 g/dL (ref 13.0–17.0)
Immature Granulocytes: 0 %
Lymphocytes Relative: 18 %
Lymphs Abs: 1.3 10*3/uL (ref 0.7–4.0)
MCH: 31.7 pg (ref 26.0–34.0)
MCHC: 35 g/dL (ref 30.0–36.0)
MCV: 90.6 fL (ref 80.0–100.0)
Monocytes Absolute: 0.5 10*3/uL (ref 0.1–1.0)
Monocytes Relative: 8 %
Neutro Abs: 5.1 10*3/uL (ref 1.7–7.7)
Neutrophils Relative %: 72 %
Platelet Count: 157 10*3/uL (ref 150–400)
RBC: 4.45 MIL/uL (ref 4.22–5.81)
RDW: 14.3 % (ref 11.5–15.5)
WBC Count: 7 10*3/uL (ref 4.0–10.5)
nRBC: 0 % (ref 0.0–0.2)

## 2022-12-15 LAB — CMP (CANCER CENTER ONLY)
ALT: 16 U/L (ref 0–44)
AST: 14 U/L — ABNORMAL LOW (ref 15–41)
Albumin: 4.3 g/dL (ref 3.5–5.0)
Alkaline Phosphatase: 78 U/L (ref 38–126)
Anion gap: 6 (ref 5–15)
BUN: 14 mg/dL (ref 6–20)
CO2: 29 mmol/L (ref 22–32)
Calcium: 9.1 mg/dL (ref 8.9–10.3)
Chloride: 104 mmol/L (ref 98–111)
Creatinine: 1.26 mg/dL — ABNORMAL HIGH (ref 0.61–1.24)
GFR, Estimated: >60 mL/min (ref >60)
Glucose, Bld: 96 mg/dL (ref 70–99)
Potassium: 3.5 mmol/L (ref 3.5–5.1)
Sodium: 139 mmol/L (ref 135–145)
Total Bilirubin: 0.3 mg/dL (ref 0.3–1.2)
Total Protein: 7.4 g/dL (ref 6.5–8.1)

## 2022-12-15 LAB — TSH: TSH: 1.892 u[IU]/mL (ref 0.350–4.500)

## 2022-12-15 MED ORDER — SODIUM CHLORIDE 0.9% FLUSH
10.0000 mL | INTRAVENOUS | Status: DC | PRN
  Administered 2022-12-15: 10 mL

## 2022-12-15 MED ORDER — HEPARIN SOD (PORK) LOCK FLUSH 100 UNIT/ML IV SOLN
500.0000 [IU] | Freq: Once | INTRAVENOUS | Status: AC | PRN
  Administered 2022-12-15: 500 [IU]

## 2022-12-15 MED ORDER — SODIUM CHLORIDE 0.9 % IV SOLN: Freq: Once | INTRAVENOUS | Status: DC

## 2022-12-15 MED ORDER — SODIUM CHLORIDE 0.9 % IV SOLN
480.0000 mg | Freq: Once | INTRAVENOUS | Status: AC
  Administered 2022-12-15: 480 mg via INTRAVENOUS
  Filled 2022-12-15: qty 48

## 2022-12-15 MED ORDER — SODIUM CHLORIDE 0.9 % IV SOLN: Freq: Once | INTRAVENOUS | Status: AC

## 2022-12-15 NOTE — Patient Instructions (Signed)
[facility]  Discharge Instructions: Thank you for choosing [facility] to provide your oncology and hematology care.   If you have a lab appointment with the Cancer Center, please go directly to the Cancer Center and check in at the registration area.  Wear comfortable clothing and clothing appropriate for easy access to any Portacath or PICC line.   We strive to give you quality time with your provider. You may need to reschedule your appointment if you arrive late (15 or more minutes).  Arriving late affects you and other patients whose appointments are after yours.  Also, if you miss three or more appointments without notifying the office, you may be dismissed from the clinic at the provider's discretion.      For prescription refill requests, have your pharmacy contact our office and allow 72 hours for refills to be completed.    Today you received the following chemotherapy and/or immunotherapy agents Opdivo.      To help prevent nausea and vomiting after your treatment, we encourage you to take your nausea medication as directed.  BELOW ARE SYMPTOMS THAT SHOULD BE REPORTED IMMEDIATELY: *FEVER GREATER THAN 100.4 F (38 C) OR HIGHER *CHILLS OR SWEATING *NAUSEA AND VOMITING THAT IS NOT CONTROLLED WITH YOUR NAUSEA MEDICATION *UNUSUAL SHORTNESS OF BREATH *UNUSUAL BRUISING OR BLEEDING *URINARY PROBLEMS (pain or burning when urinating, or frequent urination) *BOWEL PROBLEMS (unusual diarrhea, constipation, pain near the anus) TENDERNESS IN MOUTH AND THROAT WITH OR WITHOUT PRESENCE OF ULCERS (sore throat, sores in mouth, or a toothache) UNUSUAL RASH, SWELLING OR PAIN  UNUSUAL VAGINAL DISCHARGE OR ITCHING   Items with * indicate a potential emergency and should be followed up as soon as possible or go to the Emergency Department if any problems should occur.  Please show the CHEMOTHERAPY ALERT CARD or IMMUNOTHERAPY ALERT CARD at check-in  to the Emergency Department and triage nurse. Should you have questions after your visit or need to cancel or reschedule your appointment, please contact [facility]  [phone] and follow the prompts.  Office hours are 8:00 a.m. to 4:30 p.m. Monday - Friday. Please note that voicemails left after 4:00 p.m. may not be returned until the following business day.  We are closed weekends and major holidays. You have access to a nurse at all times for urgent questions. Please call the main number to the clinic [phone] and follow the prompts.  For any non-urgent questions, you may also contact your provider using MyChart. We now offer e-Visits for anyone 18 and older to request care online for non-urgent symptoms. For details visit [email or web address].   Also download the MyChart app! Go to the app store, search "MyChart", open the app, select [facility], and log in with your MyChart username and password.   

## 2022-12-15 NOTE — Patient Instructions (Signed)

## 2022-12-15 NOTE — Progress Notes (Signed)
Hematology and Oncology Follow Up Visit  Kevin Hunter [id number] 03/11/1966 57 y.o. 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  I saw him he is doing quite well.  He really has no complaints.  He is still working.  I am very impressed with the fact that he does work.  He has had no problems with nausea or vomiting.  There is no headache.  He has had no cough or shortness of breath.  He has had no change in bowel or bladder habits.  There has been no leg swelling.  He has had no fever.  Thankfully, he is avoided COVID.  He has had no bleeding.  There is been no rashes.  He has been outside.  Again he has been quite active.  I am very impressed by the fact that he does work.  He had an MRI of the brain on 11/18/2022.  This showed decrease in the CNS metastasis.  There is decreased edema.  His last TSH was 1.45.  Overall, his performance status is ECOG 0.    Medications:  Allergies as of 12/15/2022       Reactions   Paclitaxel Anaphylaxis   Penicillins Other (See Comments)   Unknown Reaction when he was an infant.        Medication List        Accurate as of Dec 15, 2022  8:06 AM. If you have any questions, ask your nurse or doctor.          amLODipine 5 MG tablet Commonly known as: NORVASC Take 5 mg by mouth at bedtime.   clonazePAM 1 MG tablet Commonly known as: KLONOPIN Take 1 mg by mouth 2 (two) times daily as needed for anxiety (May take additional 0.5mg  as needed for panic attack).    cyclobenzaprine 10 MG tablet Commonly known as: FLEXERIL Take 1 tablet (10 mg total) by mouth 3 (three) times daily as needed for muscle spasms.   fluconazole 100 MG tablet Commonly known as: DIFLUCAN TAKE 1 TABLET BY MOUTH EVERY DAY   pantoprazole 40 MG tablet Commonly known as: PROTONIX Take 1 tablet (40 mg total) by mouth 2 (two) times daily.   pseudoephedrine 30 MG tablet Commonly known as: SUDAFED Take 60 mg by mouth every 4 (four) hours as needed (headache).   psyllium 58.6 % packet Commonly known as: METAMUCIL Take 1 packet by mouth daily.   QUEtiapine 400 MG 24 hr tablet Commonly known as: SEROQUEL XR Take 400 mg by mouth at bedtime.   sildenafil 20 MG tablet Commonly known as: REVATIO 2-5 tablets Orally daily as needed for 90 days   solifenacin 10 MG tablet Commonly known as: VESICARE Take 10 mg by mouth daily.   traZODone 100 MG tablet Commonly known as: DESYREL Take 200 mg by mouth at bedtime.   venlafaxine 75 MG tablet Commonly known as: EFFEXOR Take 150 mg by mouth at bedtime.        Allergies:  Allergies  Allergen Reactions   Paclitaxel Anaphylaxis   Penicillins Other (See Comments)    Unknown Reaction when he was an infant.    Past Medical History, Surgical history, Social history, and Family History were reviewed and updated.  Review of Systems: Review of Systems  Constitutional: Negative.   HENT: Negative.    Eyes: Negative.   Respiratory: Negative.    Cardiovascular: Negative.   Gastrointestinal: Negative.   Genitourinary: Negative.   Musculoskeletal: Negative.   Skin: Negative.   Neurological:  Positive for tingling.  Endo/Heme/Allergies: Negative.   Psychiatric/Behavioral: Negative.        Physical Exam: Vital signs are temperature of 98.  Pulse 61.  Blood pressure 137/87.  Weight is 289 pounds  Wt Readings from Last 3 Encounters:  11/23/22 287 lb 4.8 oz (130.3 kg)  11/17/22 287 lb 1.9 oz (130.2 kg)  10/20/22 292 lb  1.3 oz (132.5 kg)    Physical Exam Vitals reviewed.  HENT:     Head: Normocephalic and atraumatic.  Eyes:     Pupils: Pupils are equal, round, and reactive to light.  Cardiovascular:     Rate and Rhythm: Normal rate and regular rhythm.     Heart sounds: Normal heart sounds.  Pulmonary:     Effort: Pulmonary effort is normal.     Breath sounds: Normal breath sounds.  Abdominal:     General: Bowel sounds are normal.     Palpations: Abdomen is soft.  Musculoskeletal:        General: No tenderness or deformity. Normal range of motion.     Cervical back: Normal range of motion.  Lymphadenopathy:     Cervical: No cervical adenopathy.  Skin:    General: Skin is warm and dry.     Findings: No erythema or rash.  Neurological:     Mental Status: He is alert and oriented to person, place, and time.  Psychiatric:        Behavior: Behavior normal.        Thought Content: Thought content normal.        Judgment: Judgment normal.     Lab Results  Component Value Date   WBC 7.0 12/15/2022   HGB 14.1 12/15/2022   HCT 40.3 12/15/2022   MCV 90.6 12/15/2022   PLT 157 12/15/2022   No results found for: "FERRITIN", "IRON", "TIBC", "UIBC", "IRONPCTSAT" Lab Results  Component Value Date   RBC 4.45 12/15/2022   No results found for: "KPAFRELGTCHN", "LAMBDASER", "KAPLAMBRATIO" No results found for: "IGGSERUM", "IGA", "IGMSERUM" No results found for: "TOTALPROTELP", "ALBUMINELP", "A1GS", "A2GS", "BETS", "BETA2SER", "GAMS", "MSPIKE", "SPEI"   Chemistry      Component Value Date/Time   NA 140 11/17/2022 0800   K 3.5 11/17/2022 0800   CL 103 11/17/2022 0800   CO2 28 11/17/2022 0800   BUN 18 11/17/2022 0800   CREATININE 1.18 11/17/2022 0800      Component Value Date/Time   CALCIUM 8.9 11/17/2022 0800   ALKPHOS 69 11/17/2022 0800   AST 14 (L) 11/17/2022 0800   ALT 15 11/17/2022 0800   BILITOT 0.4 11/17/2022 0800       Impression and Plan: Kevin Hunter is a very pleasant 57 yo  caucasian gentleman with 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 He completed his chemotherapy along with immunotherapy.   He now is on maintenance therapy with nivolumab and tolerating well with a nice response.   Again, we really need to have a PET scan.  This  is the best way for [place] to identify whether or not he has progressed.  We will have to try to get the insurance company to approve this.  I think it is very sad that they do not do this.  I know this is all about money.  Will try to get the PET scan in about 3 weeks.  We will plan to get her back in 4 weeks.  Hopefully, the insurance company will approve his PET scan.  We really need to have this done to see how things are going.  If everything looks fine, then we might be able to move his appointments out a little bit longer.  Hopefully, his MRI of the brain will also turn out well.  Will plan to see him back in 4 weeks. Marland Kitchen  Josph Macho, MD 5/1/20248:06 AM

## 2022-12-17 LAB — T4: T4, Total: 6.2 ug/dL (ref 4.5–12.0)

## 2023-01-04 ENCOUNTER — Other Ambulatory Visit: Payer: Self-pay | Admitting: Hematology & Oncology

## 2023-01-12 ENCOUNTER — Inpatient Hospital Stay: Payer: 59

## 2023-01-12 ENCOUNTER — Encounter: Payer: Self-pay | Admitting: Hematology & Oncology

## 2023-01-12 ENCOUNTER — Other Ambulatory Visit: Payer: Self-pay | Admitting: Hematology & Oncology

## 2023-01-12 ENCOUNTER — Inpatient Hospital Stay (HOSPITAL_BASED_OUTPATIENT_CLINIC_OR_DEPARTMENT_OTHER): Payer: 59 | Admitting: Hematology & Oncology

## 2023-01-12 VITALS — BP 127/80 | HR 61 | Temp 97.7°F | Resp 20 | Ht 70.0 in | Wt 287.1 lb

## 2023-01-12 VITALS — BP 136/90 | HR 62 | Resp 18

## 2023-01-12 DIAGNOSIS — I10 Essential (primary) hypertension: Secondary | ICD-10-CM | POA: Diagnosis not present

## 2023-01-12 DIAGNOSIS — Z5112 Encounter for antineoplastic immunotherapy: Secondary | ICD-10-CM | POA: Diagnosis not present

## 2023-01-12 DIAGNOSIS — C3491 Malignant neoplasm of unspecified part of right bronchus or lung: Secondary | ICD-10-CM

## 2023-01-12 LAB — CBC WITH DIFFERENTIAL (CANCER CENTER ONLY)
Abs Immature Granulocytes: 0.03 10*3/uL (ref 0.00–0.07)
Basophils Absolute: 0.1 10*3/uL (ref 0.0–0.1)
Basophils Relative: 1 %
Eosinophils Absolute: 0.1 10*3/uL (ref 0.0–0.5)
Eosinophils Relative: 2 %
HCT: 40 % (ref 39.0–52.0)
Hemoglobin: 13.9 g/dL (ref 13.0–17.0)
Immature Granulocytes: 1 %
Lymphocytes Relative: 22 %
Lymphs Abs: 1.4 10*3/uL (ref 0.7–4.0)
MCH: 32 pg (ref 26.0–34.0)
MCHC: 34.8 g/dL (ref 30.0–36.0)
MCV: 92 fL (ref 80.0–100.0)
Monocytes Absolute: 0.4 10*3/uL (ref 0.1–1.0)
Monocytes Relative: 6 %
Neutro Abs: 4.2 10*3/uL (ref 1.7–7.7)
Neutrophils Relative %: 68 %
Platelet Count: 154 10*3/uL (ref 150–400)
RBC: 4.35 MIL/uL (ref 4.22–5.81)
RDW: 14.3 % (ref 11.5–15.5)
WBC Count: 6.2 10*3/uL (ref 4.0–10.5)
nRBC: 0 % (ref 0.0–0.2)

## 2023-01-12 LAB — CMP (CANCER CENTER ONLY)
ALT: 15 U/L (ref 0–44)
AST: 13 U/L — ABNORMAL LOW (ref 15–41)
Albumin: 4.4 g/dL (ref 3.5–5.0)
Alkaline Phosphatase: 72 U/L (ref 38–126)
Anion gap: 8 (ref 5–15)
BUN: 12 mg/dL (ref 6–20)
CO2: 29 mmol/L (ref 22–32)
Calcium: 9.3 mg/dL (ref 8.9–10.3)
Chloride: 103 mmol/L (ref 98–111)
Creatinine: 1.19 mg/dL (ref 0.61–1.24)
GFR, Estimated: >60 mL/min (ref >60)
Glucose, Bld: 126 mg/dL — ABNORMAL HIGH (ref 70–99)
Potassium: 3.5 mmol/L (ref 3.5–5.1)
Sodium: 140 mmol/L (ref 135–145)
Total Bilirubin: 0.4 mg/dL (ref 0.3–1.2)
Total Protein: 6.6 g/dL (ref 6.5–8.1)

## 2023-01-12 LAB — TSH: TSH: 2.002 u[IU]/mL (ref 0.350–4.500)

## 2023-01-12 LAB — LACTATE DEHYDROGENASE: LDH: 167 U/L (ref 98–192)

## 2023-01-12 MED ORDER — HEPARIN SOD (PORK) LOCK FLUSH 100 UNIT/ML IV SOLN
500.0000 [IU] | Freq: Once | INTRAVENOUS | Status: AC | PRN
  Administered 2023-01-12: 500 [IU]

## 2023-01-12 MED ORDER — SODIUM CHLORIDE 0.9 % IV SOLN: Freq: Once | INTRAVENOUS | Status: AC

## 2023-01-12 MED ORDER — SODIUM CHLORIDE 0.9 % IV SOLN
480.0000 mg | Freq: Once | INTRAVENOUS | Status: AC
  Administered 2023-01-12: 480 mg via INTRAVENOUS
  Filled 2023-01-12: qty 48

## 2023-01-12 MED ORDER — SODIUM CHLORIDE 0.9% FLUSH
10.0000 mL | INTRAVENOUS | Status: DC | PRN
  Administered 2023-01-12: 10 mL

## 2023-01-12 NOTE — Patient Instructions (Signed)
[facility]  Discharge Instructions: Thank you for choosing [facility] to provide your oncology and hematology care.   If you have a lab appointment with the Cancer Center, please go directly to the Cancer Center and check in at the registration area.  Wear comfortable clothing and clothing appropriate for easy access to any Portacath or PICC line.   We strive to give you quality time with your provider. You may need to reschedule your appointment if you arrive late (15 or more minutes).  Arriving late affects you and other patients whose appointments are after yours.  Also, if you miss three or more appointments without notifying the office, you may be dismissed from the clinic at the provider's discretion.      For prescription refill requests, have your pharmacy contact our office and allow 72 hours for refills to be completed.    Today you received the following chemotherapy and/or immunotherapy agents Opdivo.      To help prevent nausea and vomiting after your treatment, we encourage you to take your nausea medication as directed.  BELOW ARE SYMPTOMS THAT SHOULD BE REPORTED IMMEDIATELY: *FEVER GREATER THAN 100.4 F (38 C) OR HIGHER *CHILLS OR SWEATING *NAUSEA AND VOMITING THAT IS NOT CONTROLLED WITH YOUR NAUSEA MEDICATION *UNUSUAL SHORTNESS OF BREATH *UNUSUAL BRUISING OR BLEEDING *URINARY PROBLEMS (pain or burning when urinating, or frequent urination) *BOWEL PROBLEMS (unusual diarrhea, constipation, pain near the anus) TENDERNESS IN MOUTH AND THROAT WITH OR WITHOUT PRESENCE OF ULCERS (sore throat, sores in mouth, or a toothache) UNUSUAL RASH, SWELLING OR PAIN  UNUSUAL VAGINAL DISCHARGE OR ITCHING   Items with * indicate a potential emergency and should be followed up as soon as possible or go to the Emergency Department if any problems should occur.  Please show the CHEMOTHERAPY ALERT CARD or IMMUNOTHERAPY ALERT CARD at check-in  to the Emergency Department and triage nurse. Should you have questions after your visit or need to cancel or reschedule your appointment, please contact [facility]  [phone] and follow the prompts.  Office hours are 8:00 a.m. to 4:30 p.m. Monday - Friday. Please note that voicemails left after 4:00 p.m. may not be returned until the following business day.  We are closed weekends and major holidays. You have access to a nurse at all times for urgent questions. Please call the main number to the clinic [phone] and follow the prompts.  For any non-urgent questions, you may also contact your provider using MyChart. We now offer e-Visits for anyone 18 and older to request care online for non-urgent symptoms. For details visit [email or web address].   Also download the MyChart app! Go to the app store, search "MyChart", open the app, select [facility], and log in with your MyChart username and password.   

## 2023-01-12 NOTE — Progress Notes (Signed)
Hematology and Oncology Follow Up Visit  Kevin Hunter [id number] [birth year] 57 y.o. 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  He had a wonderful Memorial Day weekend.  He and his family were down in [place] had a family get together.  They had a wonderful time.  He has not yet had his PET scan.  I will have to see why this is not been done.  He has had no problems with headache.  He still working.  He works at [employer].  He has had no cough or shortness of breath.  He has had no diarrhea.  There is been no nausea or vomiting.  He has had no change in bowel or bladder habits.  He has had no rashes.  Is been no leg swelling.  His thyroid is doing okay.  His TSH was 1.9.   Currently, I would have said that his performance status is probably ECOG 0.    Medications:  Allergies as of 01/12/2023       Reactions   Paclitaxel Anaphylaxis   Penicillins Other (See Comments)   Unknown Reaction when he was an infant.        Medication List        Accurate as of Jan 12, 2023  8:49 AM. If you have any questions, ask your nurse or doctor.          amLODipine 5 MG tablet Commonly known as: NORVASC Take 5 mg by mouth at bedtime.   clonazePAM 1 MG tablet Commonly known as: KLONOPIN Take 1 mg by mouth 2 (two) times daily as needed for anxiety (May take additional 0.5mg  as needed for panic attack).   cyclobenzaprine 10 MG tablet Commonly known as: FLEXERIL Take 1 tablet (10 mg total)  by mouth 3 (three) times daily as needed for muscle spasms.   pantoprazole 40 MG tablet Commonly known as: PROTONIX TAKE 1 TABLET BY MOUTH TWICE A DAY   pseudoephedrine 30 MG tablet Commonly known as: SUDAFED Take 60 mg by mouth every 4 (four) hours as needed (headache).   psyllium 58.6 % packet Commonly known as: METAMUCIL Take 1 packet by mouth daily.   QUEtiapine 400 MG 24 hr tablet Commonly known as: SEROQUEL XR Take 400 mg by mouth at bedtime.   sildenafil 20 MG tablet Commonly known as: REVATIO 2-5 tablets Orally daily as needed for 90 days   solifenacin 10 MG tablet Commonly known as: VESICARE Take 10 mg by mouth daily.   traZODone 100 MG tablet Commonly known as: DESYREL Take 300 mg by mouth at bedtime.   venlafaxine 75 MG tablet Commonly known as: EFFEXOR Take 150 mg by mouth at bedtime.        Allergies:  Allergies  Allergen Reactions   Paclitaxel Anaphylaxis   Penicillins Other (See Comments)    Unknown Reaction when he was an infant.    Past Medical History, Surgical history, Social history, and Family History were reviewed and  updated.  Review of Systems: Review of Systems  Constitutional: Negative.   HENT: Negative.    Eyes: Negative.   Respiratory: Negative.    Cardiovascular: Negative.   Gastrointestinal: Negative.   Genitourinary: Negative.   Musculoskeletal: Negative.   Skin: Negative.   Neurological:  Positive for tingling.  Endo/Heme/Allergies: Negative.   Psychiatric/Behavioral: Negative.        Physical Exam: Vital signs are temperature of 98.  Pulse 61.  Blood pressure 137/87.  Weight is 289 pounds  Wt Readings from Last 3 Encounters:  01/12/23 287 lb 1.9 oz (130.2 kg)  12/15/22 289 lb 1.9 oz (131.1 kg)  11/23/22 287 lb 4.8 oz (130.3 kg)    Physical Exam Vitals reviewed.  HENT:     Head: Normocephalic and atraumatic.  Eyes:     Pupils: Pupils are equal, round, and reactive to light.  Cardiovascular:     Rate and  Rhythm: Normal rate and regular rhythm.     Heart sounds: Normal heart sounds.  Pulmonary:     Effort: Pulmonary effort is normal.     Breath sounds: Normal breath sounds.  Abdominal:     General: Bowel sounds are normal.     Palpations: Abdomen is soft.  Musculoskeletal:        General: No tenderness or deformity. Normal range of motion.     Cervical back: Normal range of motion.  Lymphadenopathy:     Cervical: No cervical adenopathy.  Skin:    General: Skin is warm and dry.     Findings: No erythema or rash.  Neurological:     Mental Status: He is alert and oriented to person, place, and time.  Psychiatric:        Behavior: Behavior normal.        Thought Content: Thought content normal.        Judgment: Judgment normal.     Lab Results  Component Value Date   WBC 6.2 01/12/2023   HGB 13.9 01/12/2023   HCT 40.0 01/12/2023   MCV 92.0 01/12/2023   PLT 154 01/12/2023   No results found for: "FERRITIN", "IRON", "TIBC", "UIBC", "IRONPCTSAT" Lab Results  Component Value Date   RBC 4.35 01/12/2023   No results found for: "KPAFRELGTCHN", "LAMBDASER", "KAPLAMBRATIO" No results found for: "IGGSERUM", "IGA", "IGMSERUM" No results found for: "TOTALPROTELP", "ALBUMINELP", "A1GS", "A2GS", "BETS", "BETA2SER", "GAMS", "MSPIKE", "SPEI"   Chemistry      Component Value Date/Time   NA 140 01/12/2023 0741   K 3.5 01/12/2023 0741   CL 103 01/12/2023 0741   CO2 29 01/12/2023 0741   BUN 12 01/12/2023 0741   CREATININE 1.19 01/12/2023 0741      Component Value Date/Time   CALCIUM 9.3 01/12/2023 0741   ALKPHOS 72 01/12/2023 0741   AST 13 (L) 01/12/2023 0741   ALT 15 01/12/2023 0741   BILITOT 0.4 01/12/2023 0741       Impression and Plan: Kevin Hunter is a very pleasant 57 yo caucasian gentleman with 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 He completed his chemotherapy along with immunotherapy.   He now is on maintenance therapy with nivolumab and  tolerating well with a nice response.   Again, we really need to have a PET scan.  This is the best way for [place] to identify whether or not he has progressed.  We will have to try to get the insurance company to approve this.  I think it is very sad that they do  not do this.  I know this is all about money.  We will plan to get him back in 4 weeks.   Josph Macho, MD 5/29/20248:49 AM

## 2023-01-12 NOTE — Patient Instructions (Signed)

## 2023-01-13 ENCOUNTER — Encounter: Payer: Self-pay | Admitting: *Deleted

## 2023-01-13 ENCOUNTER — Other Ambulatory Visit: Payer: Self-pay | Admitting: *Deleted

## 2023-01-13 MED ORDER — PANTOPRAZOLE SODIUM 40 MG PO TBEC: 40.0000 mg | DELAYED_RELEASE_TABLET | Freq: Two times a day (BID) | ORAL | 3 refills | Status: DC

## 2023-01-13 NOTE — Progress Notes (Signed)
Approval received for Protonix 40 mg BID by plan through 01/13/24.

## 2023-01-17 ENCOUNTER — Encounter: Payer: Self-pay | Admitting: *Deleted

## 2023-01-17 NOTE — Progress Notes (Signed)
Received approval for Pantoprazole 40 mg effective 01/13/23-01/13/24. PA number [id number].

## 2023-01-27 ENCOUNTER — Encounter (HOSPITAL_COMMUNITY): Payer: 59

## 2023-02-09 ENCOUNTER — Inpatient Hospital Stay (HOSPITAL_BASED_OUTPATIENT_CLINIC_OR_DEPARTMENT_OTHER): Payer: 59 | Admitting: Hematology & Oncology

## 2023-02-09 ENCOUNTER — Inpatient Hospital Stay: Payer: 59

## 2023-02-09 ENCOUNTER — Encounter: Payer: Self-pay | Admitting: Hematology & Oncology

## 2023-02-09 ENCOUNTER — Inpatient Hospital Stay: Payer: 59 | Attending: Internal Medicine

## 2023-02-09 VITALS — Ht 70.0 in | Wt 286.0 lb

## 2023-02-09 DIAGNOSIS — C7970 Secondary malignant neoplasm of unspecified adrenal gland: Secondary | ICD-10-CM | POA: Insufficient documentation

## 2023-02-09 DIAGNOSIS — C3411 Malignant neoplasm of upper lobe, right bronchus or lung: Secondary | ICD-10-CM | POA: Diagnosis present

## 2023-02-09 DIAGNOSIS — Z79620 Long term (current) use of immunosuppressive biologic: Secondary | ICD-10-CM | POA: Insufficient documentation

## 2023-02-09 DIAGNOSIS — C3491 Malignant neoplasm of unspecified part of right bronchus or lung: Secondary | ICD-10-CM

## 2023-02-09 DIAGNOSIS — C7931 Secondary malignant neoplasm of brain: Secondary | ICD-10-CM | POA: Insufficient documentation

## 2023-02-09 DIAGNOSIS — Z5112 Encounter for antineoplastic immunotherapy: Secondary | ICD-10-CM | POA: Diagnosis present

## 2023-02-09 LAB — CBC WITH DIFFERENTIAL (CANCER CENTER ONLY)
Abs Immature Granulocytes: 0.09 10*3/uL — ABNORMAL HIGH (ref 0.00–0.07)
Basophils Absolute: 0.1 10*3/uL (ref 0.0–0.1)
Basophils Relative: 1 %
Eosinophils Absolute: 0.1 10*3/uL (ref 0.0–0.5)
Eosinophils Relative: 2 %
HCT: 40.3 % (ref 39.0–52.0)
Hemoglobin: 13.9 g/dL (ref 13.0–17.0)
Immature Granulocytes: 1 %
Lymphocytes Relative: 18 %
Lymphs Abs: 1.3 10*3/uL (ref 0.7–4.0)
MCH: 32 pg (ref 26.0–34.0)
MCHC: 34.5 g/dL (ref 30.0–36.0)
MCV: 92.9 fL (ref 80.0–100.0)
Monocytes Absolute: 0.7 10*3/uL (ref 0.1–1.0)
Monocytes Relative: 10 %
Neutro Abs: 4.7 10*3/uL (ref 1.7–7.7)
Neutrophils Relative %: 68 %
Platelet Count: 158 10*3/uL (ref 150–400)
RBC: 4.34 MIL/uL (ref 4.22–5.81)
RDW: 14.4 % (ref 11.5–15.5)
WBC Count: 6.9 10*3/uL (ref 4.0–10.5)
nRBC: 0 % (ref 0.0–0.2)

## 2023-02-09 LAB — CMP (CANCER CENTER ONLY)
ALT: 16 U/L (ref 0–44)
AST: 12 U/L — ABNORMAL LOW (ref 15–41)
Albumin: 4.3 g/dL (ref 3.5–5.0)
Alkaline Phosphatase: 66 U/L (ref 38–126)
Anion gap: 5 (ref 5–15)
BUN: 15 mg/dL (ref 6–20)
CO2: 31 mmol/L (ref 22–32)
Calcium: 9.2 mg/dL (ref 8.9–10.3)
Chloride: 103 mmol/L (ref 98–111)
Creatinine: 1.24 mg/dL (ref 0.61–1.24)
GFR, Estimated: >60 mL/min (ref >60)
Glucose, Bld: 98 mg/dL (ref 70–99)
Potassium: 3.5 mmol/L (ref 3.5–5.1)
Sodium: 139 mmol/L (ref 135–145)
Total Bilirubin: 0.4 mg/dL (ref 0.3–1.2)
Total Protein: 6.6 g/dL (ref 6.5–8.1)

## 2023-02-09 LAB — TSH: TSH: 1.811 u[IU]/mL (ref 0.350–4.500)

## 2023-02-09 MED ORDER — SODIUM CHLORIDE 0.9% FLUSH
10.0000 mL | INTRAVENOUS | Status: DC | PRN
  Administered 2023-02-09: 10 mL

## 2023-02-09 MED ORDER — SODIUM CHLORIDE 0.9 % IV SOLN: Freq: Once | INTRAVENOUS | Status: AC

## 2023-02-09 MED ORDER — SODIUM CHLORIDE 0.9 % IV SOLN
480.0000 mg | Freq: Once | INTRAVENOUS | Status: AC
  Administered 2023-02-09: 480 mg via INTRAVENOUS
  Filled 2023-02-09: qty 48

## 2023-02-09 MED ORDER — HEPARIN SOD (PORK) LOCK FLUSH 100 UNIT/ML IV SOLN
500.0000 [IU] | Freq: Once | INTRAVENOUS | Status: AC | PRN
  Administered 2023-02-09: 500 [IU]

## 2023-02-09 MED ORDER — SODIUM CHLORIDE 0.9 % IV SOLN: Freq: Once | INTRAVENOUS | Status: DC

## 2023-02-09 NOTE — Patient Instructions (Signed)

## 2023-02-09 NOTE — Patient Instructions (Signed)
[facility]  Discharge Instructions: Thank you for choosing [facility] to provide your oncology and hematology care.   If you have a lab appointment with the Cancer Center, please go directly to the Cancer Center and check in at the registration area.  Wear comfortable clothing and clothing appropriate for easy access to any Portacath or PICC line.   We strive to give you quality time with your provider. You may need to reschedule your appointment if you arrive late (15 or more minutes).  Arriving late affects you and other patients whose appointments are after yours.  Also, if you miss three or more appointments without notifying the office, you may be dismissed from the clinic at the provider's discretion.      For prescription refill requests, have your pharmacy contact our office and allow 72 hours for refills to be completed.    Today you received the following chemotherapy and/or immunotherapy agents opdivo       To help prevent nausea and vomiting after your treatment, we encourage you to take your nausea medication as directed.  BELOW ARE SYMPTOMS THAT SHOULD BE REPORTED IMMEDIATELY: *FEVER GREATER THAN 100.4 F (38 C) OR HIGHER *CHILLS OR SWEATING *NAUSEA AND VOMITING THAT IS NOT CONTROLLED WITH YOUR NAUSEA MEDICATION *UNUSUAL SHORTNESS OF BREATH *UNUSUAL BRUISING OR BLEEDING *URINARY PROBLEMS (pain or burning when urinating, or frequent urination) *BOWEL PROBLEMS (unusual diarrhea, constipation, pain near the anus) TENDERNESS IN MOUTH AND THROAT WITH OR WITHOUT PRESENCE OF ULCERS (sore throat, sores in mouth, or a toothache) UNUSUAL RASH, SWELLING OR PAIN  UNUSUAL VAGINAL DISCHARGE OR ITCHING   Items with * indicate a potential emergency and should be followed up as soon as possible or go to the Emergency Department if any problems should occur.  Please show the CHEMOTHERAPY ALERT CARD or IMMUNOTHERAPY ALERT CARD at check-in  to the Emergency Department and triage nurse. Should you have questions after your visit or need to cancel or reschedule your appointment, please contact [facility]  [phone] and follow the prompts.  Office hours are 8:00 a.m. to 4:30 p.m. Monday - Friday. Please note that voicemails left after 4:00 p.m. may not be returned until the following business day.  We are closed weekends and major holidays. You have access to a nurse at all times for urgent questions. Please call the main number to the clinic [phone] and follow the prompts.  For any non-urgent questions, you may also contact your provider using MyChart. We now offer e-Visits for anyone 18 and older to request care online for non-urgent symptoms. For details visit [email or web address].   Also download the MyChart app! Go to the app store, search "MyChart", open the app, select [facility], and log in with your MyChart username and password.   

## 2023-02-09 NOTE — Progress Notes (Signed)
Hematology and Oncology Follow Up Visit  Kevin Hunter [id number] 23-Oct-1965 58 y.o. 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  Unfortunately, he has not yet had his PET scan.  For some reason, the insurance company is not approving this.  I am not sure as to why they would not approve it.  We have always used his PET scans to tell us how he is responding to treatment.  He feels okay.  He is still working.  He works in [employer].  He is quite busy there.  He has had no headache.  He has not had to see Kevin Hunter, I think until August.  He has had no mouth sores.  He has had no rashes.  Is been no diarrhea.  Has had no change in bowel or bladder habits.  He has had no leg swelling.  There is been no cough or shortness of breath.  We are checking his thyroid.  His last TSH was 2.0.  Currently, I would say his performance status is probably ECOG 0.    Medications:  Allergies as of 02/09/2023       Reactions   Paclitaxel Anaphylaxis   Penicillins Other (See Comments)   Unknown Reaction when he was an infant.        Medication List        Accurate as of February 09, 2023  9:28 AM. If you have any questions, ask your nurse or doctor.          amLODipine 5 MG tablet Commonly known as: NORVASC Take 5 mg by mouth at bedtime.   clonazePAM 1 MG tablet Commonly known as: KLONOPIN Take 1 mg by mouth 2 (two) times daily as needed for anxiety (May take additional 0.5mg  as needed for panic  attack).   cyclobenzaprine 10 MG tablet Commonly known as: FLEXERIL Take 1 tablet (10 mg total) by mouth 3 (three) times daily as needed for muscle spasms.   pantoprazole 40 MG tablet Commonly known as: Protonix Take 1 tablet (40 mg total) by mouth 2 (two) times daily before a meal.   pseudoephedrine 30 MG tablet Commonly known as: SUDAFED Take 60 mg by mouth every 4 (four) hours as needed (headache).   psyllium 58.6 % packet Commonly known as: METAMUCIL Take 1 packet by mouth daily.   QUEtiapine 400 MG 24 hr tablet Commonly known as: SEROQUEL XR Take 400 mg by mouth at bedtime.   sildenafil 20 MG tablet Commonly known as: REVATIO 2-5 tablets Orally daily as needed for 90 days   solifenacin 10 MG tablet Commonly known as: VESICARE Take 10 mg by mouth daily.   traZODone 100 MG tablet Commonly known as: DESYREL Take 300 mg by mouth at bedtime.   venlafaxine 75 MG tablet Commonly known as: EFFEXOR Take 150 mg by mouth at bedtime.        Allergies:  Allergies  Allergen Reactions   Paclitaxel Anaphylaxis   Penicillins Other (See Comments)  Unknown Reaction when he was an infant.    Past Medical History, Surgical history, Social history, and Family History were reviewed and updated.  Review of Systems: Review of Systems  Constitutional: Negative.   HENT: Negative.    Eyes: Negative.   Respiratory: Negative.    Cardiovascular: Negative.   Gastrointestinal: Negative.   Genitourinary: Negative.   Musculoskeletal: Negative.   Skin: Negative.   Neurological:  Positive for tingling.  Endo/Heme/Allergies: Negative.   Psychiatric/Behavioral: Negative.        Physical Exam: Vital signs are temperature of 97.5.  Pulse 65.  Blood pressure 123/75.  Weight is 286 pounds.   Wt Readings from Last 3 Encounters:  02/09/23 286 lb (129.7 kg)  01/12/23 287 lb 1.9 oz (130.2 kg)  12/15/22 289 lb 1.9 oz (131.1 kg)    Physical Exam Vitals reviewed.  HENT:      Head: Normocephalic and atraumatic.  Eyes:     Pupils: Pupils are equal, round, and reactive to light.  Cardiovascular:     Rate and Rhythm: Normal rate and regular rhythm.     Heart sounds: Normal heart sounds.  Pulmonary:     Effort: Pulmonary effort is normal.     Breath sounds: Normal breath sounds.  Abdominal:     General: Bowel sounds are normal.     Palpations: Abdomen is soft.  Musculoskeletal:        General: No tenderness or deformity. Normal range of motion.     Cervical back: Normal range of motion.  Lymphadenopathy:     Cervical: No cervical adenopathy.  Skin:    General: Skin is warm and dry.     Findings: No erythema or rash.  Neurological:     Mental Status: He is alert and oriented to person, place, and time.  Psychiatric:        Behavior: Behavior normal.        Thought Content: Thought content normal.        Judgment: Judgment normal.     Lab Results  Component Value Date   WBC 6.9 02/09/2023   HGB 13.9 02/09/2023   HCT 40.3 02/09/2023   MCV 92.9 02/09/2023   PLT 158 02/09/2023   No results found for: "FERRITIN", "IRON", "TIBC", "UIBC", "IRONPCTSAT" Lab Results  Component Value Date   RBC 4.34 02/09/2023   No results found for: "KPAFRELGTCHN", "LAMBDASER", "KAPLAMBRATIO" No results found for: "IGGSERUM", "IGA", "IGMSERUM" No results found for: "TOTALPROTELP", "ALBUMINELP", "A1GS", "A2GS", "BETS", "BETA2SER", "GAMS", "MSPIKE", "SPEI"   Chemistry      Component Value Date/Time   NA 139 02/09/2023 0810   K 3.5 02/09/2023 0810   CL 103 02/09/2023 0810   CO2 31 02/09/2023 0810   BUN 15 02/09/2023 0810   CREATININE 1.24 02/09/2023 0810      Component Value Date/Time   CALCIUM 9.2 02/09/2023 0810   ALKPHOS 66 02/09/2023 0810   AST 12 (L) 02/09/2023 0810   ALT 16 02/09/2023 0810   BILITOT 0.4 02/09/2023 0810       Impression and Plan: Kevin Hunter is a very pleasant 57 yo caucasian gentleman with stage IV metastatic squamous cell carcinoma  of the right lung. He completed his chemotherapy along with immunotherapy.   He now is on maintenance therapy with nivolumab and tolerating well with a nice response.   Again, we really need to have a PET scan.  This is the best way for [place] to identify whether or not he has progressed.  We  will have to try to get the insurance company to approve this.  I think it is very sad that they have not done this for [place].  If everything looks fine on the PET scan, maybe we can move his treatments for nivolumab out to 5 or 6 weeks.   Josph Macho, MD 6/26/20249:28 AM

## 2023-02-10 LAB — T4: T4, Total: 4.8 ug/dL (ref 4.5–12.0)

## 2023-03-01 ENCOUNTER — Other Ambulatory Visit: Payer: Self-pay

## 2023-03-09 ENCOUNTER — Encounter: Payer: Self-pay | Admitting: Hematology & Oncology

## 2023-03-09 ENCOUNTER — Inpatient Hospital Stay: Payer: 59

## 2023-03-09 ENCOUNTER — Inpatient Hospital Stay: Payer: 59 | Admitting: Hematology & Oncology

## 2023-03-09 ENCOUNTER — Inpatient Hospital Stay: Payer: 59 | Attending: Internal Medicine

## 2023-03-09 VITALS — BP 123/74 | HR 60

## 2023-03-09 DIAGNOSIS — C7970 Secondary malignant neoplasm of unspecified adrenal gland: Secondary | ICD-10-CM | POA: Diagnosis not present

## 2023-03-09 DIAGNOSIS — C3491 Malignant neoplasm of unspecified part of right bronchus or lung: Secondary | ICD-10-CM

## 2023-03-09 DIAGNOSIS — C7931 Secondary malignant neoplasm of brain: Secondary | ICD-10-CM | POA: Insufficient documentation

## 2023-03-09 DIAGNOSIS — Z5112 Encounter for antineoplastic immunotherapy: Secondary | ICD-10-CM | POA: Diagnosis present

## 2023-03-09 DIAGNOSIS — C3411 Malignant neoplasm of upper lobe, right bronchus or lung: Secondary | ICD-10-CM | POA: Insufficient documentation

## 2023-03-09 DIAGNOSIS — Z79620 Long term (current) use of immunosuppressive biologic: Secondary | ICD-10-CM | POA: Insufficient documentation

## 2023-03-09 LAB — CMP (CANCER CENTER ONLY)
ALT: 16 U/L (ref 0–44)
AST: 14 U/L — ABNORMAL LOW (ref 15–41)
Albumin: 4.2 g/dL (ref 3.5–5.0)
Alkaline Phosphatase: 65 U/L (ref 38–126)
Anion gap: 8 (ref 5–15)
BUN: 13 mg/dL (ref 6–20)
CO2: 29 mmol/L (ref 22–32)
Calcium: 9 mg/dL (ref 8.9–10.3)
Chloride: 104 mmol/L (ref 98–111)
Creatinine: 1.12 mg/dL (ref 0.61–1.24)
GFR, Estimated: >60 mL/min (ref >60)
Glucose, Bld: 122 mg/dL — ABNORMAL HIGH (ref 70–99)
Potassium: 3.3 mmol/L — ABNORMAL LOW (ref 3.5–5.1)
Sodium: 141 mmol/L (ref 135–145)
Total Bilirubin: 0.4 mg/dL (ref 0.3–1.2)
Total Protein: 6.8 g/dL (ref 6.5–8.1)

## 2023-03-09 LAB — CBC WITH DIFFERENTIAL (CANCER CENTER ONLY)
Abs Immature Granulocytes: 0.05 10*3/uL (ref 0.00–0.07)
Basophils Absolute: 0 10*3/uL (ref 0.0–0.1)
Basophils Relative: 1 %
Eosinophils Absolute: 0.1 10*3/uL (ref 0.0–0.5)
Eosinophils Relative: 2 %
HCT: 38.6 % — ABNORMAL LOW (ref 39.0–52.0)
Hemoglobin: 13.5 g/dL (ref 13.0–17.0)
Immature Granulocytes: 1 %
Lymphocytes Relative: 18 %
Lymphs Abs: 1.2 10*3/uL (ref 0.7–4.0)
MCH: 32.8 pg (ref 26.0–34.0)
MCHC: 35 g/dL (ref 30.0–36.0)
MCV: 93.7 fL (ref 80.0–100.0)
Monocytes Absolute: 0.6 10*3/uL (ref 0.1–1.0)
Monocytes Relative: 9 %
Neutro Abs: 4.6 10*3/uL (ref 1.7–7.7)
Neutrophils Relative %: 69 %
Platelet Count: 146 10*3/uL — ABNORMAL LOW (ref 150–400)
RBC: 4.12 MIL/uL — ABNORMAL LOW (ref 4.22–5.81)
RDW: 14.2 % (ref 11.5–15.5)
WBC Count: 6.7 10*3/uL (ref 4.0–10.5)
nRBC: 0 % (ref 0.0–0.2)

## 2023-03-09 LAB — TSH: TSH: 2.532 u[IU]/mL (ref 0.350–4.500)

## 2023-03-09 MED ORDER — SODIUM CHLORIDE 0.9% FLUSH
10.0000 mL | INTRAVENOUS | Status: DC | PRN
  Administered 2023-03-09: 10 mL

## 2023-03-09 MED ORDER — SODIUM CHLORIDE 0.9 % IV SOLN: Freq: Once | INTRAVENOUS | Status: AC

## 2023-03-09 MED ORDER — HEPARIN SOD (PORK) LOCK FLUSH 100 UNIT/ML IV SOLN
500.0000 [IU] | Freq: Once | INTRAVENOUS | Status: AC | PRN
  Administered 2023-03-09: 500 [IU]

## 2023-03-09 MED ORDER — SODIUM CHLORIDE 0.9 % IV SOLN
480.0000 mg | Freq: Once | INTRAVENOUS | Status: AC
  Administered 2023-03-09: 480 mg via INTRAVENOUS
  Filled 2023-03-09: qty 48

## 2023-03-09 NOTE — Progress Notes (Signed)
Hematology and Oncology Follow Up Visit  Kevin Hunter [id number] [birth year] 57 y.o. 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  Thankfully, his PET scan will be done next Monday.  He has been feeling great.  He has had no complaints at all.  He has had no headache.  He has had no problems with swallowing.  Is been no issues with weakness.  He is still working full-time.  He works at [employer].  He has had a good appetite.  He has had no nausea or vomiting.  He has had no change in bowel or bladder habits.  His last TSH was 1.8 back in June.  He has had no bleeding.  There is been no leg swelling.  He has had no rashes.  Overall, I would say that his performance status is probably ECOG 0.  Medications:  Allergies as of 03/09/2023       Reactions   Paclitaxel Anaphylaxis   Penicillins Other (See Comments)   Unknown Reaction when he was an infant.        Medication List        Accurate as of March 09, 2023  8:02 AM. If you have any questions, ask your nurse or doctor.          amLODipine 5 MG tablet Commonly known as: NORVASC Take 5 mg by mouth at bedtime.   clonazePAM 1 MG tablet Commonly known as: KLONOPIN Take 1 mg by mouth 2 (two) times daily as needed for anxiety (May take additional 0.5mg  as needed for panic attack).   cyclobenzaprine 10 MG tablet Commonly known as: FLEXERIL Take 1 tablet (10 mg total) by mouth 3 (three) times daily as needed for muscle spasms.    pantoprazole 40 MG tablet Commonly known as: Protonix Take 1 tablet (40 mg total) by mouth 2 (two) times daily before a meal.   pseudoephedrine 30 MG tablet Commonly known as: SUDAFED Take 60 mg by mouth every 4 (four) hours as needed (headache).   psyllium 58.6 % packet Commonly known as: METAMUCIL Take 1 packet by mouth daily.   QUEtiapine 400 MG 24 hr tablet Commonly known as: SEROQUEL XR Take 400 mg by mouth at bedtime.   sildenafil 20 MG tablet Commonly known as: REVATIO 2-5 tablets Orally daily as needed for 90 days   solifenacin 10 MG tablet Commonly known as: VESICARE Take 10 mg by mouth daily.   traZODone 100 MG tablet Commonly known as: DESYREL Take 300 mg by mouth at bedtime.   venlafaxine 75 MG tablet Commonly known as: EFFEXOR Take 150 mg by mouth at bedtime.        Allergies:  Allergies  Allergen Reactions   Paclitaxel Anaphylaxis   Penicillins Other (See Comments)    Unknown Reaction when he was an infant.    Past Medical History, Surgical history, Social history, and Family History were reviewed and updated.  Review of Systems: Review of Systems  Constitutional: Negative.  HENT: Negative.    Eyes: Negative.   Respiratory: Negative.    Cardiovascular: Negative.   Gastrointestinal: Negative.   Genitourinary: Negative.   Musculoskeletal: Negative.   Skin: Negative.   Neurological:  Positive for tingling.  Endo/Heme/Allergies: Negative.   Psychiatric/Behavioral: Negative.        Physical Exam: Vital signs are temperature of 98.1.  Pulse 73.  Blood pressure 118/81.  Weight is 290 pounds.     Wt Readings from Last 3 Encounters:  03/09/23 290 lb (131.5 kg)  02/09/23 286 lb (129.7 kg)  01/12/23 287 lb 1.9 oz (130.2 kg)    Physical Exam Vitals reviewed.  HENT:     Head: Normocephalic and atraumatic.  Eyes:     Pupils: Pupils are equal, round, and reactive to light.  Cardiovascular:     Rate and Rhythm: Normal rate and regular  rhythm.     Heart sounds: Normal heart sounds.  Pulmonary:     Effort: Pulmonary effort is normal.     Breath sounds: Normal breath sounds.  Abdominal:     General: Bowel sounds are normal.     Palpations: Abdomen is soft.  Musculoskeletal:        General: No tenderness or deformity. Normal range of motion.     Cervical back: Normal range of motion.  Lymphadenopathy:     Cervical: No cervical adenopathy.  Skin:    General: Skin is warm and dry.     Findings: No erythema or rash.  Neurological:     Mental Status: He is alert and oriented to person, place, and time.  Psychiatric:        Behavior: Behavior normal.        Thought Content: Thought content normal.        Judgment: Judgment normal.     Lab Results  Component Value Date   WBC 6.9 02/09/2023   HGB 13.9 02/09/2023   HCT 40.3 02/09/2023   MCV 92.9 02/09/2023   PLT 158 02/09/2023   No results found for: "FERRITIN", "IRON", "TIBC", "UIBC", "IRONPCTSAT" Lab Results  Component Value Date   RBC 4.34 02/09/2023   No results found for: "KPAFRELGTCHN", "LAMBDASER", "KAPLAMBRATIO" No results found for: "IGGSERUM", "IGA", "IGMSERUM" No results found for: "TOTALPROTELP", "ALBUMINELP", "A1GS", "A2GS", "BETS", "BETA2SER", "GAMS", "MSPIKE", "SPEI"   Chemistry      Component Value Date/Time   NA 139 02/09/2023 0810   K 3.5 02/09/2023 0810   CL 103 02/09/2023 0810   CO2 31 02/09/2023 0810   BUN 15 02/09/2023 0810   CREATININE 1.24 02/09/2023 0810      Component Value Date/Time   CALCIUM 9.2 02/09/2023 0810   ALKPHOS 66 02/09/2023 0810   AST 12 (L) 02/09/2023 0810   ALT 16 02/09/2023 0810   BILITOT 0.4 02/09/2023 0810       Impression and Plan: Kevin Hunter is a very pleasant 57 yo caucasian gentleman with 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 He completed his chemotherapy along with immunotherapy.   He now is on maintenance therapy with nivolumab and tolerating well with a nice response.    Hopefully, the PET scan will show that he has stable disease.  By his exam, everything certainly seems to be doing quite well.  I am glad that neurologically, he is doing nicely.  He has had no issues neurologically.  We will go ahead and plan for another follow-up in 4 weeks.  If the PET scan looks okay, then we can  hopefully try to move his appointments out a little bit longer.   Josph Macho, MD 7/24/20248:02 AM

## 2023-03-09 NOTE — Patient Instructions (Signed)

## 2023-03-09 NOTE — Patient Instructions (Signed)
[facility]  Discharge Instructions: Thank you for choosing [facility] to provide your oncology and hematology care.   If you have a lab appointment with the Cancer Center, please go directly to the Cancer Center and check in at the registration area.  Wear comfortable clothing and clothing appropriate for easy access to any Portacath or PICC line.   We strive to give you quality time with your provider. You may need to reschedule your appointment if you arrive late (15 or more minutes).  Arriving late affects you and other patients whose appointments are after yours.  Also, if you miss three or more appointments without notifying the office, you may be dismissed from the clinic at the provider's discretion.      For prescription refill requests, have your pharmacy contact our office and allow 72 hours for refills to be completed.    Today you received the following chemotherapy and/or immunotherapy agents Opdivo.      To help prevent nausea and vomiting after your treatment, we encourage you to take your nausea medication as directed.  BELOW ARE SYMPTOMS THAT SHOULD BE REPORTED IMMEDIATELY: *FEVER GREATER THAN 100.4 F (38 C) OR HIGHER *CHILLS OR SWEATING *NAUSEA AND VOMITING THAT IS NOT CONTROLLED WITH YOUR NAUSEA MEDICATION *UNUSUAL SHORTNESS OF BREATH *UNUSUAL BRUISING OR BLEEDING *URINARY PROBLEMS (pain or burning when urinating, or frequent urination) *BOWEL PROBLEMS (unusual diarrhea, constipation, pain near the anus) TENDERNESS IN MOUTH AND THROAT WITH OR WITHOUT PRESENCE OF ULCERS (sore throat, sores in mouth, or a toothache) UNUSUAL RASH, SWELLING OR PAIN  UNUSUAL VAGINAL DISCHARGE OR ITCHING   Items with * indicate a potential emergency and should be followed up as soon as possible or go to the Emergency Department if any problems should occur.  Please show the CHEMOTHERAPY ALERT CARD or IMMUNOTHERAPY ALERT CARD at check-in  to the Emergency Department and triage nurse. Should you have questions after your visit or need to cancel or reschedule your appointment, please contact [facility]  [phone] and follow the prompts.  Office hours are 8:00 a.m. to 4:30 p.m. Monday - Friday. Please note that voicemails left after 4:00 p.m. may not be returned until the following business day.  We are closed weekends and major holidays. You have access to a nurse at all times for urgent questions. Please call the main number to the clinic [phone] and follow the prompts.  For any non-urgent questions, you may also contact your provider using MyChart. We now offer e-Visits for anyone 18 and older to request care online for non-urgent symptoms. For details visit [email or web address].   Also download the MyChart app! Go to the app store, search "MyChart", open the app, select [facility], and log in with your MyChart username and password.   

## 2023-03-10 LAB — T4: T4, Total: 5.6 ug/dL (ref 4.5–12.0)

## 2023-03-16 ENCOUNTER — Encounter (HOSPITAL_COMMUNITY)
Admission: RE | Admit: 2023-03-16 | Discharge: 2023-03-16 | Disposition: A | Discharge status: Home / Self Care | Payer: 59 | Source: Ambulatory Visit | Attending: Hematology & Oncology | Admitting: Hematology & Oncology

## 2023-03-16 ENCOUNTER — Telehealth: Payer: Self-pay | Admitting: Internal Medicine

## 2023-03-16 DIAGNOSIS — C3491 Malignant neoplasm of unspecified part of right bronchus or lung: Secondary | ICD-10-CM | POA: Diagnosis present

## 2023-03-16 LAB — GLUCOSE, CAPILLARY: Glucose-Capillary: 94 mg/dL (ref 70–99)

## 2023-03-16 MED ORDER — FLUDEOXYGLUCOSE F - 18 (FDG) INJECTION
13.9900 | Freq: Once | INTRAVENOUS | Status: AC | PRN
  Administered 2023-03-16: 13.99 via INTRAVENOUS

## 2023-03-16 NOTE — Telephone Encounter (Signed)
Called patient regarding upcoming August appointments, left a voicemail.

## 2023-03-18 ENCOUNTER — Encounter: Payer: Self-pay | Admitting: Hematology & Oncology

## 2023-03-18 ENCOUNTER — Encounter: Payer: Self-pay | Admitting: Internal Medicine

## 2023-03-21 ENCOUNTER — Other Ambulatory Visit: Payer: Self-pay

## 2023-03-24 ENCOUNTER — Ambulatory Visit
Admission: RE | Admit: 2023-03-24 | Discharge: 2023-03-24 | Disposition: A | Discharge status: Home / Self Care | Payer: 59 | Source: Ambulatory Visit | Attending: Internal Medicine | Admitting: Internal Medicine

## 2023-03-24 DIAGNOSIS — C7931 Secondary malignant neoplasm of brain: Secondary | ICD-10-CM

## 2023-03-24 MED ORDER — GADOPICLENOL 0.5 MMOL/ML IV SOLN
10.0000 mL | Freq: Once | INTRAVENOUS | Status: AC | PRN
  Administered 2023-03-24: 10 mL via INTRAVENOUS

## 2023-03-29 ENCOUNTER — Other Ambulatory Visit: Payer: Self-pay

## 2023-03-29 ENCOUNTER — Inpatient Hospital Stay: Payer: 59 | Attending: Internal Medicine | Admitting: Internal Medicine

## 2023-03-29 VITALS — BP 138/86 | HR 72 | Temp 98.1°F | Resp 20 | Wt 286.0 lb

## 2023-03-29 DIAGNOSIS — Z79620 Long term (current) use of immunosuppressive biologic: Secondary | ICD-10-CM | POA: Insufficient documentation

## 2023-03-29 DIAGNOSIS — C7970 Secondary malignant neoplasm of unspecified adrenal gland: Secondary | ICD-10-CM | POA: Insufficient documentation

## 2023-03-29 DIAGNOSIS — Z5112 Encounter for antineoplastic immunotherapy: Secondary | ICD-10-CM | POA: Insufficient documentation

## 2023-03-29 DIAGNOSIS — C7931 Secondary malignant neoplasm of brain: Secondary | ICD-10-CM | POA: Diagnosis not present

## 2023-03-29 DIAGNOSIS — C3490 Malignant neoplasm of unspecified part of unspecified bronchus or lung: Secondary | ICD-10-CM

## 2023-03-29 DIAGNOSIS — C3411 Malignant neoplasm of upper lobe, right bronchus or lung: Secondary | ICD-10-CM | POA: Diagnosis present

## 2023-03-29 NOTE — Progress Notes (Signed)
[facility] [address]  [place], [place] [address] [phone]   Interval Evaluation  Date of Service: 03/29/23 Patient Name: Kevin Hunter Patient MRN: [id number] Patient DOB: [birth year] Provider: Henreitta Leber, MD  Identifying Statement:  Kevin Hunter is a 57 y.o. male with Non-small cell lung cancer metastatic to brain [facility])   Primary Cancer:  Oncologic History: Oncology History  Lung cancer, primary, with metastasis from lung to other site, right (HCC)  05/27/2021 Initial Diagnosis   Lung cancer, primary, with metastasis from lung to other site, right [facility])   05/27/2021 Cancer Staging   Staging form: Lung, AJCC 8th Edition - Clinical stage from 05/27/2021: Stage IV (cT3, cN2, pM1) - Signed by Josph Macho, MD on 05/27/2021 Histologic grade (G): G2 Histologic grading system: 4 grade system   06/25/2021 - 07/14/2021 Chemotherapy   Patient is on Treatment Plan : LUNG NSCLC SQUAMOUS Nivolumab + Ipilimumab + Carboplatin + Paclitaxel q42d X 1 cycle / Nivolumab + Ipilimumab q42d     07/22/2021 - 10/16/2021 Chemotherapy   Patient is on Treatment Plan : LUNG Carboplatin / Docetaxel q21d     07/22/2021 - 03/17/2022 Chemotherapy   Patient is on Treatment Plan : LUNG Nivolumab q21d     07/22/2021 -  Chemotherapy   Patient is on Treatment Plan : LUNG Nivolumab (240) q14d/Nivolumab 480 mg q28d      CNS Oncologic History 06/12/21: SRS x3 [facility])  Interval History: Kevin Hunter presents today for follow up after recent MRI brain.  No clinical changes today.  Denies speech issues.  Has been off decadron.  Continues on opdivo q4 weeks with Dr. Myna Hidalgo.    H+P (02/08/22) Patient presents today for follow up after recent neurologic symptoms.  He describes numbness and weakness of his left arm and leg, progressive over several weeks.  He began dragging the left leg, interfering with ambulation.  Also describes issues with cognition,  short term memory.  Morning headaches and blurry vision have accompanied these changes.  Decadron was started late last week, this has led to significantly improved symptom burden.  He feels close to his baseline at this time.  Continues on opdivo with Dr. Myna Hidalgo.    Medications: Current Outpatient Medications on File Prior to Visit  Medication Sig Dispense Refill   amLODipine (NORVASC) 5 MG tablet Take 5 mg by mouth at bedtime.     clonazePAM (KLONOPIN) 1 MG tablet Take 1 mg by mouth 2 (two) times daily as needed for anxiety (May take additional 0.5mg  as needed for panic attack).     cyclobenzaprine (FLEXERIL) 10 MG tablet Take 1 tablet (10 mg total) by mouth 3 (three) times daily as needed for muscle spasms. 60 tablet 1   pantoprazole (PROTONIX) 40 MG tablet Take 1 tablet (40 mg total) by mouth 2 (two) times daily before a meal. 60 tablet 3   pseudoephedrine (SUDAFED) 30 MG tablet Take 60 mg by mouth every 4 (four) hours as needed (headache).     psyllium (METAMUCIL) 58.6 % packet Take 1 packet by mouth daily.     QUEtiapine (SEROQUEL XR) 400 MG 24 hr tablet Take 400 mg by mouth at bedtime.     sildenafil (REVATIO) 20 MG tablet 2-5 tablets Orally daily as needed for 90 days     solifenacin (VESICARE) 10 MG tablet Take 10 mg by mouth daily.     traZODone (DESYREL) 100 MG tablet Take 300 mg by  mouth at bedtime.     venlafaxine (EFFEXOR) 75 MG tablet Take 150 mg by mouth at bedtime.     [DISCONTINUED] prochlorperazine (COMPAZINE) 10 MG tablet Take 1 tablet (10 mg total) by mouth every 6 (six) hours as needed (Nausea or vomiting). 30 tablet 1   No current facility-administered medications on file prior to visit.    Allergies:  Allergies  Allergen Reactions   Paclitaxel Anaphylaxis   Penicillins Other (See Comments)    Unknown Reaction when he was an infant.   Past Medical History:  Past Medical History:  Diagnosis Date   Anxiety    Bipolar 1 disorder (HCC)    Goals of care,  counseling/discussion 05/27/2021   Hypertension    Lung cancer, primary, with metastasis from lung to other site, right (HCC) 05/27/2021   Mood disorder (HCC)    Sleep apnea    Past Surgical History:  Past Surgical History:  Procedure Laterality Date   BRONCHIAL BIOPSY  05/22/2021   Procedure: BRONCHIAL BIOPSIES;  Surgeon: Josephine Igo, DO;  Location: [facility];  Service: Pulmonary;;   BRONCHIAL BRUSHINGS  05/22/2021   Procedure: BRONCHIAL BRUSHINGS;  Surgeon: Josephine Igo, DO;  Location: [facility];  Service: Pulmonary;;   BRONCHIAL NEEDLE ASPIRATION BIOPSY  05/22/2021   Procedure: BRONCHIAL NEEDLE ASPIRATION BIOPSIES;  Surgeon: Josephine Igo, DO;  Location: [facility];  Service: Pulmonary;;   IR IMAGING GUIDED PORT INSERTION  06/24/2021   VIDEO BRONCHOSCOPY WITH ENDOBRONCHIAL NAVIGATION N/A 05/22/2021   Procedure: VIDEO BRONCHOSCOPY WITH ENDOBRONCHIAL NAVIGATION;  Surgeon: Josephine Igo, DO;  Location: [facility];  Service: Pulmonary;  Laterality: N/A;   VIDEO BRONCHOSCOPY WITH RADIAL ENDOBRONCHIAL ULTRASOUND  05/22/2021   Procedure: RADIAL ENDOBRONCHIAL ULTRASOUND;  Surgeon: Josephine Igo, DO;  Location: [facility];  Service: Pulmonary;;   Social History:  Social History   Socioeconomic History   Marital status: Single    Spouse name: Not on file   Number of children: Not on file   Years of education: Not on file   Highest education level: Not on file  Occupational History   Not on file  Tobacco Use   Smoking status: Former    Current packs/day: 0.00    Average packs/day: 1.5 packs/day for 30.0 years (45.0 ttl pk-yrs)    Types: Cigarettes, E-cigarettes    Start date: 05/16/1988    Quit date: 05/16/2018    Years since quitting: 4.8   Smokeless tobacco: Never  Vaping Use   Vaping status: Never Used  Substance and Sexual Activity   Alcohol use: Never   Drug use: Never   Sexual activity: Not on file  Other Topics Concern   Not on file  Social  History Narrative   Not on file   Social Determinants of Health   Financial Resource Strain: Low Risk  (09/22/2022)   Overall Financial Resource Strain (CARDIA)    Difficulty of Paying Living Expenses: Not hard at all  Food Insecurity: No Food Insecurity (09/22/2022)   Hunger Vital Sign    Worried About Running Out of Food in the Last Year: Never true    Ran Out of Food in the Last Year: Never true  Transportation Needs: No Transportation Needs (09/22/2022)   PRAPARE - Administrator, Civil Service (Medical): No    Lack of Transportation (Non-Medical): No  Physical Activity: Not on file  Stress: Not on file  Social Connections: Not on file  Intimate Partner Violence: Not At Risk (05/28/2021)  Humiliation, Afraid, Rape, and Kick questionnaire    Fear of Current or Ex-Partner: No    Emotionally Abused: No    Physically Abused: No    Sexually Abused: No   Family History:  Family History  Problem Relation Age of Onset   Breast cancer Mother    Lung cancer Father     Review of Systems: Constitutional: Doesn't report fevers, chills or abnormal weight loss Eyes: Doesn't report blurriness of vision Ears, nose, mouth, throat, and face: Doesn't report sore throat Respiratory: Doesn't report cough, dyspnea or wheezes Cardiovascular: Doesn't report palpitation, chest discomfort  Gastrointestinal:  Doesn't report nausea, constipation, diarrhea GU: Doesn't report incontinence Skin: Doesn't report skin rashes Neurological: Per HPI Musculoskeletal: Doesn't report joint pain Behavioral/Psych: Doesn't report anxiety  Physical Exam: Vitals:   03/29/23 1110  BP: 138/86  Pulse: 72  Resp: 20  Temp: 98.1 F (36.7 C)  SpO2: 93%    KPS: 90. General: Alert, cooperative, pleasant, in no acute distress Head: Normal EENT: No conjunctival injection or scleral icterus.  Lungs: Resp effort normal Cardiac: Regular rate Abdomen: Non-distended abdomen Skin: No rashes cyanosis or  petechiae. Extremities: No clubbing or edema  Neurologic Exam: Mental Status: Awake, alert, attentive to examiner. Oriented to self and environment. Language is fluent with intact comprehension.  Cranial Nerves: Visual acuity is grossly normal. Visual fields are full. Extra-ocular movements intact. No ptosis. Face is symmetric Motor: Tone and bulk are normal. Power is 5/5 in left arm and leg. Reflexes are symmetric, no pathologic reflexes present.  Sensory: Intact to light touch Gait: Independent   Labs: I have reviewed the data as listed    Component Value Date/Time   NA 141 03/09/2023 0758   K 3.3 (L) 03/09/2023 0758   CL 104 03/09/2023 0758   CO2 29 03/09/2023 0758   GLUCOSE 122 (H) 03/09/2023 0758   BUN 13 03/09/2023 0758   CREATININE 1.12 03/09/2023 0758   CALCIUM 9.0 03/09/2023 0758   PROT 6.8 03/09/2023 0758   ALBUMIN 4.2 03/09/2023 0758   AST 14 (L) 03/09/2023 0758   ALT 16 03/09/2023 0758   ALKPHOS 65 03/09/2023 0758   BILITOT 0.4 03/09/2023 0758   GFRNONAA >60 03/09/2023 0758   Lab Results  Component Value Date   WBC 6.7 03/09/2023   NEUTROABS 4.6 03/09/2023   HGB 13.5 03/09/2023   HCT 38.6 (L) 03/09/2023   MCV 93.7 03/09/2023   PLT 146 (L) 03/09/2023    Imaging:  [facility] Clinician Interpretation: I have personally reviewed the CNS images as listed.  My interpretation, in the context of the patient's clinical presentation, is stable disease pending official read  NM PET Image Restag (PS) Skull Base To Thigh  Result Date: 03/23/2023 CLINICAL DATA:  Subsequent treatment strategy for non-small cell lung cancer, on immunotherapy. EXAM: NUCLEAR MEDICINE PET SKULL BASE TO THIGH TECHNIQUE: 14.0 mCi F-18 FDG was injected intravenously. Full-ring PET imaging was performed from the skull base to thigh after the radiotracer. CT data was obtained and used for attenuation correction and anatomic localization. Fasting blood glucose: 98 mg/dl COMPARISON:  PET-CT dated 06/23/2022  FINDINGS: Mediastinal blood pool activity: SUV max 2.9 Liver activity: SUV max NA NECK: No hypermetabolic cervical lymphadenopathy. Incidental CT findings: None. CHEST: 2.4 x 1.7 cm right upper lobe pulmonary nodule (series 7/image 13) with max SUV 2.7, previously 2.5 cm with max SUV 2.1. Adjacent 2.1 x 0.9 cm irregular nodule medially (series 7/image 14) with max SUV 2.0, previously 1.7 cm with  max SUV 1.7. These are both considered unchanged. No hypermetabolic mediastinal lymphadenopathy. Small bilateral axillary nodes, measuring up to 8 mm short axis on the left (series 4/image 53), max SUV 2.4, previously 2.6. Right chest port terminates at the cavoatrial junction. Incidental CT findings: None. ABDOMEN/PELVIS: No abnormal hypermetabolism in the liver, spleen, pancreas, or adrenal glands. No suspicious abdominal lymphadenopathy. Small bilateral external iliac nodes, including a 10 mm short axis node on the left (series 4/image 185) with max SUV 2.8. Previously, a right external iliac node demonstrated max SUV 4.3, currently 2.4. Incidental CT findings: Atherosclerotic calcifications abdominal aorta and branch vessels. Thick-walled bladder, although underdistended. SKELETON: No focal hypermetabolic activity to suggest skeletal metastasis. Incidental CT findings: Mild degenerative changes of the visualized thoracolumbar spine. IMPRESSION: Stable right upper lobe pulmonary nodules with equivocal/low level hypermetabolism, as above. Small bilateral axillary nodes, grossly unchanged, favored to be reactive. Small bilateral external iliac nodes, possibly mildly improved. This would be an unusual site for nodal metastases and is favored to be reactive. No evidence of new/progressive metastatic disease. Electronically Signed   By: Charline Bills M.D.   On: 03/23/2023 00:00     Assessment/Plan Non-small cell lung cancer metastatic to brain [facility])  Kevin Hunter is clinically stable today.  MRI brain  demonstrates overall stable findings, pending official read.  Decadron should remain off, if tolerated.  Dr. Myna Hidalgo will con't to follow.  We ask that Kevin Hunter return to clinic in 6 months with MRI brain, or sooner as needed.  All questions were answered. The patient knows to call the clinic with any problems, questions or concerns. No barriers to learning were detected.  The total time spent in the encounter was 30 minutes and more than 50% was on counseling and review of test results   Henreitta Leber, MD Medical Director of Neuro-Oncology [facility] 03/29/23 11:13 AM

## 2023-03-30 ENCOUNTER — Other Ambulatory Visit: Payer: Self-pay | Admitting: Radiation Therapy

## 2023-03-31 ENCOUNTER — Other Ambulatory Visit: Payer: Self-pay

## 2023-04-06 ENCOUNTER — Inpatient Hospital Stay: Payer: 59

## 2023-04-06 ENCOUNTER — Inpatient Hospital Stay: Payer: 59 | Admitting: Hematology & Oncology

## 2023-04-06 ENCOUNTER — Other Ambulatory Visit: Payer: Self-pay

## 2023-04-06 ENCOUNTER — Encounter: Payer: Self-pay | Admitting: Hematology & Oncology

## 2023-04-06 VITALS — BP 136/89 | HR 64 | Temp 97.5°F | Resp 18

## 2023-04-06 DIAGNOSIS — C3491 Malignant neoplasm of unspecified part of right bronchus or lung: Secondary | ICD-10-CM

## 2023-04-06 DIAGNOSIS — Z5112 Encounter for antineoplastic immunotherapy: Secondary | ICD-10-CM | POA: Diagnosis not present

## 2023-04-06 LAB — CMP (CANCER CENTER ONLY)
ALT: 19 U/L (ref 0–44)
AST: 15 U/L (ref 15–41)
Albumin: 3.9 g/dL (ref 3.5–5.0)
Alkaline Phosphatase: 69 U/L (ref 38–126)
Anion gap: 7 (ref 5–15)
BUN: 16 mg/dL (ref 6–20)
CO2: 27 mmol/L (ref 22–32)
Calcium: 8.2 mg/dL — ABNORMAL LOW (ref 8.9–10.3)
Chloride: 104 mmol/L (ref 98–111)
Creatinine: 0.99 mg/dL (ref 0.61–1.24)
GFR, Estimated: >60 mL/min (ref >60)
Glucose, Bld: 77 mg/dL (ref 70–99)
Potassium: 3.5 mmol/L (ref 3.5–5.1)
Sodium: 138 mmol/L (ref 135–145)
Total Bilirubin: 0.3 mg/dL (ref 0.3–1.2)
Total Protein: 7.1 g/dL (ref 6.5–8.1)

## 2023-04-06 LAB — LACTATE DEHYDROGENASE: LDH: 186 U/L (ref 98–192)

## 2023-04-06 LAB — CBC WITH DIFFERENTIAL (CANCER CENTER ONLY)
Abs Immature Granulocytes: 0.06 10*3/uL (ref 0.00–0.07)
Basophils Absolute: 0.1 10*3/uL (ref 0.0–0.1)
Basophils Relative: 1 %
Eosinophils Absolute: 0.2 10*3/uL (ref 0.0–0.5)
Eosinophils Relative: 3 %
HCT: 39.8 % (ref 39.0–52.0)
Hemoglobin: 13.6 g/dL (ref 13.0–17.0)
Immature Granulocytes: 1 %
Lymphocytes Relative: 16 %
Lymphs Abs: 1.3 10*3/uL (ref 0.7–4.0)
MCH: 32.3 pg (ref 26.0–34.0)
MCHC: 34.2 g/dL (ref 30.0–36.0)
MCV: 94.5 fL (ref 80.0–100.0)
Monocytes Absolute: 0.8 10*3/uL (ref 0.1–1.0)
Monocytes Relative: 10 %
Neutro Abs: 5.6 10*3/uL (ref 1.7–7.7)
Neutrophils Relative %: 69 %
Platelet Count: 177 10*3/uL (ref 150–400)
RBC: 4.21 MIL/uL — ABNORMAL LOW (ref 4.22–5.81)
RDW: 14.3 % (ref 11.5–15.5)
WBC Count: 8 10*3/uL (ref 4.0–10.5)
nRBC: 0 % (ref 0.0–0.2)

## 2023-04-06 LAB — TSH: TSH: 2.058 u[IU]/mL (ref 0.350–4.500)

## 2023-04-06 MED ORDER — SODIUM CHLORIDE 0.9 % IV SOLN: Freq: Once | INTRAVENOUS | Status: AC

## 2023-04-06 MED ORDER — HEPARIN SOD (PORK) LOCK FLUSH 100 UNIT/ML IV SOLN
500.0000 [IU] | Freq: Once | INTRAVENOUS | Status: AC | PRN
  Administered 2023-04-06: 500 [IU]

## 2023-04-06 MED ORDER — SODIUM CHLORIDE 0.9% FLUSH
10.0000 mL | INTRAVENOUS | Status: DC | PRN
  Administered 2023-04-06: 10 mL

## 2023-04-06 MED ORDER — SODIUM CHLORIDE 0.9 % IV SOLN
480.0000 mg | Freq: Once | INTRAVENOUS | Status: AC
  Administered 2023-04-06: 480 mg via INTRAVENOUS
  Filled 2023-04-06: qty 48

## 2023-04-06 NOTE — Patient Instructions (Signed)
Nivolumab Injection What is this medication? NIVOLUMAB (nye VOL ue mab) treats some types of cancer. It works by helping your immune system slow or stop the spread of cancer cells. It is a monoclonal antibody. This medicine may be used for other purposes; ask your health care provider or pharmacist if you have questions. COMMON BRAND NAME(S): Opdivo What should I tell my care team before I take this medication? They need to know if you have any of these conditions: Allogeneic stem cell transplant (uses someone else's stem cells) Autoimmune diseases, such as Crohn disease, ulcerative colitis, lupus History of chest radiation Nervous system problems, such as Guillain-Barre syndrome or myasthenia gravis Organ transplant An unusual or allergic reaction to nivolumab, other medications, foods, dyes, or preservatives Pregnant or trying to get pregnant Breast-feeding How should I use this medication? This medication is infused into a vein. It is given in a hospital or clinic setting. A special MedGuide will be given to you before each treatment. Be sure to read this information carefully each time. Talk to your care team about the use of this medication in children. While it may be prescribed for children as young as 12 years for selected conditions, precautions do apply. Overdosage: If you think you have taken too much of this medicine contact a poison control center or emergency room at once. NOTE: This medicine is only for you. Do not share this medicine with others. What if I miss a dose? Keep appointments for follow-up doses. It is important not to miss your dose. Call your care team if you are unable to keep an appointment. What may interact with this medication? Interactions have not been studied. This list may not describe all possible interactions. Give your health care provider a list of all the medicines, herbs, non-prescription drugs, or dietary supplements you use. Also tell them if you  smoke, drink alcohol, or use illegal drugs. Some items may interact with your medicine. What should I watch for while using this medication? Your condition will be monitored carefully while you are receiving this medication. You may need blood work while taking this medication. This medication may cause serious skin reactions. They can happen weeks to months after starting the medication. Contact your care team right away if you notice fevers or flu-like symptoms with a rash. The rash may be red or purple and then turn into blisters or peeling of the skin. You may also notice a red rash with swelling of the face, lips, or lymph nodes in your neck or under your arms. Tell your care team right away if you have any change in your eyesight. Talk to your care team if you are pregnant or think you might be pregnant. A negative pregnancy test is required before starting this medication. A reliable form of contraception is recommended while taking this medication and for 5 months after the last dose. Talk to your care team about effective forms of contraception. Do not breast-feed while taking this medication and for 5 months after the last dose. What side effects may I notice from receiving this medication? Side effects that you should report to your care team as soon as possible: Allergic reactions--skin rash, itching, hives, swelling of the face, lips, tongue, or throat Dry cough, shortness of breath or trouble breathing Eye pain, redness, irritation, or discharge with blurry or decreased vision Heart muscle inflammation--unusual weakness or fatigue, shortness of breath, chest pain, fast or irregular heartbeat, dizziness, swelling of the ankles, feet, or hands Hormone   gland problems--headache, sensitivity to light, unusual weakness or fatigue, dizziness, fast or irregular heartbeat, increased sensitivity to cold or heat, excessive sweating, constipation, hair loss, increased thirst or amount of urine,  tremors or shaking, irritability Infusion reactions--chest pain, shortness of breath or trouble breathing, feeling faint or lightheaded Kidney injury (glomerulonephritis)--decrease in the amount of urine, red or dark brown urine, foamy or bubbly urine, swelling of the ankles, hands, or feet Liver injury--right upper belly pain, loss of appetite, nausea, light-colored stool, dark yellow or brown urine, yellowing skin or eyes, unusual weakness or fatigue Pain, tingling, or numbness in the hands or feet, muscle weakness, change in vision, confusion or trouble speaking, loss of balance or coordination, trouble walking, seizures Rash, fever, and swollen lymph nodes Redness, blistering, peeling, or loosening of the skin, including inside the mouth Sudden or severe stomach pain, bloody diarrhea, fever, nausea, vomiting Side effects that usually do not require medical attention (report these to your care team if they continue or are bothersome): Bone, joint, or muscle pain Diarrhea Fatigue Loss of appetite Nausea Skin rash This list may not describe all possible side effects. Call your doctor for medical advice about side effects. You may report side effects to FDA at 1-800-FDA-1088. Where should I keep my medication? This medication is given in a hospital or clinic. It will not be stored at home. NOTE: This sheet is a summary. It may not cover all possible information. If you have questions about this medicine, talk to your doctor, pharmacist, or health care provider.  2024 Elsevier/Gold Standard (2021-11-30 00:00:00)  

## 2023-04-06 NOTE — Progress Notes (Signed)
Hematology and Oncology Follow Up Visit  Kevin Hunter [id number] 1965/09/16 57 y.o. 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  As always, he looks fantastic.Marland Kitchen  He had a PET scan that was done.  This was done on 03/16/2023.  Thankfully, the PET scan did not show any evidence of tumor progression.  There was a nodule in the right upper lung which measured 2.4 x 1.7 cm.  It had an SUV of 2.7.  Also noted was a noted nodule in the right upper lobe and measured 2.1 x 0.9 cm.  This also was stable with an SUV of 2.  No metastatic adenopathy was noted in the mediastinum.  Everything looked good in the bones and in the liver.  He saw Dr. Barbaraann Cao on 03/28/2023.  An MRI of the brain on 03/24/2023.  Surprising, this is not yet read.  However, he was told that everything was fine with no evidence of active brain disease.  He is still working.  He is having no problems with fatigue or weakness.  He is having no problems with cough or shortness of breath.    His last TSH was 2.5.  This was in July.  He has had no change in bowel or bladder habits.  He has had no bleeding.  He has had no leg swelling.  He has had no rashes.  There is no problems with pain.  Overall, I would say that his performance status is probably ECOG 0.    Medications:  Allergies as of 04/06/2023       Reactions   Paclitaxel Anaphylaxis   Penicillins Other (See Comments)   Unknown Reaction when he was an infant.        Medication List         Accurate as of April 06, 2023 11:31 AM. If you have any questions, ask your nurse or doctor.          amLODipine 5 MG tablet Commonly known as: NORVASC Take 5 mg by mouth at bedtime.   clonazePAM 1 MG tablet Commonly known as: KLONOPIN Take 1 mg by mouth 2 (two) times daily as needed for anxiety (May take additional 0.5mg  as needed for panic attack).   cyclobenzaprine 10 MG tablet Commonly known as: FLEXERIL Take 1 tablet (10 mg total) by mouth 3 (three) times daily as needed for muscle spasms.   pantoprazole 40 MG tablet Commonly known as: Protonix Take 1 tablet (40 mg total) by mouth 2 (two) times daily before a meal.   pseudoephedrine 30 MG tablet Commonly known as: SUDAFED Take 60 mg by mouth every 4 (four) hours as needed (headache).   psyllium 58.6 % packet Commonly known as: METAMUCIL Take 1 packet by mouth daily.   QUEtiapine 400 MG 24 hr tablet Commonly known as: SEROQUEL XR Take 400 mg by mouth at bedtime.   sildenafil 20 MG tablet Commonly known as: REVATIO 2-5 tablets Orally daily as needed for 90 days  solifenacin 10 MG tablet Commonly known as: VESICARE Take 10 mg by mouth daily.   traZODone 100 MG tablet Commonly known as: DESYREL Take 300 mg by mouth at bedtime.   venlafaxine 75 MG tablet Commonly known as: EFFEXOR Take 150 mg by mouth at bedtime.        Allergies:  Allergies  Allergen Reactions   Paclitaxel Anaphylaxis   Penicillins Other (See Comments)    Unknown Reaction when he was an infant.    Past Medical History, Surgical history, Social history, and Family History were reviewed and updated.  Review of Systems: Review of Systems  Constitutional: Negative.   HENT: Negative.    Eyes: Negative.   Respiratory: Negative.    Cardiovascular: Negative.   Gastrointestinal: Negative.   Genitourinary: Negative.   Musculoskeletal: Negative.   Skin: Negative.   Neurological:  Positive for tingling.  Endo/Heme/Allergies:  Negative.   Psychiatric/Behavioral: Negative.        Physical Exam: Vital signs are temperature of 98.1.  Pulse 73.  Blood pressure 118/81.  Weight is 284 pounds.     Wt Readings from Last 3 Encounters:  04/06/23 284 lb (128.8 kg)  03/29/23 286 lb (129.7 kg)  03/09/23 290 lb (131.5 kg)    Physical Exam Vitals reviewed.  HENT:     Head: Normocephalic and atraumatic.  Eyes:     Pupils: Pupils are equal, round, and reactive to light.  Cardiovascular:     Rate and Rhythm: Normal rate and regular rhythm.     Heart sounds: Normal heart sounds.  Pulmonary:     Effort: Pulmonary effort is normal.     Breath sounds: Normal breath sounds.  Abdominal:     General: Bowel sounds are normal.     Palpations: Abdomen is soft.  Musculoskeletal:        General: No tenderness or deformity. Normal range of motion.     Cervical back: Normal range of motion.  Lymphadenopathy:     Cervical: No cervical adenopathy.  Skin:    General: Skin is warm and dry.     Findings: No erythema or rash.  Neurological:     Mental Status: He is alert and oriented to person, place, and time.  Psychiatric:        Behavior: Behavior normal.        Thought Content: Thought content normal.        Judgment: Judgment normal.     Lab Results  Component Value Date   WBC 8.0 04/06/2023   HGB 13.6 04/06/2023   HCT 39.8 04/06/2023   MCV 94.5 04/06/2023   PLT 177 04/06/2023   No results found for: "FERRITIN", "IRON", "TIBC", "UIBC", "IRONPCTSAT" Lab Results  Component Value Date   RBC 4.21 (L) 04/06/2023   No results found for: "KPAFRELGTCHN", "LAMBDASER", "KAPLAMBRATIO" No results found for: "IGGSERUM", "IGA", "IGMSERUM" No results found for: "TOTALPROTELP", "ALBUMINELP", "A1GS", "A2GS", "BETS", "BETA2SER", "GAMS", "MSPIKE", "SPEI"   Chemistry      Component Value Date/Time   NA 138 04/06/2023 0910   K 3.5 04/06/2023 0910   CL 104 04/06/2023 0910   CO2 27 04/06/2023 0910   BUN 16 04/06/2023 0910    CREATININE 0.99 04/06/2023 0910      Component Value Date/Time   CALCIUM 8.2 (L) 04/06/2023 0910   ALKPHOS 69 04/06/2023 0910   AST 15 04/06/2023 0910   ALT 19 04/06/2023 0910   BILITOT 0.3 04/06/2023 0910       Impression and Plan: Kevin Hunter is a very pleasant 57 yo caucasian gentleman with 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 He completed his chemotherapy along with immunotherapy.   He now is on maintenance therapy with nivolumab and tolerating well with a nice response.   I am just very present he is done so well.  He is doing well with the maintenance nivolumab.  He has had no complications from this.  Will go ahead and plan to get him back in another month.   8/21/202411:31 AM

## 2023-04-07 LAB — T4: T4, Total: 6.1 ug/dL (ref 4.5–12.0)

## 2023-05-05 ENCOUNTER — Inpatient Hospital Stay: Payer: 59

## 2023-05-05 ENCOUNTER — Inpatient Hospital Stay: Payer: 59 | Attending: Internal Medicine

## 2023-05-05 ENCOUNTER — Encounter: Payer: Self-pay | Admitting: Family

## 2023-05-05 ENCOUNTER — Encounter: Payer: Self-pay | Admitting: Hematology & Oncology

## 2023-05-05 ENCOUNTER — Inpatient Hospital Stay (HOSPITAL_BASED_OUTPATIENT_CLINIC_OR_DEPARTMENT_OTHER): Payer: 59 | Admitting: Hematology & Oncology

## 2023-05-05 VITALS — BP 127/85 | HR 70 | Temp 98.0°F | Resp 18 | Ht 71.0 in | Wt 289.0 lb

## 2023-05-05 DIAGNOSIS — C7970 Secondary malignant neoplasm of unspecified adrenal gland: Secondary | ICD-10-CM | POA: Insufficient documentation

## 2023-05-05 DIAGNOSIS — C7931 Secondary malignant neoplasm of brain: Secondary | ICD-10-CM | POA: Insufficient documentation

## 2023-05-05 DIAGNOSIS — Z5112 Encounter for antineoplastic immunotherapy: Secondary | ICD-10-CM | POA: Diagnosis present

## 2023-05-05 DIAGNOSIS — Z79899 Other long term (current) drug therapy: Secondary | ICD-10-CM | POA: Diagnosis not present

## 2023-05-05 DIAGNOSIS — C3491 Malignant neoplasm of unspecified part of right bronchus or lung: Secondary | ICD-10-CM

## 2023-05-05 DIAGNOSIS — C7800 Secondary malignant neoplasm of unspecified lung: Secondary | ICD-10-CM | POA: Diagnosis not present

## 2023-05-05 DIAGNOSIS — C3411 Malignant neoplasm of upper lobe, right bronchus or lung: Secondary | ICD-10-CM | POA: Insufficient documentation

## 2023-05-05 LAB — CMP (CANCER CENTER ONLY)
ALT: 20 U/L (ref 0–44)
AST: 16 U/L (ref 15–41)
Albumin: 4.2 g/dL (ref 3.5–5.0)
Alkaline Phosphatase: 70 U/L (ref 38–126)
Anion gap: 7 (ref 5–15)
BUN: 11 mg/dL (ref 6–20)
CO2: 30 mmol/L (ref 22–32)
Calcium: 8.8 mg/dL — ABNORMAL LOW (ref 8.9–10.3)
Chloride: 103 mmol/L (ref 98–111)
Creatinine: 0.98 mg/dL (ref 0.61–1.24)
GFR, Estimated: >60 mL/min (ref >60)
Glucose, Bld: 99 mg/dL (ref 70–99)
Potassium: 3 mmol/L — ABNORMAL LOW (ref 3.5–5.1)
Sodium: 140 mmol/L (ref 135–145)
Total Bilirubin: 0.4 mg/dL (ref 0.3–1.2)
Total Protein: 6.8 g/dL (ref 6.5–8.1)

## 2023-05-05 LAB — CBC WITH DIFFERENTIAL (CANCER CENTER ONLY)
Abs Immature Granulocytes: 0.03 10*3/uL (ref 0.00–0.07)
Basophils Absolute: 0.1 10*3/uL (ref 0.0–0.1)
Basophils Relative: 1 %
Eosinophils Absolute: 0.2 10*3/uL (ref 0.0–0.5)
Eosinophils Relative: 2 %
HCT: 40.8 % (ref 39.0–52.0)
Hemoglobin: 14.1 g/dL (ref 13.0–17.0)
Immature Granulocytes: 0 %
Lymphocytes Relative: 15 %
Lymphs Abs: 1.1 10*3/uL (ref 0.7–4.0)
MCH: 32.3 pg (ref 26.0–34.0)
MCHC: 34.6 g/dL (ref 30.0–36.0)
MCV: 93.6 fL (ref 80.0–100.0)
Monocytes Absolute: 0.6 10*3/uL (ref 0.1–1.0)
Monocytes Relative: 8 %
Neutro Abs: 5.4 10*3/uL (ref 1.7–7.7)
Neutrophils Relative %: 74 %
Platelet Count: 153 10*3/uL (ref 150–400)
RBC: 4.36 MIL/uL (ref 4.22–5.81)
RDW: 13.7 % (ref 11.5–15.5)
WBC Count: 7.4 10*3/uL (ref 4.0–10.5)
nRBC: 0 % (ref 0.0–0.2)

## 2023-05-05 LAB — TSH: TSH: 2.121 u[IU]/mL (ref 0.350–4.500)

## 2023-05-05 LAB — LACTATE DEHYDROGENASE: LDH: 158 U/L (ref 98–192)

## 2023-05-05 MED ORDER — SODIUM CHLORIDE 0.9% FLUSH
10.0000 mL | INTRAVENOUS | Status: DC | PRN
  Administered 2023-05-05: 10 mL

## 2023-05-05 MED ORDER — SODIUM CHLORIDE 0.9 % IV SOLN: Freq: Once | INTRAVENOUS | Status: AC

## 2023-05-05 MED ORDER — HEPARIN SOD (PORK) LOCK FLUSH 100 UNIT/ML IV SOLN
500.0000 [IU] | Freq: Once | INTRAVENOUS | Status: AC | PRN
  Administered 2023-05-05: 500 [IU]

## 2023-05-05 MED ORDER — SODIUM CHLORIDE 0.9 % IV SOLN
480.0000 mg | Freq: Once | INTRAVENOUS | Status: AC
  Administered 2023-05-05: 480 mg via INTRAVENOUS
  Filled 2023-05-05: qty 48

## 2023-05-05 NOTE — Patient Instructions (Signed)
Implanted [facility] Guide An implanted port is a device that is placed under the skin. It is usually placed in the chest. The device may vary based on the need. Implanted ports can be used to give IV medicine, to take blood, or to give fluids. You may have an implanted port if: You need IV medicine that would be irritating to the small veins in your hands or arms. You need IV medicines, such as chemotherapy, for a long period of time. You need IV nutrition for a long period of time. You may have fewer limitations when using a port than you would if you used other types of long-term IVs. You will also likely be able to return to normal activities after your incision heals. An implanted port has two main parts: Reservoir. The reservoir is the part where a needle is inserted to give medicines or draw blood. The reservoir is round. After the port is placed, it appears as a small, raised area under your skin. Catheter. The catheter is a small, thin tube that connects the reservoir to a vein. Medicine that is inserted into the reservoir goes into the catheter and then into the vein. How is my port accessed? To access your port: A numbing cream may be placed on the skin over the port site. Your health care provider will put on a mask and sterile gloves. The skin over your port will be cleaned carefully with a germ-killing soap and allowed to dry. Your health care provider will gently pinch the port and insert a needle into it. Your health care provider will check for a blood return to make sure the port is in the vein and is still working (patent). If your port needs to remain accessed to get medicine continuously (constant infusion), your health care provider will place a clear bandage (dressing) over the needle site. The dressing and needle will need to be changed every week, or as told by your health care provider. What is flushing? Flushing helps keep the port working. Follow instructions from your  health care provider about how and when to flush the port. Ports are usually flushed with saline solution or a medicine called heparin. The need for flushing will depend on how the port is used: If the port is only used from time to time to give medicines or draw blood, the port may need to be flushed: Before and after medicines have been given. Before and after blood has been drawn. As part of routine maintenance. Flushing may be recommended every 4-6 weeks. If a constant infusion is running, the port may not need to be flushed. Throw away any syringes in a disposal container that is meant for sharp items (sharps container). You can buy a sharps container from a pharmacy, or you can make one by using an empty hard plastic bottle with a cover. How long will my port stay implanted? The port can stay in for as long as your health care provider thinks it is needed. When it is time for the port to come out, a surgery will be done to remove it. The surgery will be similar to the procedure that was done to put the port in. Follow these instructions at home: Caring for your port and port site Flush your port as told by your health care provider. If you need an infusion over several days, follow instructions from your health care provider about how to take care of your port site. Make sure you: Change your  dressing as told by your health care provider. Wash your hands with soap and water for at least 20 seconds before and after you change your dressing. If soap and water are not available, use alcohol-based hand sanitizer. Place any used dressings or infusion bags into a plastic bag. Throw that bag in the trash. Keep the dressing that covers the needle clean and dry. Do not get it wet. Do not use scissors or sharp objects near the infusion tubing. Keep any external tubes clamped, unless they are being used. Check your port site every day for signs of infection. Check for: Redness, swelling, or  pain. Fluid or blood. Warmth. Pus or a bad smell. Protect the skin around the port site. Avoid wearing bra straps that rub or irritate the site. Protect the skin around your port from seat belts. Place a soft pad over your chest if needed. Bathe or shower as told by your health care provider. The site may get wet as long as you are not actively receiving an infusion. General instructions  Return to your normal activities as told by your health care provider. Ask your health care provider what activities are safe for you. Carry a medical alert card or wear a medical alert bracelet at all times. This will let health care providers know that you have an implanted port in case of an emergency. Where to find more information American Cancer Society: www.cancer.org American Society of Clinical Oncology: www.cancer.net Contact a health care provider if: You have a fever or chills. You have redness, swelling, or pain at the port site. You have fluid or blood coming from your port site. Your incision feels warm to the touch. You have pus or a bad smell coming from the port site. Summary Implanted ports are usually placed in the chest for long-term IV access. Follow instructions from your health care provider about flushing the port and changing bandages (dressings). Take care of the area around your port by avoiding clothing that puts pressure on the area, and by watching for signs of infection. Protect the skin around your port from seat belts. Place a soft pad over your chest if needed. Contact a health care provider if you have a fever or you have redness, swelling, pain, fluid, or a bad smell at the port site. This information is not intended to replace advice given to you by your health care provider. Make sure you discuss any questions you have with your health care provider. Document Revised: 02/03/2021 Document Reviewed: 02/03/2021 Elsevier Patient Education  2024 [employer].

## 2023-05-05 NOTE — Patient Instructions (Signed)
[facility]  Discharge Instructions: Thank you for choosing [facility] to provide your oncology and hematology care.   If you have a lab appointment with the Cancer Center, please go directly to the Cancer Center and check in at the registration area.  Wear comfortable clothing and clothing appropriate for easy access to any Portacath or PICC line.   We strive to give you quality time with your provider. You may need to reschedule your appointment if you arrive late (15 or more minutes).  Arriving late affects you and other patients whose appointments are after yours.  Also, if you miss three or more appointments without notifying the office, you may be dismissed from the clinic at the provider's discretion.      For prescription refill requests, have your pharmacy contact our office and allow 72 hours for refills to be completed.    Today you received the following chemotherapy and/or immunotherapy agents opdivo       To help prevent nausea and vomiting after your treatment, we encourage you to take your nausea medication as directed.  BELOW ARE SYMPTOMS THAT SHOULD BE REPORTED IMMEDIATELY: *FEVER GREATER THAN 100.4 F (38 C) OR HIGHER *CHILLS OR SWEATING *NAUSEA AND VOMITING THAT IS NOT CONTROLLED WITH YOUR NAUSEA MEDICATION *UNUSUAL SHORTNESS OF BREATH *UNUSUAL BRUISING OR BLEEDING *URINARY PROBLEMS (pain or burning when urinating, or frequent urination) *BOWEL PROBLEMS (unusual diarrhea, constipation, pain near the anus) TENDERNESS IN MOUTH AND THROAT WITH OR WITHOUT PRESENCE OF ULCERS (sore throat, sores in mouth, or a toothache) UNUSUAL RASH, SWELLING OR PAIN  UNUSUAL VAGINAL DISCHARGE OR ITCHING   Items with * indicate a potential emergency and should be followed up as soon as possible or go to the Emergency Department if any problems should occur.  Please show the CHEMOTHERAPY ALERT CARD or IMMUNOTHERAPY ALERT CARD at check-in  to the Emergency Department and triage nurse. Should you have questions after your visit or need to cancel or reschedule your appointment, please contact [facility]  [phone] and follow the prompts.  Office hours are 8:00 a.m. to 4:30 p.m. Monday - Friday. Please note that voicemails left after 4:00 p.m. may not be returned until the following business day.  We are closed weekends and major holidays. You have access to a nurse at all times for urgent questions. Please call the main number to the clinic [phone] and follow the prompts.  For any non-urgent questions, you may also contact your provider using MyChart. We now offer e-Visits for anyone 18 and older to request care online for non-urgent symptoms. For details visit [email or web address].   Also download the MyChart app! Go to the app store, search "MyChart", open the app, select [place], and log in with your MyChart username and password.   

## 2023-05-05 NOTE — Progress Notes (Signed)
Hematology and Oncology Follow Up Visit  Kevin Hunter [id number] [birth year] 57 y.o. 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  As always, he looks fantastic.Marland Kitchen  Hopefully, he will be able to spend a nice weekend in [place] with his daughter.  She goes to [employer].  He has had no problem with headaches.  He has had no problems with nausea or vomiting.  He is still working.  He is working quite a few hours.  There is no change in bowel or bladder habits.  He has had no rashes.  He has had no leg swelling.  His last TSH back in August was 2.1.  He has had no bleeding.  His last MRI was done on 03/24/2023.  This actually showed continued improvement in the CNS metastasis.  Overall, I would say performance status is probably ECOG 0.  Medications:  Allergies as of 05/05/2023       Reactions   Paclitaxel Anaphylaxis   Penicillins Other (See Comments)   Unknown Reaction when he was an infant.        Medication List        Accurate as of May 05, 2023  8:28 AM. If you have any questions, ask your nurse or doctor.          amLODipine 5 MG tablet Commonly known as: NORVASC Take 5 mg by mouth at bedtime.   clonazePAM 1 MG tablet Commonly known as: KLONOPIN Take 1 mg by mouth 2 (two) times daily as needed for anxiety (May take additional 0.5mg  as needed for panic attack).   cyclobenzaprine 10 MG tablet Commonly known as: FLEXERIL Take 1 tablet (10 mg total) by mouth 3 (three) times daily as  needed for muscle spasms.   pantoprazole 40 MG tablet Commonly known as: Protonix Take 1 tablet (40 mg total) by mouth 2 (two) times daily before a meal.   pseudoephedrine 30 MG tablet Commonly known as: SUDAFED Take 60 mg by mouth every 4 (four) hours as needed (headache).   psyllium 58.6 % packet Commonly known as: METAMUCIL Take 1 packet by mouth daily.   QUEtiapine 400 MG 24 hr tablet Commonly known as: SEROQUEL XR Take 400 mg by mouth at bedtime.   sildenafil 20 MG tablet Commonly known as: REVATIO 2-5 tablets Orally daily as needed for 90 days   solifenacin 10 MG tablet Commonly known as: VESICARE Take 10 mg by mouth daily.   traZODone 100 MG tablet Commonly known as: DESYREL Take 300 mg by mouth at bedtime.   venlafaxine 75 MG tablet Commonly known as: EFFEXOR Take 150 mg by mouth at bedtime.        Allergies:  Allergies  Allergen Reactions   Paclitaxel Anaphylaxis   Penicillins Other (See Comments)    Unknown Reaction when he was an infant.    Past Medical History, Surgical history, Social history, and Family History were reviewed and updated.  Review of Systems: Review of Systems  Constitutional: Negative.  HENT: Negative.    Eyes: Negative.   Respiratory: Negative.    Cardiovascular: Negative.   Gastrointestinal: Negative.   Genitourinary: Negative.   Musculoskeletal: Negative.   Skin: Negative.   Neurological:  Positive for tingling.  Endo/Heme/Allergies: Negative.   Psychiatric/Behavioral: Negative.        Physical Exam: Vital signs are temperature of 98.0.  Pulse 70.  Blood pressure 127/85.  Weight is 289 pounds.     Wt Readings from Last 3 Encounters:  04/06/23 284 lb (128.8 kg)  03/29/23 286 lb (129.7 kg)  03/09/23 290 lb (131.5 kg)    Physical Exam Vitals reviewed.  HENT:     Head: Normocephalic and atraumatic.  Eyes:     Pupils: Pupils are equal, round, and reactive to light.  Cardiovascular:     Rate and Rhythm:  Normal rate and regular rhythm.     Heart sounds: Normal heart sounds.  Pulmonary:     Effort: Pulmonary effort is normal.     Breath sounds: Normal breath sounds.  Abdominal:     General: Bowel sounds are normal.     Palpations: Abdomen is soft.  Musculoskeletal:        General: No tenderness or deformity. Normal range of motion.     Cervical back: Normal range of motion.  Lymphadenopathy:     Cervical: No cervical adenopathy.  Skin:    General: Skin is warm and dry.     Findings: No erythema or rash.  Neurological:     Mental Status: He is alert and oriented to person, place, and time.  Psychiatric:        Behavior: Behavior normal.        Thought Content: Thought content normal.        Judgment: Judgment normal.     Lab Results  Component Value Date   WBC 7.4 05/05/2023   HGB 14.1 05/05/2023   HCT 40.8 05/05/2023   MCV 93.6 05/05/2023   PLT 153 05/05/2023   No results found for: "FERRITIN", "IRON", "TIBC", "UIBC", "IRONPCTSAT" Lab Results  Component Value Date   RBC 4.36 05/05/2023   No results found for: "KPAFRELGTCHN", "LAMBDASER", "KAPLAMBRATIO" No results found for: "IGGSERUM", "IGA", "IGMSERUM" No results found for: "TOTALPROTELP", "ALBUMINELP", "A1GS", "A2GS", "BETS", "BETA2SER", "GAMS", "MSPIKE", "SPEI"   Chemistry      Component Value Date/Time   NA 140 05/05/2023 0740   K 3.0 (L) 05/05/2023 0740   CL 103 05/05/2023 0740   CO2 30 05/05/2023 0740   BUN 11 05/05/2023 0740   CREATININE 0.98 05/05/2023 0740      Component Value Date/Time   CALCIUM 8.8 (L) 05/05/2023 0740   ALKPHOS 70 05/05/2023 0740   AST 16 05/05/2023 0740   ALT 20 05/05/2023 0740   BILITOT 0.4 05/05/2023 0740       Impression and Plan: Kevin Hunter is a very pleasant 57 yo caucasian gentleman with 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 He completed his chemotherapy along with immunotherapy.   He now is on maintenance therapy with nivolumab and tolerating  well with a nice response.   I am just very present he is done so well.  He is doing well with the maintenance nivolumab.  He has had no complications from this.  I really do not think that we have to get a PET scan on until October or November.  He really has done quite nicely.  His quality of life is superb.  Will go ahead  and plan to get him back in another month.   9/19/20248:28 AM

## 2023-05-06 ENCOUNTER — Other Ambulatory Visit: Payer: Self-pay

## 2023-05-06 MED ORDER — LIDOCAINE-PRILOCAINE 2.5-2.5 % EX CREA: 1.0000 | TOPICAL_CREAM | CUTANEOUS | 0 refills | Status: AC | PRN

## 2023-05-07 LAB — T4: T4, Total: 5.9 ug/dL (ref 4.5–12.0)

## 2023-06-02 ENCOUNTER — Inpatient Hospital Stay: Payer: 59 | Admitting: Hematology & Oncology

## 2023-06-02 ENCOUNTER — Inpatient Hospital Stay: Payer: 59

## 2023-06-02 ENCOUNTER — Inpatient Hospital Stay: Payer: 59 | Attending: Internal Medicine

## 2023-06-02 ENCOUNTER — Encounter: Payer: Self-pay | Admitting: Hematology & Oncology

## 2023-06-02 ENCOUNTER — Other Ambulatory Visit: Payer: Self-pay

## 2023-06-02 VITALS — BP 150/82 | HR 64

## 2023-06-02 DIAGNOSIS — C3491 Malignant neoplasm of unspecified part of right bronchus or lung: Secondary | ICD-10-CM

## 2023-06-02 DIAGNOSIS — Z79620 Long term (current) use of immunosuppressive biologic: Secondary | ICD-10-CM | POA: Diagnosis not present

## 2023-06-02 DIAGNOSIS — Z5112 Encounter for antineoplastic immunotherapy: Secondary | ICD-10-CM | POA: Insufficient documentation

## 2023-06-02 DIAGNOSIS — C3411 Malignant neoplasm of upper lobe, right bronchus or lung: Secondary | ICD-10-CM | POA: Insufficient documentation

## 2023-06-02 DIAGNOSIS — C7931 Secondary malignant neoplasm of brain: Secondary | ICD-10-CM | POA: Diagnosis not present

## 2023-06-02 LAB — CBC WITH DIFFERENTIAL (CANCER CENTER ONLY)
Abs Immature Granulocytes: 0.04 10*3/uL (ref 0.00–0.07)
Basophils Absolute: 0.1 10*3/uL (ref 0.0–0.1)
Basophils Relative: 1 %
Eosinophils Absolute: 0.1 10*3/uL (ref 0.0–0.5)
Eosinophils Relative: 2 %
HCT: 41.9 % (ref 39.0–52.0)
Hemoglobin: 14.5 g/dL (ref 13.0–17.0)
Immature Granulocytes: 1 %
Lymphocytes Relative: 16 %
Lymphs Abs: 1.2 10*3/uL (ref 0.7–4.0)
MCH: 32 pg (ref 26.0–34.0)
MCHC: 34.6 g/dL (ref 30.0–36.0)
MCV: 92.5 fL (ref 80.0–100.0)
Monocytes Absolute: 0.6 10*3/uL (ref 0.1–1.0)
Monocytes Relative: 8 %
Neutro Abs: 5.6 10*3/uL (ref 1.7–7.7)
Neutrophils Relative %: 72 %
Platelet Count: 162 10*3/uL (ref 150–400)
RBC: 4.53 MIL/uL (ref 4.22–5.81)
RDW: 13.8 % (ref 11.5–15.5)
WBC Count: 7.7 10*3/uL (ref 4.0–10.5)
nRBC: 0 % (ref 0.0–0.2)

## 2023-06-02 LAB — CMP (CANCER CENTER ONLY)
ALT: 15 U/L (ref 0–44)
AST: 15 U/L (ref 15–41)
Albumin: 4.2 g/dL (ref 3.5–5.0)
Alkaline Phosphatase: 74 U/L (ref 38–126)
Anion gap: 8 (ref 5–15)
BUN: 13 mg/dL (ref 6–20)
CO2: 28 mmol/L (ref 22–32)
Calcium: 8.8 mg/dL — ABNORMAL LOW (ref 8.9–10.3)
Chloride: 103 mmol/L (ref 98–111)
Creatinine: 1.15 mg/dL (ref 0.61–1.24)
GFR, Estimated: >60 mL/min (ref >60)
Glucose, Bld: 115 mg/dL — ABNORMAL HIGH (ref 70–99)
Potassium: 3.3 mmol/L — ABNORMAL LOW (ref 3.5–5.1)
Sodium: 139 mmol/L (ref 135–145)
Total Bilirubin: 0.3 mg/dL (ref 0.3–1.2)
Total Protein: 7 g/dL (ref 6.5–8.1)

## 2023-06-02 LAB — TSH: TSH: 1.446 u[IU]/mL (ref 0.350–4.500)

## 2023-06-02 MED ORDER — SODIUM CHLORIDE 0.9 % IV SOLN: Freq: Once | INTRAVENOUS | Status: AC

## 2023-06-02 MED ORDER — SODIUM CHLORIDE 0.9 % IV SOLN: Freq: Once | INTRAVENOUS | Status: DC

## 2023-06-02 MED ORDER — HEPARIN SOD (PORK) LOCK FLUSH 100 UNIT/ML IV SOLN
500.0000 [IU] | Freq: Once | INTRAVENOUS | Status: AC | PRN
  Administered 2023-06-02: 500 [IU]

## 2023-06-02 MED ORDER — SODIUM CHLORIDE 0.9% FLUSH
10.0000 mL | INTRAVENOUS | Status: DC | PRN
  Administered 2023-06-02: 10 mL

## 2023-06-02 MED ORDER — SODIUM CHLORIDE 0.9 % IV SOLN
480.0000 mg | Freq: Once | INTRAVENOUS | Status: AC
  Administered 2023-06-02: 480 mg via INTRAVENOUS
  Filled 2023-06-02: qty 48

## 2023-06-02 NOTE — Patient Instructions (Signed)
[facility]  Discharge Instructions: Thank you for choosing [facility] to provide your oncology and hematology care.   If you have a lab appointment with the Cancer Center, please go directly to the Cancer Center and check in at the registration area.  Wear comfortable clothing and clothing appropriate for easy access to any Portacath or PICC line.   We strive to give you quality time with your provider. You may need to reschedule your appointment if you arrive late (15 or more minutes).  Arriving late affects you and other patients whose appointments are after yours.  Also, if you miss three or more appointments without notifying the office, you may be dismissed from the clinic at the provider's discretion.      For prescription refill requests, have your pharmacy contact our office and allow 72 hours for refills to be completed.    Today you received the following chemotherapy and/or immunotherapy agents Opdivo.      To help prevent nausea and vomiting after your treatment, we encourage you to take your nausea medication as directed.  BELOW ARE SYMPTOMS THAT SHOULD BE REPORTED IMMEDIATELY: *FEVER GREATER THAN 100.4 F (38 C) OR HIGHER *CHILLS OR SWEATING *NAUSEA AND VOMITING THAT IS NOT CONTROLLED WITH YOUR NAUSEA MEDICATION *UNUSUAL SHORTNESS OF BREATH *UNUSUAL BRUISING OR BLEEDING *URINARY PROBLEMS (pain or burning when urinating, or frequent urination) *BOWEL PROBLEMS (unusual diarrhea, constipation, pain near the anus) TENDERNESS IN MOUTH AND THROAT WITH OR WITHOUT PRESENCE OF ULCERS (sore throat, sores in mouth, or a toothache) UNUSUAL RASH, SWELLING OR PAIN  UNUSUAL VAGINAL DISCHARGE OR ITCHING   Items with * indicate a potential emergency and should be followed up as soon as possible or go to the Emergency Department if any problems should occur.  Please show the CHEMOTHERAPY ALERT CARD or IMMUNOTHERAPY ALERT CARD at check-in  to the Emergency Department and triage nurse. Should you have questions after your visit or need to cancel or reschedule your appointment, please contact [facility]  [phone] and follow the prompts.  Office hours are 8:00 a.m. to 4:30 p.m. Monday - Friday. Please note that voicemails left after 4:00 p.m. may not be returned until the following business day.  We are closed weekends and major holidays. You have access to a nurse at all times for urgent questions. Please call the main number to the clinic [phone] and follow the prompts.  For any non-urgent questions, you may also contact your provider using MyChart. We now offer e-Visits for anyone 70 and older to request care online for non-urgent symptoms. For details visit [email or web address].   Also download the MyChart app! Go to the app store, search "MyChart", open the app, select [place], and log in with your MyChart username and password.

## 2023-06-02 NOTE — Progress Notes (Signed)
Hematology and Oncology Follow Up Visit  JAMESON ROWLETTE [id number] 1965/11/23 57 y.o. 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 Hubers is here today for follow-up and treatment.  He looks fantastic.  He feels good.  He really has had no complaints.  He has had no problem with headaches.  He has had no nausea or vomiting.  He has had no diarrhea.  He has had no cough or shortness of breath.  He is still working.  He is having no problems at work.  We are always check his TSH.  The last time that he was here, the TSH was 2.1.  He has had no bleeding.  He has had no pain issues.  He has had no rashes.  Overall, I would say that his performance status is probably ECOG 0. .  Medications:  Allergies as of 06/02/2023       Reactions   Paclitaxel Anaphylaxis   Penicillins Other (See Comments)   Unknown Reaction when he was an infant.        Medication List        Accurate as of June 02, 2023  8:48 AM. If you have any questions, ask your nurse or doctor.          amLODipine 5 MG tablet Commonly known as: NORVASC Take 5 mg by mouth at bedtime.   clonazePAM 1 MG tablet Commonly known as: KLONOPIN Take 1 mg by mouth 2 (two) times daily as needed for anxiety (May take additional 0.5mg  as needed for panic attack).   cyclobenzaprine 10 MG tablet Commonly known as: FLEXERIL Take 1 tablet (10 mg total) by mouth 3 (three) times daily as needed for muscle spasms.   lidocaine-prilocaine cream Commonly known as: EMLA Apply 1 Application  topically as needed.   pantoprazole 40 MG tablet Commonly known as: Protonix Take 1 tablet (40 mg total) by mouth 2 (two) times daily before a meal.   pseudoephedrine 30 MG tablet Commonly known as: SUDAFED Take 60 mg by mouth every 4 (four) hours as needed (headache).   psyllium 58.6 % packet Commonly known as: METAMUCIL Take 1 packet by mouth daily.   QUEtiapine 400 MG 24 hr tablet Commonly known as: SEROQUEL XR Take 400 mg by mouth at bedtime.   sildenafil 20 MG tablet Commonly known as: REVATIO 2-5 tablets Orally daily as needed for 90 days   solifenacin 10 MG tablet Commonly known as: VESICARE Take 10 mg by mouth daily.   traZODone 100 MG tablet Commonly known as: DESYREL Take 300 mg by mouth at bedtime.   venlafaxine 75 MG tablet Commonly known as: EFFEXOR Take 150 mg by mouth at bedtime.        Allergies:  Allergies  Allergen Reactions   Paclitaxel Anaphylaxis   Penicillins Other (See Comments)    Unknown Reaction when he was an infant.    Past Medical History, Surgical history, Social history, and Family History were reviewed and updated.  Review of Systems: Review of Systems  Constitutional: Negative.  HENT: Negative.    Eyes: Negative.   Respiratory: Negative.    Cardiovascular: Negative.   Gastrointestinal: Negative.   Genitourinary: Negative.   Musculoskeletal: Negative.   Skin: Negative.   Neurological:  Positive for tingling.  Endo/Heme/Allergies: Negative.   Psychiatric/Behavioral: Negative.        Physical Exam: Vital signs are temperature of 98.0.  Pulse 70.  Blood pressure 127/85.  Weight is 289 pounds.     Wt Readings from Last 3 Encounters:  06/02/23 289 lb 1.6 oz (131.1 kg)  05/05/23 289 lb (131.1 kg)  04/06/23 284 lb (128.8 kg)    Physical Exam Vitals reviewed.  HENT:     Head: Normocephalic and atraumatic.  Eyes:     Pupils: Pupils are equal, round, and reactive to light.  Cardiovascular:     Rate and Rhythm:  Normal rate and regular rhythm.     Heart sounds: Normal heart sounds.  Pulmonary:     Effort: Pulmonary effort is normal.     Breath sounds: Normal breath sounds.  Abdominal:     General: Bowel sounds are normal.     Palpations: Abdomen is soft.  Musculoskeletal:        General: No tenderness or deformity. Normal range of motion.     Cervical back: Normal range of motion.  Lymphadenopathy:     Cervical: No cervical adenopathy.  Skin:    General: Skin is warm and dry.     Findings: No erythema or rash.  Neurological:     Mental Status: He is alert and oriented to person, place, and time.  Psychiatric:        Behavior: Behavior normal.        Thought Content: Thought content normal.        Judgment: Judgment normal.     Lab Results  Component Value Date   WBC 7.7 06/02/2023   HGB 14.5 06/02/2023   HCT 41.9 06/02/2023   MCV 92.5 06/02/2023   PLT 162 06/02/2023   No results found for: "FERRITIN", "IRON", "TIBC", "UIBC", "IRONPCTSAT" Lab Results  Component Value Date   RBC 4.53 06/02/2023   No results found for: "KPAFRELGTCHN", "LAMBDASER", "KAPLAMBRATIO" No results found for: "IGGSERUM", "IGA", "IGMSERUM" No results found for: "TOTALPROTELP", "ALBUMINELP", "A1GS", "A2GS", "BETS", "BETA2SER", "GAMS", "MSPIKE", "SPEI"   Chemistry      Component Value Date/Time   NA 139 06/02/2023 0805   K 3.3 (L) 06/02/2023 0805   CL 103 06/02/2023 0805   CO2 28 06/02/2023 0805   BUN 13 06/02/2023 0805   CREATININE 1.15 06/02/2023 0805      Component Value Date/Time   CALCIUM 8.8 (L) 06/02/2023 0805   ALKPHOS 74 06/02/2023 0805   AST 15 06/02/2023 0805   ALT 15 06/02/2023 0805   BILITOT 0.3 06/02/2023 0805       Impression and Plan: Mr. 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 He completed his chemotherapy along with immunotherapy.   He now is on maintenance therapy with nivolumab and tolerating  well with a nice response.   We will set him up with a PET scan in about 2-3 weeks.  His last PET scan was back in August.  I am so happy that his quality of life is doing so well.  He is working.  He pretty much is doing what he likes to do and is a blessing.  We will plan to get him  back in 1 month.  .   10/17/20248:48 AM

## 2023-06-02 NOTE — Patient Instructions (Signed)

## 2023-06-03 LAB — T4: T4, Total: 6 ug/dL (ref 4.5–12.0)

## 2023-06-13 ENCOUNTER — Telehealth: Payer: Self-pay

## 2023-06-13 NOTE — Telephone Encounter (Signed)
Dr. Myna Hidalgo completed the P2P for patient PET Scan and authorization number is [id number]

## 2023-06-17 ENCOUNTER — Encounter (HOSPITAL_COMMUNITY)
Admission: RE | Admit: 2023-06-17 | Discharge: 2023-06-17 | Disposition: A | Discharge status: Home / Self Care | Payer: 59 | Source: Ambulatory Visit | Attending: Hematology & Oncology | Admitting: Hematology & Oncology

## 2023-06-17 DIAGNOSIS — C3491 Malignant neoplasm of unspecified part of right bronchus or lung: Secondary | ICD-10-CM | POA: Diagnosis present

## 2023-06-17 LAB — GLUCOSE, CAPILLARY: Glucose-Capillary: 91 mg/dL (ref 70–99)

## 2023-06-17 MED ORDER — FLUDEOXYGLUCOSE F - 18 (FDG) INJECTION
14.3200 | Freq: Once | INTRAVENOUS | Status: AC
  Administered 2023-06-17: 14.32 via INTRAVENOUS

## 2023-06-20 ENCOUNTER — Other Ambulatory Visit: Payer: Self-pay | Admitting: Hematology & Oncology

## 2023-06-30 ENCOUNTER — Inpatient Hospital Stay: Payer: No Typology Code available for payment source | Attending: Internal Medicine

## 2023-06-30 ENCOUNTER — Encounter: Payer: Self-pay | Admitting: Hematology & Oncology

## 2023-06-30 ENCOUNTER — Inpatient Hospital Stay (HOSPITAL_BASED_OUTPATIENT_CLINIC_OR_DEPARTMENT_OTHER): Payer: No Typology Code available for payment source | Admitting: Hematology & Oncology

## 2023-06-30 ENCOUNTER — Inpatient Hospital Stay: Payer: No Typology Code available for payment source

## 2023-06-30 VITALS — BP 150/92 | HR 68 | Resp 18

## 2023-06-30 VITALS — BP 134/101 | HR 69 | Temp 97.7°F | Resp 20 | Ht 71.0 in | Wt 288.0 lb

## 2023-06-30 DIAGNOSIS — C7931 Secondary malignant neoplasm of brain: Secondary | ICD-10-CM | POA: Diagnosis not present

## 2023-06-30 DIAGNOSIS — C3411 Malignant neoplasm of upper lobe, right bronchus or lung: Secondary | ICD-10-CM | POA: Insufficient documentation

## 2023-06-30 DIAGNOSIS — C3491 Malignant neoplasm of unspecified part of right bronchus or lung: Secondary | ICD-10-CM

## 2023-06-30 DIAGNOSIS — Z79620 Long term (current) use of immunosuppressive biologic: Secondary | ICD-10-CM | POA: Insufficient documentation

## 2023-06-30 DIAGNOSIS — C7970 Secondary malignant neoplasm of unspecified adrenal gland: Secondary | ICD-10-CM | POA: Insufficient documentation

## 2023-06-30 DIAGNOSIS — Z5112 Encounter for antineoplastic immunotherapy: Secondary | ICD-10-CM | POA: Insufficient documentation

## 2023-06-30 LAB — CMP (CANCER CENTER ONLY)
ALT: 18 U/L (ref 0–44)
AST: 15 U/L (ref 15–41)
Albumin: 4.6 g/dL (ref 3.5–5.0)
Alkaline Phosphatase: 76 U/L (ref 38–126)
Anion gap: 7 (ref 5–15)
BUN: 14 mg/dL (ref 6–20)
CO2: 29 mmol/L (ref 22–32)
Calcium: 8.8 mg/dL — ABNORMAL LOW (ref 8.9–10.3)
Chloride: 102 mmol/L (ref 98–111)
Creatinine: 1.18 mg/dL (ref 0.61–1.24)
GFR, Estimated: >60 mL/min (ref >60)
Glucose, Bld: 123 mg/dL — ABNORMAL HIGH (ref 70–99)
Potassium: 3.2 mmol/L — ABNORMAL LOW (ref 3.5–5.1)
Sodium: 138 mmol/L (ref 135–145)
Total Bilirubin: 0.4 mg/dL (ref <1.2)
Total Protein: 7.5 g/dL (ref 6.5–8.1)

## 2023-06-30 LAB — CBC WITH DIFFERENTIAL (CANCER CENTER ONLY)
Abs Immature Granulocytes: 0.04 10*3/uL (ref 0.00–0.07)
Basophils Absolute: 0.1 10*3/uL (ref 0.0–0.1)
Basophils Relative: 1 %
Eosinophils Absolute: 0.1 10*3/uL (ref 0.0–0.5)
Eosinophils Relative: 2 %
HCT: 42.5 % (ref 39.0–52.0)
Hemoglobin: 14.9 g/dL (ref 13.0–17.0)
Immature Granulocytes: 1 %
Lymphocytes Relative: 15 %
Lymphs Abs: 1.2 10*3/uL (ref 0.7–4.0)
MCH: 32.3 pg (ref 26.0–34.0)
MCHC: 35.1 g/dL (ref 30.0–36.0)
MCV: 92 fL (ref 80.0–100.0)
Monocytes Absolute: 0.5 10*3/uL (ref 0.1–1.0)
Monocytes Relative: 7 %
Neutro Abs: 6.1 10*3/uL (ref 1.7–7.7)
Neutrophils Relative %: 74 %
Platelet Count: 161 10*3/uL (ref 150–400)
RBC: 4.62 MIL/uL (ref 4.22–5.81)
RDW: 13.9 % (ref 11.5–15.5)
WBC Count: 8 10*3/uL (ref 4.0–10.5)
nRBC: 0 % (ref 0.0–0.2)

## 2023-06-30 LAB — TSH: TSH: 1.796 u[IU]/mL (ref 0.350–4.500)

## 2023-06-30 MED ORDER — SODIUM CHLORIDE 0.9% FLUSH
10.0000 mL | INTRAVENOUS | Status: DC | PRN
Start: 2023-06-30 — End: 2023-06-30
  Administered 2023-06-30: 10 mL

## 2023-06-30 MED ORDER — HEPARIN SOD (PORK) LOCK FLUSH 100 UNIT/ML IV SOLN
500.0000 [IU] | Freq: Once | INTRAVENOUS | Status: AC | PRN
  Administered 2023-06-30: 500 [IU]

## 2023-06-30 MED ORDER — SODIUM CHLORIDE 0.9 % IV SOLN
480.0000 mg | Freq: Once | INTRAVENOUS | Status: AC
  Administered 2023-06-30: 480 mg via INTRAVENOUS
  Filled 2023-06-30: qty 48

## 2023-06-30 MED ORDER — SODIUM CHLORIDE 0.9 % IV SOLN: Freq: Once | INTRAVENOUS | Status: AC

## 2023-06-30 NOTE — Progress Notes (Signed)
BP remains elevated, 134/101, instructed to monitor at home and notify PCP if it remains over 140/90. Dr. Myna Hidalgo aware.

## 2023-06-30 NOTE — Patient Instructions (Signed)

## 2023-06-30 NOTE — Patient Instructions (Signed)
 [facility] - A DEPT OF [facility]  Discharge Instructions: Thank you for choosing [facility] to provide your oncology and hematology care.   If you have a lab appointment with the Cancer Center, please go directly to the Cancer Center and check in at the registration area.  Wear comfortable clothing and clothing appropriate for easy access to any Portacath or PICC line.   We strive to give you quality time with your provider. You may need to reschedule your appointment if you arrive late (15 or more minutes).  Arriving late affects you and other patients whose appointments are after yours.  Also, if you miss three or more appointments without notifying the office, you may be dismissed from the clinic at the provider's discretion.      For prescription refill requests, have your pharmacy contact our office and allow 72 hours for refills to be completed.    Today you received the following chemotherapy and/or immunotherapy agents Opdivo      To help prevent nausea and vomiting after your treatment, we encourage you to take your nausea medication as directed.  BELOW ARE SYMPTOMS THAT SHOULD BE REPORTED IMMEDIATELY: *FEVER GREATER THAN 100.4 F (38 C) OR HIGHER *CHILLS OR SWEATING *NAUSEA AND VOMITING THAT IS NOT CONTROLLED WITH YOUR NAUSEA MEDICATION *UNUSUAL SHORTNESS OF BREATH *UNUSUAL BRUISING OR BLEEDING *URINARY PROBLEMS (pain or burning when urinating, or frequent urination) *BOWEL PROBLEMS (unusual diarrhea, constipation, pain near the anus) TENDERNESS IN MOUTH AND THROAT WITH OR WITHOUT PRESENCE OF ULCERS (sore throat, sores in mouth, or a toothache) UNUSUAL RASH, SWELLING OR PAIN  UNUSUAL VAGINAL DISCHARGE OR ITCHING   Items with * indicate a potential emergency and should be followed up as soon as possible or go to the Emergency Department if any problems should occur.  Please show the CHEMOTHERAPY ALERT CARD or IMMUNOTHERAPY  ALERT CARD at check-in to the Emergency Department and triage nurse. Should you have questions after your visit or need to cancel or reschedule your appointment, please contact [facility] - A DEPT OF [facility]  [phone] and follow the prompts.  Office hours are 8:00 a.m. to 4:30 p.m. Monday - Friday. Please note that voicemails left after 4:00 p.m. may not be returned until the following business day.  We are closed weekends and major holidays. You have access to a nurse at all times for urgent questions. Please call the main number to the clinic [phone] and follow the prompts.  For any non-urgent questions, you may also contact your provider using MyChart. We now offer e-Visits for anyone 77 and older to request care online for non-urgent symptoms. For details visit [email or web address].   Also download the MyChart app! Go to the app store, search "MyChart", open the app, select [place], and log in with your MyChart username and password.

## 2023-06-30 NOTE — Progress Notes (Signed)
Ok to treat patient today with BP 150/92 per Dr Myna Hidalgo.  Dr Myna Hidalgo aware of BPs today

## 2023-06-30 NOTE — Progress Notes (Signed)
Hematology and Oncology Follow Up Visit  Kevin Hunter [id number] 08/18/1965 57 y.o. 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  Kevin Hunter looks fantastic.  Kevin Hunter feels good.  Kevin Hunter really has had no complaints.  It sounds like Kevin Hunter has had a nice Thanksgiving.  Kevin Hunter will be in a cousin's house.  Kevin Hunter is still working.  Kevin Hunter is quite busy at work.  Kevin Hunter had a PET scan done recently.  Unfortunately, this has not yet been read.  Kevin Hunter has had no fever.  Kevin Hunter has had no bleeding.  Kevin Hunter has had no change in bowel or bladder habits.  His last TSH was normal at 1.5.  Overall, I would say that his performance status is probably ECOG 0.  .  Medications:  Allergies as of 06/30/2023       Reactions   Paclitaxel Anaphylaxis   Penicillins Other (See Comments)   Unknown Reaction when Kevin Hunter was an infant.        Medication List        Accurate as of June 30, 2023  8:31 AM. If you have any questions, ask your nurse or doctor.          amLODipine 5 MG tablet Commonly known as: NORVASC Take 5 mg by mouth at bedtime.   clonazePAM 1 MG tablet Commonly known as: KLONOPIN Take 1 mg by mouth 2 (two) times daily as needed for anxiety (May take additional 0.5mg  as needed for panic attack).   cyclobenzaprine 10 MG tablet Commonly known as: FLEXERIL Take 1 tablet (10 mg total) by mouth 3 (three) times daily as needed for muscle spasms.   pantoprazole 40 MG tablet Commonly known as: PROTONIX TAKE 1 TABLET (40 MG TOTAL) BY MOUTH  TWICE A DAY BEFORE MEALS   pseudoephedrine 30 MG tablet Commonly known as: SUDAFED Take 60 mg by mouth every 4 (four) hours as needed (headache).   psyllium 58.6 % packet Commonly known as: METAMUCIL Take 1 packet by mouth daily.   QUEtiapine 400 MG 24 hr tablet Commonly known as: SEROQUEL XR Take 400 mg by mouth at bedtime.   sildenafil 20 MG tablet Commonly known as: REVATIO 2-5 tablets Orally daily as needed for 90 days   solifenacin 10 MG tablet Commonly known as: VESICARE Take 10 mg by mouth daily.   traZODone 100 MG tablet Commonly known as: DESYREL Take 300 mg by mouth at bedtime.   venlafaxine 75 MG tablet Commonly known as: EFFEXOR Take 150 mg by mouth at bedtime.        Allergies:  Allergies  Allergen Reactions   Paclitaxel Anaphylaxis   Penicillins Other (See Comments)    Unknown Reaction when Kevin Hunter was an infant.    Past Medical History, Surgical history, Social history, and Family History were reviewed and updated.  Review of Systems: Review of Systems  Constitutional: Negative.   HENT: Negative.    Eyes: Negative.   Respiratory: Negative.    Cardiovascular: Negative.   Gastrointestinal:  Negative.   Genitourinary: Negative.   Musculoskeletal: Negative.   Skin: Negative.   Neurological:  Positive for tingling.  Endo/Heme/Allergies: Negative.   Psychiatric/Behavioral: Negative.        Physical Exam: Vital signs are temperature of 98.0.  Pulse 70.  Blood pressure 127/85.  Weight is 289 pounds.     Wt Readings from Last 3 Encounters:  06/30/23 288 lb (130.6 kg)  06/02/23 289 lb 1.6 oz (131.1 kg)  05/05/23 289 lb (131.1 kg)    Physical Exam Vitals reviewed.  HENT:     Head: Normocephalic and atraumatic.  Eyes:     Pupils: Pupils are equal, round, and reactive to light.  Cardiovascular:     Rate and Rhythm: Normal rate and regular rhythm.     Heart sounds: Normal heart sounds.  Pulmonary:     Effort: Pulmonary effort is normal.      Breath sounds: Normal breath sounds.  Abdominal:     General: Bowel sounds are normal.     Palpations: Abdomen is soft.  Musculoskeletal:        General: No tenderness or deformity. Normal range of motion.     Cervical back: Normal range of motion.  Lymphadenopathy:     Cervical: No cervical adenopathy.  Skin:    General: Skin is warm and dry.     Findings: No erythema or rash.  Neurological:     Mental Status: Kevin Hunter is alert and oriented to person, place, and time.  Psychiatric:        Behavior: Behavior normal.        Thought Content: Thought content normal.        Judgment: Judgment normal.     Lab Results  Component Value Date   WBC 8.0 06/30/2023   HGB 14.9 06/30/2023   HCT 42.5 06/30/2023   MCV 92.0 06/30/2023   PLT 161 06/30/2023   No results found for: "FERRITIN", "IRON", "TIBC", "UIBC", "IRONPCTSAT" Lab Results  Component Value Date   RBC 4.62 06/30/2023   No results found for: "KPAFRELGTCHN", "LAMBDASER", "KAPLAMBRATIO" No results found for: "IGGSERUM", "IGA", "IGMSERUM" No results found for: "TOTALPROTELP", "ALBUMINELP", "A1GS", "A2GS", "BETS", "BETA2SER", "GAMS", "MSPIKE", "SPEI"   Chemistry      Component Value Date/Time   NA 139 06/02/2023 0805   K 3.3 (L) 06/02/2023 0805   CL 103 06/02/2023 0805   CO2 28 06/02/2023 0805   BUN 13 06/02/2023 0805   CREATININE 1.15 06/02/2023 0805      Component Value Date/Time   CALCIUM 8.8 (L) 06/02/2023 0805   ALKPHOS 74 06/02/2023 0805   AST 15 06/02/2023 0805   ALT 15 06/02/2023 0805   BILITOT 0.3 06/02/2023 0805       Impression and Plan: Kevin Hunter is a very pleasant 57 yo caucasian gentleman with 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 Kevin Hunter completed his chemotherapy along with immunotherapy.   Kevin Hunter now is on maintenance therapy with nivolumab and tolerating well with a nice response.   We will see what his PET scan shows.  Again I would have to hope that the PET scan will be  okay.  We will plan to get back in 1 month. .   11/14/20248:31 AM

## 2023-07-29 ENCOUNTER — Encounter: Payer: Self-pay | Admitting: Hematology & Oncology

## 2023-07-29 ENCOUNTER — Other Ambulatory Visit: Payer: Self-pay

## 2023-07-29 ENCOUNTER — Inpatient Hospital Stay: Payer: 59

## 2023-07-29 ENCOUNTER — Inpatient Hospital Stay: Payer: 59 | Attending: Internal Medicine

## 2023-07-29 ENCOUNTER — Inpatient Hospital Stay (HOSPITAL_BASED_OUTPATIENT_CLINIC_OR_DEPARTMENT_OTHER): Payer: 59 | Admitting: Hematology & Oncology

## 2023-07-29 ENCOUNTER — Emergency Department (HOSPITAL_BASED_OUTPATIENT_CLINIC_OR_DEPARTMENT_OTHER): Payer: 59

## 2023-07-29 ENCOUNTER — Encounter (HOSPITAL_BASED_OUTPATIENT_CLINIC_OR_DEPARTMENT_OTHER): Payer: Self-pay

## 2023-07-29 ENCOUNTER — Emergency Department (HOSPITAL_BASED_OUTPATIENT_CLINIC_OR_DEPARTMENT_OTHER)
Admission: EM | Admit: 2023-07-29 | Discharge: 2023-07-29 | Disposition: A | Discharge status: Home / Self Care | Payer: 59 | Attending: Emergency Medicine | Admitting: Emergency Medicine

## 2023-07-29 VITALS — BP 79/48 | HR 162 | Temp 97.6°F | Resp 19 | Ht 71.0 in | Wt 284.0 lb

## 2023-07-29 DIAGNOSIS — Z85841 Personal history of malignant neoplasm of brain: Secondary | ICD-10-CM | POA: Diagnosis not present

## 2023-07-29 DIAGNOSIS — Z85118 Personal history of other malignant neoplasm of bronchus and lung: Secondary | ICD-10-CM | POA: Insufficient documentation

## 2023-07-29 DIAGNOSIS — Z79620 Long term (current) use of immunosuppressive biologic: Secondary | ICD-10-CM | POA: Insufficient documentation

## 2023-07-29 DIAGNOSIS — N179 Acute kidney failure, unspecified: Secondary | ICD-10-CM | POA: Diagnosis not present

## 2023-07-29 DIAGNOSIS — I471 Supraventricular tachycardia, unspecified: Secondary | ICD-10-CM | POA: Diagnosis not present

## 2023-07-29 DIAGNOSIS — I959 Hypotension, unspecified: Secondary | ICD-10-CM | POA: Insufficient documentation

## 2023-07-29 DIAGNOSIS — C3491 Malignant neoplasm of unspecified part of right bronchus or lung: Secondary | ICD-10-CM

## 2023-07-29 DIAGNOSIS — Z5112 Encounter for antineoplastic immunotherapy: Secondary | ICD-10-CM | POA: Insufficient documentation

## 2023-07-29 DIAGNOSIS — C7931 Secondary malignant neoplasm of brain: Secondary | ICD-10-CM | POA: Insufficient documentation

## 2023-07-29 DIAGNOSIS — R002 Palpitations: Secondary | ICD-10-CM | POA: Diagnosis present

## 2023-07-29 DIAGNOSIS — C3411 Malignant neoplasm of upper lobe, right bronchus or lung: Secondary | ICD-10-CM | POA: Insufficient documentation

## 2023-07-29 LAB — CBC WITH DIFFERENTIAL/PLATELET
Abs Immature Granulocytes: 0.1 10*3/uL — ABNORMAL HIGH (ref 0.00–0.07)
Basophils Absolute: 0.1 10*3/uL (ref 0.0–0.1)
Basophils Relative: 1 %
Eosinophils Absolute: 0.1 10*3/uL (ref 0.0–0.5)
Eosinophils Relative: 1 %
HCT: 48.4 % (ref 39.0–52.0)
Hemoglobin: 16.9 g/dL (ref 13.0–17.0)
Immature Granulocytes: 1 %
Lymphocytes Relative: 11 %
Lymphs Abs: 1.7 10*3/uL (ref 0.7–4.0)
MCH: 31.8 pg (ref 26.0–34.0)
MCHC: 34.9 g/dL (ref 30.0–36.0)
MCV: 91.1 fL (ref 80.0–100.0)
Monocytes Absolute: 1.4 10*3/uL — ABNORMAL HIGH (ref 0.1–1.0)
Monocytes Relative: 9 %
Neutro Abs: 11.6 10*3/uL — ABNORMAL HIGH (ref 1.7–7.7)
Neutrophils Relative %: 77 %
Platelets: 278 10*3/uL (ref 150–400)
RBC: 5.31 MIL/uL (ref 4.22–5.81)
RDW: 14.4 % (ref 11.5–15.5)
WBC: 14.9 10*3/uL — ABNORMAL HIGH (ref 4.0–10.5)
nRBC: 0 % (ref 0.0–0.2)

## 2023-07-29 LAB — BASIC METABOLIC PANEL
Anion gap: 11 (ref 5–15)
BUN: 13 mg/dL (ref 6–20)
CO2: 23 mmol/L (ref 22–32)
Calcium: 9.2 mg/dL (ref 8.9–10.3)
Chloride: 105 mmol/L (ref 98–111)
Creatinine, Ser: 2 mg/dL — ABNORMAL HIGH (ref 0.61–1.24)
GFR, Estimated: 38 mL/min — ABNORMAL LOW (ref >60)
Glucose, Bld: 133 mg/dL — ABNORMAL HIGH (ref 70–99)
Potassium: 3.4 mmol/L — ABNORMAL LOW (ref 3.5–5.1)
Sodium: 139 mmol/L (ref 135–145)

## 2023-07-29 LAB — CBC WITH DIFFERENTIAL (CANCER CENTER ONLY)
Abs Immature Granulocytes: 0.07 10*3/uL (ref 0.00–0.07)
Basophils Absolute: 0.1 10*3/uL (ref 0.0–0.1)
Basophils Relative: 1 %
Eosinophils Absolute: 0.1 10*3/uL (ref 0.0–0.5)
Eosinophils Relative: 1 %
HCT: 48.4 % (ref 39.0–52.0)
Hemoglobin: 17 g/dL (ref 13.0–17.0)
Immature Granulocytes: 1 %
Lymphocytes Relative: 11 %
Lymphs Abs: 1.5 10*3/uL (ref 0.7–4.0)
MCH: 32 pg (ref 26.0–34.0)
MCHC: 35.1 g/dL (ref 30.0–36.0)
MCV: 91.1 fL (ref 80.0–100.0)
Monocytes Absolute: 1.1 10*3/uL — ABNORMAL HIGH (ref 0.1–1.0)
Monocytes Relative: 8 %
Neutro Abs: 10.7 10*3/uL — ABNORMAL HIGH (ref 1.7–7.7)
Neutrophils Relative %: 78 %
Platelet Count: 264 10*3/uL (ref 150–400)
RBC: 5.31 MIL/uL (ref 4.22–5.81)
RDW: 14 % (ref 11.5–15.5)
WBC Count: 13.5 10*3/uL — ABNORMAL HIGH (ref 4.0–10.5)
nRBC: 0 % (ref 0.0–0.2)

## 2023-07-29 LAB — CMP (CANCER CENTER ONLY)
ALT: 22 U/L (ref 0–44)
AST: 18 U/L (ref 15–41)
Albumin: 4.5 g/dL (ref 3.5–5.0)
Alkaline Phosphatase: 84 U/L (ref 38–126)
Anion gap: 13 (ref 5–15)
BUN: 12 mg/dL (ref 6–20)
CO2: 26 mmol/L (ref 22–32)
Calcium: 9.4 mg/dL (ref 8.9–10.3)
Chloride: 102 mmol/L (ref 98–111)
Creatinine: 1.65 mg/dL — ABNORMAL HIGH (ref 0.61–1.24)
GFR, Estimated: 48 mL/min — ABNORMAL LOW (ref >60)
Glucose, Bld: 153 mg/dL — ABNORMAL HIGH (ref 70–99)
Potassium: 3.3 mmol/L — ABNORMAL LOW (ref 3.5–5.1)
Sodium: 141 mmol/L (ref 135–145)
Total Bilirubin: 0.7 mg/dL (ref <1.2)
Total Protein: 7.9 g/dL (ref 6.5–8.1)

## 2023-07-29 LAB — MAGNESIUM: Magnesium: 2.2 mg/dL (ref 1.7–2.4)

## 2023-07-29 LAB — TSH: TSH: 4.59 u[IU]/mL — ABNORMAL HIGH (ref 0.350–4.500)

## 2023-07-29 LAB — LACTATE DEHYDROGENASE: LDH: 205 U/L — ABNORMAL HIGH (ref 98–192)

## 2023-07-29 MED ORDER — HEPARIN SOD (PORK) LOCK FLUSH 100 UNIT/ML IV SOLN
500.0000 [IU] | Freq: Once | INTRAVENOUS | Status: AC
  Administered 2023-07-29: 500 [IU]
  Filled 2023-07-29: qty 5

## 2023-07-29 MED ORDER — SODIUM CHLORIDE 0.9 % IV BOLUS
1000.0000 mL | Freq: Once | INTRAVENOUS | Status: AC
Start: 2023-07-29 — End: 2023-07-29
  Administered 2023-07-29: 1000 mL via INTRAVENOUS

## 2023-07-29 MED ORDER — SODIUM CHLORIDE 0.9 % IV BOLUS
1000.0000 mL | Freq: Once | INTRAVENOUS | Status: AC
  Administered 2023-07-29: 1000 mL via INTRAVENOUS

## 2023-07-29 MED ORDER — POTASSIUM CHLORIDE CRYS ER 20 MEQ PO TBCR
40.0000 meq | EXTENDED_RELEASE_TABLET | Freq: Once | ORAL | Status: AC
  Administered 2023-07-29: 40 meq via ORAL
  Filled 2023-07-29: qty 2

## 2023-07-29 NOTE — ED Notes (Signed)
Pt sent from upstairs from oncology office presenting w tachycardia and hypotension. Triage nurse and charge nurse used vagal maneuvers such as blowing and trendelenburg position. Pt HR went from 150 to 100, EDP at bedside

## 2023-07-29 NOTE — ED Triage Notes (Signed)
The patient was at chemo and they stated his heart rate was elevated and he was tachycardic.

## 2023-07-29 NOTE — Progress Notes (Signed)
Hematology and Oncology Follow Up Visit  Kevin Hunter [id number] [birth year] 57 y.o. 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  Unfortunately, I think we have a problem.  He really is not looking good.  He is markedly tachycardic.  He is hypotensive.  He said he woke up this way.  He said he worked 24 hours yesterday.  A week ago he is feeling well.  He has had no fever.  However, he says he does feel warm.  He has had no diaphoresis.  He has had no nausea or vomiting.  There has been no arm pain.  I am not sure what is going on.  However, I just do not like how he  looks.  He has had no rashes.  There has been no bleeding.  He has had no diarrhea.  He has had no leg swelling.  We do watch his thyroid.  So far, there is been no thyroid issues.  He has had no change in medications.  Currently, I would have to say that his performance status is probably ECOG 2.     Medications:  Allergies as of 07/29/2023       Reactions   Paclitaxel Anaphylaxis   Penicillins Other (See Comments)   Unknown Reaction when he was an infant.        Medication List        Accurate as of July 29, 2023  9:13 AM. If you have any questions, ask your nurse or doctor.          STOP taking these medications    pseudoephedrine 30 MG tablet Commonly known as: SUDAFED Stopped by: Josph Macho       TAKE these medications    amLODipine 5 MG tablet Commonly known as: NORVASC Take 5 mg by mouth at  bedtime.   clonazePAM 1 MG tablet Commonly known as: KLONOPIN Take 1 mg by mouth 2 (two) times daily as needed for anxiety (May take additional 0.5mg  as needed for panic attack).   cyclobenzaprine 10 MG tablet Commonly known as: FLEXERIL Take 1 tablet (10 mg total) by mouth 3 (three) times daily as needed for muscle spasms.   pantoprazole 40 MG tablet Commonly known as: PROTONIX TAKE 1 TABLET (40 MG TOTAL) BY MOUTH TWICE A DAY BEFORE MEALS   psyllium 58.6 % packet Commonly known as: METAMUCIL Take 1 packet by mouth daily.   QUEtiapine 400 MG 24 hr tablet Commonly known as: SEROQUEL XR Take 400 mg by mouth at bedtime.   sildenafil 20 MG tablet Commonly known as: REVATIO 2-5 tablets Orally daily as needed for 90 days   solifenacin 10 MG tablet Commonly known as: VESICARE Take 10 mg by mouth daily.   traZODone 100 MG tablet Commonly known as: DESYREL Take 300 mg by mouth at bedtime.   venlafaxine 75 MG tablet Commonly known as: EFFEXOR Take 150 mg by mouth at bedtime.        Allergies:  Allergies  Allergen Reactions   Paclitaxel Anaphylaxis   Penicillins Other (See Comments)    Unknown Reaction when he was an infant.    Past Medical History, Surgical history, Social history, and Family History were reviewed and updated.  Review of Systems: Review of Systems  Constitutional: Negative.   HENT: Negative.    Eyes: Negative.   Respiratory: Negative.    Cardiovascular: Negative.   Gastrointestinal: Negative.   Genitourinary: Negative.   Musculoskeletal: Negative.   Skin: Negative.   Neurological:  Positive for tingling.  Endo/Heme/Allergies: Negative.   Psychiatric/Behavioral: Negative.        Physical Exam: Vital signs are temperature of 97.6.  Pulse 162.  Blood pressure 79/48.  Weight is 284 pounds.     Wt Readings from Last 3 Encounters:  07/29/23 284 lb (128.8 kg)  06/30/23 288 lb (130.6 kg)  06/02/23 289 lb 1.6 oz (131.1 kg)    Physical  Exam Vitals reviewed.  HENT:     Head: Normocephalic and atraumatic.  Eyes:     Pupils: Pupils are equal, round, and reactive to light.  Cardiovascular:     Rate and Rhythm: Normal rate and regular rhythm.     Heart sounds: Normal heart sounds.     Comments: Cardiac exam is markedly tachycardic but regular.  He has no murmurs, rubs or bruits. Pulmonary:     Effort: Pulmonary effort is normal.     Breath sounds: Normal breath sounds.     Comments: His lungs sound clear bilaterally.  I do not hear any wheezing.  There is no rhonchi. Abdominal:     General: Bowel sounds are normal.     Palpations: Abdomen is soft.  Musculoskeletal:        General: No tenderness or deformity. Normal range of motion.     Cervical back: Normal range of motion.  Lymphadenopathy:     Cervical: No cervical adenopathy.  Skin:    General: Skin is warm and dry.     Findings: No erythema or rash.  Neurological:     Mental Status: He is alert and oriented to person, place, and time.  Psychiatric:        Behavior: Behavior normal.        Thought Content: Thought content normal.        Judgment: Judgment normal.     Lab Results  Component Value Date   WBC 13.5 (H) 07/29/2023   HGB 17.0 07/29/2023   HCT 48.4 07/29/2023   MCV 91.1 07/29/2023   PLT 264 07/29/2023   No results found for: "FERRITIN", "IRON", "TIBC", "UIBC", "IRONPCTSAT" Lab Results  Component Value Date   RBC 5.31 07/29/2023   No results found for: "KPAFRELGTCHN", "LAMBDASER", "KAPLAMBRATIO" No results found for: "IGGSERUM", "IGA", "IGMSERUM" No results found for: "TOTALPROTELP", "ALBUMINELP", "A1GS", "A2GS", "BETS", "BETA2SER", "GAMS", "MSPIKE", "SPEI"   Chemistry      Component Value Date/Time   NA 141 07/29/2023 0733   K 3.3 (L) 07/29/2023 0733   CL 102 07/29/2023 0733   CO2 26 07/29/2023 0733   BUN 12 07/29/2023 0733   CREATININE 1.65 (H) 07/29/2023 0733      Component Value Date/Time   CALCIUM 9.4 07/29/2023 0733    ALKPHOS 84 07/29/2023 0733   AST 18 07/29/2023 0733   ALT 22 07/29/2023 0733   BILITOT 0.7 07/29/2023 0733       Impression and Plan: Mr. 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 He completed his chemotherapy along with immunotherapy.   He now is on maintenance therapy with nivolumab and tolerating well with a nice response.   Again, I am not sure why he is hypotensive and tachycardic.  We did an EKG on him.  He has an SVT.  As such, we will get him down to the emergency room.   I am sure he will have to be admitted.  I do not think he has any kind of cardiac issues.  If he is in the hospital, I am sure that we will see him.  Again, we will hold on his Opdivo.  I will give him 2 weeks off on the Opdivo.    12/13/20249:13 AM

## 2023-07-29 NOTE — ED Notes (Signed)
Pt able to tolerate PO challenge

## 2023-07-29 NOTE — ED Notes (Signed)
Pt placed on 2L O2 

## 2023-07-29 NOTE — ED Provider Notes (Signed)
[place] [facility] Provider Note   CSN: [id number] Arrival date & time: 07/29/23  0915     History  Chief Complaint  Patient presents with   Palpitations    Kevin Hunter is a 57 y.o. male.  He has a history of lung cancer with mets to brain.  He was up in the cancer center today for treatment and when he checked and they found his heart rate to be elevated.  EKG was done showing SVT and the patient was transported to the ED for further evaluation.  Patient denies any chest pain or shortness of breath.  No diaphoresis nausea or vomiting.  He did say he felt a little bit lightheaded upstairs in the office when they were bringing him here but not before that.  He does endorse that he has worked for 24-hour shifts and only had 4 hours sleep, had more caffeinated beverages than usual.  He is felt like he might of had this rhythm in the past but he attributed it to not taking his anxiety medicine.  He has been taking his medications regularly and took his anxiety medicine this morning.  No history of any cardiac disease.  Not currently on steroids.  Feels he has been eating and drinking well and not dehydrated.  The history is provided by the patient.       Home Medications Prior to Admission medications   Medication Sig Start Date End Date Taking? Authorizing Provider  amLODipine (NORVASC) 5 MG tablet Take 5 mg by mouth at bedtime.    [provider]  clonazePAM (KLONOPIN) 1 MG tablet Take 1 mg by mouth 2 (two) times daily as needed for anxiety (May take additional 0.5mg  as needed for panic attack).    [provider]  cyclobenzaprine (FLEXERIL) 10 MG tablet Take 1 tablet (10 mg total) by mouth 3 (three) times daily as needed for muscle spasms. 06/29/22   Josph Macho, MD  pantoprazole (PROTONIX) 40 MG tablet TAKE 1 TABLET (40 MG TOTAL) BY MOUTH TWICE A DAY BEFORE MEALS 06/20/23   Covington, Sarah M, PA-C  psyllium (METAMUCIL)  58.6 % packet Take 1 packet by mouth daily.    [provider]  QUEtiapine (SEROQUEL XR) 400 MG 24 hr tablet Take 400 mg by mouth at bedtime. 04/23/21   [provider]  sildenafil (REVATIO) 20 MG tablet 2-5 tablets Orally daily as needed for 90 days 12/03/16   [provider]  solifenacin (VESICARE) 10 MG tablet Take 10 mg by mouth daily.    [provider]  traZODone (DESYREL) 100 MG tablet Take 300 mg by mouth at bedtime.    [provider]  venlafaxine (EFFEXOR) 75 MG tablet Take 150 mg by mouth at bedtime. 04/24/21   [provider]  prochlorperazine (COMPAZINE) 10 MG tablet Take 1 tablet (10 mg total) by mouth every 6 (six) hours as needed (Nausea or vomiting). 06/22/21 07/15/21  Josph Macho, MD      Allergies    Paclitaxel and Penicillins    Review of Systems   Review of Systems  Constitutional:  Negative for fever.  HENT:  Negative for sore throat.   Eyes:  Negative for visual disturbance.  Respiratory:  Negative for shortness of breath.   Cardiovascular:  Negative for chest pain.  Gastrointestinal:  Negative for abdominal pain.  Genitourinary:  Negative for dysuria.  Skin:  Negative for rash.  Neurological:  Positive for dizziness and  light-headedness.    Physical Exam Updated Vital Signs BP (!) 99/56   Pulse 99   Resp 13   Ht 5\' 11"  (1.803 m)   Wt 128 kg   SpO2 95%   BMI 39.36 kg/m  Physical Exam Vitals and nursing note reviewed.  Constitutional:      General: He is not in acute distress.    Appearance: Normal appearance. He is well-developed.  HENT:     Head: Normocephalic and atraumatic.  Eyes:     Conjunctiva/sclera: Conjunctivae normal.  Cardiovascular:     Rate and Rhythm: Normal rate and regular rhythm.     Heart sounds: No murmur heard. Pulmonary:     Effort: Pulmonary effort is normal. No respiratory distress.     Breath sounds: Normal breath sounds.     Comments: Port in right upper chest without  surrounding erythema Abdominal:     Palpations: Abdomen is soft.     Tenderness: There is no abdominal tenderness.  Musculoskeletal:        General: No swelling.     Cervical back: Neck supple.  Skin:    General: Skin is warm and dry.     Capillary Refill: Capillary refill takes less than 2 seconds.  Neurological:     General: No focal deficit present.     Mental Status: He is alert.     Sensory: No sensory deficit.     Motor: No weakness.     ED Results / Procedures / Treatments   Labs (all labs ordered are listed, but only abnormal results are displayed) Labs Reviewed  BASIC METABOLIC PANEL - Abnormal; Notable for the following components:      Result Value   Potassium 3.4 (*)    Glucose, Bld 133 (*)    Creatinine, Ser 2.00 (*)    GFR, Estimated 38 (*)    All other components within normal limits  CBC WITH DIFFERENTIAL/PLATELET - Abnormal; Notable for the following components:   WBC 14.9 (*)    Neutro Abs 11.6 (*)    Monocytes Absolute 1.4 (*)    Abs Immature Granulocytes 0.10 (*)    All other components within normal limits  MAGNESIUM    EKG EKG Interpretation Date/Time:  Friday July 29 2023 09:23:56 EST Ventricular Rate:  98 PR Interval:  162 QRS Duration:  113 QT Interval:  358 QTC Calculation: 458 R Axis:   144  Text Interpretation: Sinus rhythm Borderline intraventricular conduction delay Low voltage, precordial leads SVT broke after vagal Confirmed by Meridee Score [phone]) on 07/29/2023 9:39:17 AM  Radiology DG Chest Port 1 View Result Date: 07/29/2023 CLINICAL DATA:  57 year old male with metastatic lung cancer. Supraventricular tachycardia. EXAM: PORTABLE CHEST 1 VIEW COMPARISON:  PET-CT 06/17/2023 and earlier. FINDINGS: Portable AP semi upright view at 0938 hours. Right chest power port redemonstrated. Visualized tracheal air column is within normal limits. Stable cardiac size and mediastinal contours. Right upper lobe pulmonary nodule better  demonstrated by CT. No pneumothorax, pulmonary edema, pleural effusion or consolidation. Negative visible bowel gas and osseous structures. IMPRESSION: Known right upper lobe pulmonary nodule better demonstrated by PET-CT. No new cardiopulmonary abnormality. Electronically Signed   By: Odessa Fleming M.D.   On: 07/29/2023 09:59    Procedures Procedures    Medications Ordered in ED Medications  sodium chloride 0.9 % bolus 1,000 mL (0 mLs Intravenous Stopped 07/29/23 1118)  potassium chloride SA (KLOR-CON M) CR tablet 40 mEq (40 mEq Oral Given 07/29/23 1005)  sodium chloride  0.9 % bolus 1,000 mL (0 mLs Intravenous Stopped 07/29/23 1309)  heparin lock flush 100 unit/mL (500 Units Intracatheter Given 07/29/23 1406)    ED Course/ Medical Decision Making/ A&P Clinical Course as of 07/29/23 1721  Fri Jul 29, 2023  0934 ECG from a pain clinic showing likely SVT at 153 without significant ischemic findings.  On arrival here patient was instructed to do vagal maneuver and broke into sinus tachycardia.  EKG here now showing normal sinus rhythm [MB]  1000 Chest x-ray interpreted by me as no acute infiltrate.  Labs coming back with elevated white count, elevated creatinine and low potassium.  New AKI.  Will give patient IV fluids and some oral potassium. [MB]  1401 Patient feeling back to baseline and his blood pressure is improved heart rate is improved.  Sats have been running 90 to 93% on room air.  He will closely follow-up with his oncology team for further evaluation.  Return instructions discussed [MB]    Clinical Course User Index [MB] Terrilee Files, MD                                 Medical Decision Making Amount and/or Complexity of Data Reviewed Labs: ordered. Radiology: ordered.  Risk Prescription drug management.   This patient complains of weakness lightheadedness; this involves an extensive number of treatment Options and is a complaint that carries with it a high risk of  complications and morbidity. The differential includes arrhythmia, dehydration, metabolic derangement, ACS, infection  I ordered, reviewed and interpreted labs, which included CBC with mildly elevated white count, chemistries with new AKI low potassium I ordered medication IV fluids oral potassium and reviewed PMP when indicated. I ordered imaging studies which included chest x-ray and I independently    visualized and interpreted imaging which showed no acute findings Previous records obtained and reviewed in epic including recent oncology notes Cardiac monitoring reviewed, normal sinus rhythm Social determinants considered, no significant barriers Critical Interventions: None  After the interventions stated above, I reevaluated the patient and found patient to be resting comfortably in no distress.  Not symptomatic with orthostatic vital signs Admission and further testing considered, no indications for admission or further workup at this time.  Contacted his oncologist Dr. Myna Hidalgo and he will arrange to have patient get repeat lab work done.  Return instructions discussed.         Final Clinical Impression(s) / ED Diagnoses Final diagnoses:  SVT (supraventricular tachycardia) (HCC)  AKI (acute kidney injury) (HCC)    Rx / DC Orders ED Discharge Orders     None         Terrilee Files, MD 07/29/23 1723

## 2023-07-29 NOTE — ED Notes (Signed)
Pt bumped to 3 L O2 nasal cannula, O2 sat dropped to 91% prior to. RRT notified

## 2023-07-29 NOTE — ED Notes (Signed)
Pt came to ED from oncology unit upstairs, port accessed prior to arrival to ED

## 2023-07-29 NOTE — ED Notes (Signed)
PO challenge started per EDP order

## 2023-07-29 NOTE — ED Notes (Signed)
Attempted Valsalva maneuvers with success.  Pt converted to SR/ST at 98-102

## 2023-07-29 NOTE — ED Notes (Signed)
After vagal maneuvers his heart rate went from 154 to 99

## 2023-07-29 NOTE — ED Notes (Addendum)
Received TC from Dr. Delorise Jackson office PTA regarding patient.  Pt is in SVT with BP 70's, pale and diaphoretic.  Pt presents with same to ED, noted HR 150's, pt pale and slightly diaphoretic.  BP 90's and pox 89% on RA.  Pt placed on O2 at 2L per [place].  Dr. Charm Barges notified of patients arrival.

## 2023-07-29 NOTE — Discharge Instructions (Addendum)
You were seen in the emergency department for elevated heart rate.  Your symptoms improved after a vagal maneuver.  Your lab work showed your kidney function to be worsened and you were given IV fluids.  Please continue to stay well-hydrated and follow-up with your oncology team for repeat lab work.  Return if any worsening or concerning symptoms.

## 2023-07-29 NOTE — Patient Instructions (Signed)

## 2023-07-30 LAB — T4: T4, Total: 7.5 ug/dL (ref 4.5–12.0)

## 2023-08-05 ENCOUNTER — Inpatient Hospital Stay: Payer: 59

## 2023-08-05 ENCOUNTER — Inpatient Hospital Stay: Payer: 59 | Admitting: Family

## 2023-08-05 VITALS — BP 135/69 | HR 82 | Temp 97.8°F | Resp 16 | Ht 71.0 in | Wt 290.0 lb

## 2023-08-05 VITALS — BP 135/82 | HR 64 | Temp 97.7°F | Resp 18

## 2023-08-05 DIAGNOSIS — Z79620 Long term (current) use of immunosuppressive biologic: Secondary | ICD-10-CM | POA: Diagnosis not present

## 2023-08-05 DIAGNOSIS — C3411 Malignant neoplasm of upper lobe, right bronchus or lung: Secondary | ICD-10-CM | POA: Insufficient documentation

## 2023-08-05 DIAGNOSIS — I959 Hypotension, unspecified: Secondary | ICD-10-CM | POA: Insufficient documentation

## 2023-08-05 DIAGNOSIS — C3490 Malignant neoplasm of unspecified part of unspecified bronchus or lung: Secondary | ICD-10-CM | POA: Diagnosis not present

## 2023-08-05 DIAGNOSIS — C7931 Secondary malignant neoplasm of brain: Secondary | ICD-10-CM | POA: Insufficient documentation

## 2023-08-05 DIAGNOSIS — C3491 Malignant neoplasm of unspecified part of right bronchus or lung: Secondary | ICD-10-CM

## 2023-08-05 DIAGNOSIS — E032 Hypothyroidism due to medicaments and other exogenous substances: Secondary | ICD-10-CM | POA: Diagnosis not present

## 2023-08-05 DIAGNOSIS — I471 Supraventricular tachycardia, unspecified: Secondary | ICD-10-CM | POA: Insufficient documentation

## 2023-08-05 DIAGNOSIS — Z5112 Encounter for antineoplastic immunotherapy: Secondary | ICD-10-CM | POA: Insufficient documentation

## 2023-08-05 LAB — CMP (CANCER CENTER ONLY)
ALT: 18 U/L (ref 0–44)
AST: 15 U/L (ref 15–41)
Albumin: 4.2 g/dL (ref 3.5–5.0)
Alkaline Phosphatase: 81 U/L (ref 38–126)
Anion gap: 9 (ref 5–15)
BUN: 15 mg/dL (ref 6–20)
CO2: 28 mmol/L (ref 22–32)
Calcium: 8.7 mg/dL — ABNORMAL LOW (ref 8.9–10.3)
Chloride: 102 mmol/L (ref 98–111)
Creatinine: 1.13 mg/dL (ref 0.61–1.24)
GFR, Estimated: >60 mL/min (ref >60)
Glucose, Bld: 130 mg/dL — ABNORMAL HIGH (ref 70–99)
Potassium: 3.2 mmol/L — ABNORMAL LOW (ref 3.5–5.1)
Sodium: 139 mmol/L (ref 135–145)
Total Bilirubin: 0.5 mg/dL (ref <1.2)
Total Protein: 6.9 g/dL (ref 6.5–8.1)

## 2023-08-05 LAB — CBC WITH DIFFERENTIAL (CANCER CENTER ONLY)
Abs Immature Granulocytes: 0.04 10*3/uL (ref 0.00–0.07)
Basophils Absolute: 0.1 10*3/uL (ref 0.0–0.1)
Basophils Relative: 1 %
Eosinophils Absolute: 0.2 10*3/uL (ref 0.0–0.5)
Eosinophils Relative: 2 %
HCT: 41.7 % (ref 39.0–52.0)
Hemoglobin: 14.6 g/dL (ref 13.0–17.0)
Immature Granulocytes: 1 %
Lymphocytes Relative: 19 %
Lymphs Abs: 1.3 10*3/uL (ref 0.7–4.0)
MCH: 32 pg (ref 26.0–34.0)
MCHC: 35 g/dL (ref 30.0–36.0)
MCV: 91.4 fL (ref 80.0–100.0)
Monocytes Absolute: 0.5 10*3/uL (ref 0.1–1.0)
Monocytes Relative: 7 %
Neutro Abs: 4.8 10*3/uL (ref 1.7–7.7)
Neutrophils Relative %: 70 %
Platelet Count: 165 10*3/uL (ref 150–400)
RBC: 4.56 MIL/uL (ref 4.22–5.81)
RDW: 13.8 % (ref 11.5–15.5)
WBC Count: 6.9 10*3/uL (ref 4.0–10.5)
nRBC: 0 % (ref 0.0–0.2)

## 2023-08-05 LAB — TSH: TSH: 2.053 u[IU]/mL (ref 0.350–4.500)

## 2023-08-05 LAB — LACTATE DEHYDROGENASE: LDH: 176 U/L (ref 98–192)

## 2023-08-05 MED ORDER — HEPARIN SOD (PORK) LOCK FLUSH 100 UNIT/ML IV SOLN
500.0000 [IU] | Freq: Once | INTRAVENOUS | Status: AC | PRN
  Administered 2023-08-05: 500 [IU]

## 2023-08-05 MED ORDER — SODIUM CHLORIDE 0.9% FLUSH
10.0000 mL | INTRAVENOUS | Status: DC | PRN
Start: 2023-08-05 — End: 2023-08-05
  Administered 2023-08-05: 10 mL

## 2023-08-05 MED ORDER — SODIUM CHLORIDE 0.9 % IV SOLN: Freq: Once | INTRAVENOUS | Status: DC

## 2023-08-05 MED ORDER — SODIUM CHLORIDE 0.9 % IV SOLN
480.0000 mg | Freq: Once | INTRAVENOUS | Status: AC
  Administered 2023-08-05: 480 mg via INTRAVENOUS
  Filled 2023-08-05: qty 48

## 2023-08-05 MED ORDER — SODIUM CHLORIDE 0.9 % IV SOLN
INTRAVENOUS | Status: DC
Start: 2023-08-05 — End: 2023-08-05

## 2023-08-05 NOTE — Progress Notes (Signed)
Hematology and Oncology Follow Up Visit  Kevin Hunter [id number] November 06, 1965 57 y.o. 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 He is feeling much better since last week. His visit to the ED did not uncover anything significant. He has cut back on caffeine and is staying well hydrated.  No fever, chills, n/v, cough, rash, dizziness, SOB, chest pain, palpitations, abdominal pain or changes in bowel or bladder habits.  No blood loss, bruising or petechiae.  No swelling, tenderness, numbness or tingling in his extremities.  No falls or syncope reported.  Appetite is good! Weight stable at 290 lbs.   ECOG Performance Status: 1 - Symptomatic but completely ambulatory  Medications:  Allergies as of 08/05/2023       Reactions   Paclitaxel Anaphylaxis   Penicillins Other (See Comments)   Unknown Reaction when he was an infant.        Medication List        Accurate as of August 05, 2023  8:44 AM. If you have any questions, ask your nurse or doctor.          amLODipine 5 MG tablet Commonly known as: NORVASC Take 5 mg by mouth at bedtime.   clonazePAM 1 MG tablet Commonly known as: KLONOPIN Take 1 mg by mouth 2 (two) times daily as needed for anxiety (May take additional 0.5mg  as needed for panic attack).   pantoprazole 40 MG tablet Commonly known as: PROTONIX TAKE 1 TABLET (40 MG TOTAL) BY MOUTH TWICE A DAY BEFORE MEALS What changed: See the new instructions.   psyllium 58.6 %  packet Commonly known as: METAMUCIL Take 1 packet by mouth daily.   QUEtiapine 400 MG 24 hr tablet Commonly known as: SEROQUEL XR Take 400 mg by mouth at bedtime.   sildenafil 20 MG tablet Commonly known as: REVATIO Take 20-100 mg by mouth daily as needed.   solifenacin 10 MG tablet Commonly known as: VESICARE Take 10 mg by mouth daily.   traZODone 100 MG tablet Commonly known as: DESYREL Take 300 mg by mouth at bedtime as needed for sleep.   venlafaxine 75 MG tablet Commonly known as: EFFEXOR Take 150 mg by mouth at bedtime.        Allergies:  Allergies  Allergen Reactions   Paclitaxel Anaphylaxis   Penicillins Other (See Comments)    Unknown Reaction when he was an infant.    Past Medical History, Surgical history, Social history, and Family History were reviewed and updated.  Review of Systems: All other 10 point review of systems is negative.   Physical Exam:  vitals were not taken for this visit.   Wt Readings from Last 3 Encounters:  07/29/23 282 lb 3 oz (128 kg)  07/29/23 284 lb (128.8 kg)  06/30/23 288 lb (130.6 kg)    Ocular: Sclerae unicteric, pupils equal, round and reactive to light Ear-nose-throat: Oropharynx clear, dentition fair Lymphatic: No cervical or supraclavicular adenopathy Lungs no rales or rhonchi, good excursion bilaterally  Heart regular rate and rhythm, no murmur appreciated Abd soft, nontender, positive bowel sounds MSK no focal spinal tenderness, no joint edema Neuro: non-focal, well-oriented, appropriate affect Breasts: Deferred   Lab Results  Component Value Date   WBC 6.9 08/05/2023   HGB 14.6 08/05/2023   HCT 41.7 08/05/2023   MCV 91.4 08/05/2023   PLT 165 08/05/2023   No results found for: "FERRITIN", "IRON", "TIBC", "UIBC", "IRONPCTSAT" Lab Results  Component Value Date   RBC 4.56 08/05/2023   No results found for: "KPAFRELGTCHN", "LAMBDASER", "KAPLAMBRATIO" No results found for: "IGGSERUM", "IGA",  "IGMSERUM" No results found for: "TOTALPROTELP", "ALBUMINELP", "A1GS", "A2GS", "BETS", "BETA2SER", "GAMS", "MSPIKE", "SPEI"   Chemistry      Component Value Date/Time   NA 139 07/29/2023 0925   K 3.4 (L) 07/29/2023 0925   CL 105 07/29/2023 0925   CO2 23 07/29/2023 0925   BUN 13 07/29/2023 0925   CREATININE 2.00 (H) 07/29/2023 0925   CREATININE 1.65 (H) 07/29/2023 0733      Component Value Date/Time   CALCIUM 9.2 07/29/2023 0925   ALKPHOS 84 07/29/2023 0733   AST 18 07/29/2023 0733   ALT 22 07/29/2023 0733   BILITOT 0.7 07/29/2023 0733       Impression and Plan: 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 He completed his chemotherapy along with immunotherapy.  We will resume Opdivo treatment today as planned. Per MD.  Follow-up in 4 weeks.   Eileen Stanford, NP 12/20/20248:44 AM

## 2023-08-05 NOTE — Progress Notes (Signed)
Verbal consent for treatment today with K level of 3.2 given by Eileen Stanford

## 2023-08-05 NOTE — Patient Instructions (Signed)

## 2023-08-05 NOTE — Patient Instructions (Signed)
[facility]  Discharge Instructions: Thank you for choosing [facility] to provide your oncology and hematology care.   If you have a lab appointment with the Cancer Center, please go directly to the Cancer Center and check in at the registration area.  Wear comfortable clothing and clothing appropriate for easy access to any Portacath or PICC line.   We strive to give you quality time with your provider. You may need to reschedule your appointment if you arrive late (15 or more minutes).  Arriving late affects you and other patients whose appointments are after yours.  Also, if you miss three or more appointments without notifying the office, you may be dismissed from the clinic at the provider's discretion.      For prescription refill requests, have your pharmacy contact our office and allow 72 hours for refills to be completed.    Today you received the following chemotherapy and/or immunotherapy agents NIVOLUMAB      To help prevent nausea and vomiting after your treatment, we encourage you to take your nausea medication as directed.  BELOW ARE SYMPTOMS THAT SHOULD BE REPORTED IMMEDIATELY: *FEVER GREATER THAN 100.4 F (38 C) OR HIGHER *CHILLS OR SWEATING *NAUSEA AND VOMITING THAT IS NOT CONTROLLED WITH YOUR NAUSEA MEDICATION *UNUSUAL SHORTNESS OF BREATH *UNUSUAL BRUISING OR BLEEDING *URINARY PROBLEMS (pain or burning when urinating, or frequent urination) *BOWEL PROBLEMS (unusual diarrhea, constipation, pain near the anus) TENDERNESS IN MOUTH AND THROAT WITH OR WITHOUT PRESENCE OF ULCERS (sore throat, sores in mouth, or a toothache) UNUSUAL RASH, SWELLING OR PAIN  UNUSUAL VAGINAL DISCHARGE OR ITCHING   Items with * indicate a potential emergency and should be followed up as soon as possible or go to the Emergency Department if any problems should occur.  Please show the CHEMOTHERAPY ALERT CARD or IMMUNOTHERAPY  ALERT CARD at check-in to the Emergency Department and triage nurse. Should you have questions after your visit or need to cancel or reschedule your appointment, please contact [facility] - A DEPT OF [facility]  [phone] and follow the prompts.  Office hours are 8:00 a.m. to 4:30 p.m. Monday - Friday. Please note that voicemails left after 4:00 p.m. may not be returned until the following business day.  We are closed weekends and major holidays. You have access to a nurse at all times for urgent questions. Please call the main number to the clinic [phone] and follow the prompts.  For any non-urgent questions, you may also contact your provider using MyChart. We now offer e-Visits for anyone 52 and older to request care online for non-urgent symptoms. For details visit [email or web address].   Also download the MyChart app! Go to the app store, search "MyChart", open the app, select [place], and log in with your MyChart username and password.

## 2023-08-11 ENCOUNTER — Other Ambulatory Visit: Payer: 59

## 2023-08-11 ENCOUNTER — Ambulatory Visit: Payer: 59

## 2023-08-11 ENCOUNTER — Ambulatory Visit: Payer: 59 | Admitting: Hematology & Oncology

## 2023-08-19 ENCOUNTER — Other Ambulatory Visit: Payer: 59

## 2023-08-19 ENCOUNTER — Inpatient Hospital Stay: Payer: BC Managed Care – PPO

## 2023-08-19 ENCOUNTER — Ambulatory Visit: Payer: 59

## 2023-08-29 ENCOUNTER — Encounter: Payer: Self-pay | Admitting: Hematology & Oncology

## 2023-08-30 ENCOUNTER — Encounter: Payer: Self-pay | Admitting: Hematology & Oncology

## 2023-09-02 ENCOUNTER — Inpatient Hospital Stay: Payer: BC Managed Care – PPO | Attending: Internal Medicine

## 2023-09-02 ENCOUNTER — Inpatient Hospital Stay (HOSPITAL_BASED_OUTPATIENT_CLINIC_OR_DEPARTMENT_OTHER): Payer: BC Managed Care – PPO | Admitting: Family

## 2023-09-02 ENCOUNTER — Inpatient Hospital Stay: Payer: BC Managed Care – PPO

## 2023-09-02 ENCOUNTER — Inpatient Hospital Stay: Payer: BC Managed Care – PPO | Admitting: Hematology & Oncology

## 2023-09-02 ENCOUNTER — Encounter: Payer: Self-pay | Admitting: Hematology & Oncology

## 2023-09-02 ENCOUNTER — Encounter: Payer: Self-pay | Admitting: Family

## 2023-09-02 VITALS — BP 156/87 | HR 80 | Temp 97.9°F | Resp 19 | Ht 71.0 in | Wt 285.0 lb

## 2023-09-02 DIAGNOSIS — C7970 Secondary malignant neoplasm of unspecified adrenal gland: Secondary | ICD-10-CM | POA: Diagnosis not present

## 2023-09-02 DIAGNOSIS — E876 Hypokalemia: Secondary | ICD-10-CM | POA: Insufficient documentation

## 2023-09-02 DIAGNOSIS — C3491 Malignant neoplasm of unspecified part of right bronchus or lung: Secondary | ICD-10-CM

## 2023-09-02 DIAGNOSIS — C7931 Secondary malignant neoplasm of brain: Secondary | ICD-10-CM | POA: Diagnosis not present

## 2023-09-02 DIAGNOSIS — C3411 Malignant neoplasm of upper lobe, right bronchus or lung: Secondary | ICD-10-CM | POA: Insufficient documentation

## 2023-09-02 DIAGNOSIS — Z79620 Long term (current) use of immunosuppressive biologic: Secondary | ICD-10-CM | POA: Diagnosis not present

## 2023-09-02 DIAGNOSIS — Z5112 Encounter for antineoplastic immunotherapy: Secondary | ICD-10-CM | POA: Insufficient documentation

## 2023-09-02 LAB — CBC WITH DIFFERENTIAL (CANCER CENTER ONLY)
Abs Immature Granulocytes: 0.03 10*3/uL (ref 0.00–0.07)
Basophils Absolute: 0 10*3/uL (ref 0.0–0.1)
Basophils Relative: 1 %
Eosinophils Absolute: 0.1 10*3/uL (ref 0.0–0.5)
Eosinophils Relative: 1 %
HCT: 41.7 % (ref 39.0–52.0)
Hemoglobin: 14.6 g/dL (ref 13.0–17.0)
Immature Granulocytes: 0 %
Lymphocytes Relative: 16 %
Lymphs Abs: 1.1 10*3/uL (ref 0.7–4.0)
MCH: 31.9 pg (ref 26.0–34.0)
MCHC: 35 g/dL (ref 30.0–36.0)
MCV: 91.2 fL (ref 80.0–100.0)
Monocytes Absolute: 0.4 10*3/uL (ref 0.1–1.0)
Monocytes Relative: 6 %
Neutro Abs: 5.1 10*3/uL (ref 1.7–7.7)
Neutrophils Relative %: 76 %
Platelet Count: 154 10*3/uL (ref 150–400)
RBC: 4.57 MIL/uL (ref 4.22–5.81)
RDW: 14 % (ref 11.5–15.5)
WBC Count: 6.8 10*3/uL (ref 4.0–10.5)
nRBC: 0 % (ref 0.0–0.2)

## 2023-09-02 LAB — CMP (CANCER CENTER ONLY)
ALT: 21 U/L (ref 0–44)
AST: 17 U/L (ref 15–41)
Albumin: 4.3 g/dL (ref 3.5–5.0)
Alkaline Phosphatase: 85 U/L (ref 38–126)
Anion gap: 8 (ref 5–15)
BUN: 16 mg/dL (ref 6–20)
CO2: 28 mmol/L (ref 22–32)
Calcium: 8.5 mg/dL — ABNORMAL LOW (ref 8.9–10.3)
Chloride: 103 mmol/L (ref 98–111)
Creatinine: 1.07 mg/dL (ref 0.61–1.24)
GFR, Estimated: >60 mL/min (ref >60)
Glucose, Bld: 109 mg/dL — ABNORMAL HIGH (ref 70–99)
Potassium: 2.9 mmol/L — ABNORMAL LOW (ref 3.5–5.1)
Sodium: 139 mmol/L (ref 135–145)
Total Bilirubin: 0.4 mg/dL (ref 0.0–1.2)
Total Protein: 7.1 g/dL (ref 6.5–8.1)

## 2023-09-02 LAB — TSH: TSH: 2.021 u[IU]/mL (ref 0.350–4.500)

## 2023-09-02 LAB — LACTATE DEHYDROGENASE: LDH: 173 U/L (ref 98–192)

## 2023-09-02 MED ORDER — POTASSIUM CHLORIDE CRYS ER 20 MEQ PO TBCR: 20.0000 meq | EXTENDED_RELEASE_TABLET | Freq: Every day | ORAL | 0 refills | Status: DC

## 2023-09-02 MED ORDER — SODIUM CHLORIDE 0.9 % IV SOLN
Freq: Once | INTRAVENOUS | Status: AC
Start: 2023-09-02 — End: 2023-09-02

## 2023-09-02 MED ORDER — SODIUM CHLORIDE 0.9 % IV SOLN
480.0000 mg | Freq: Once | INTRAVENOUS | Status: AC
  Administered 2023-09-02: 480 mg via INTRAVENOUS
  Filled 2023-09-02: qty 48

## 2023-09-02 MED ORDER — SODIUM CHLORIDE 0.9% FLUSH
10.0000 mL | Freq: Once | INTRAVENOUS | Status: AC
  Administered 2023-09-02: 10 mL

## 2023-09-02 MED ORDER — POTASSIUM CHLORIDE CRYS ER 20 MEQ PO TBCR
40.0000 meq | EXTENDED_RELEASE_TABLET | Freq: Once | ORAL | Status: AC
  Administered 2023-09-02: 40 meq via ORAL
  Filled 2023-09-02: qty 2

## 2023-09-02 MED ORDER — SODIUM CHLORIDE 0.9% FLUSH
10.0000 mL | INTRAVENOUS | Status: DC | PRN
  Administered 2023-09-02: 10 mL

## 2023-09-02 MED ORDER — POTASSIUM CHLORIDE CRYS ER 20 MEQ PO TBCR
40.0000 meq | EXTENDED_RELEASE_TABLET | ORAL | Status: AC
Start: 2023-09-02 — End: 2023-09-02
  Administered 2023-09-02: 40 meq via ORAL
  Filled 2023-09-02: qty 2

## 2023-09-02 MED ORDER — HEPARIN SOD (PORK) LOCK FLUSH 100 UNIT/ML IV SOLN
500.0000 [IU] | Freq: Once | INTRAVENOUS | Status: AC | PRN
  Administered 2023-09-02: 500 [IU]

## 2023-09-02 NOTE — Progress Notes (Signed)
Hematology and Oncology Follow Up Visit  LUXTON Hunter [id number] 09/13/1965 58 y.o. 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 He is doing well but has some sinus congestion with a dry cough. Lung sounds are clear throughout.  Mild SOB with over exertion.  No fever, chills, n/v, rash, dizziness, chest pain, palpitations, abdominal pain or changes in bowel or bladder habits.  TSH at last visit was stable at 2.053. Today's result is pending.  No swelling, tenderness, numbness or tingling in his extremities.  No falls or syncope.  No blood loss noted. No bruising or petechiae.  Appetite and hydration are good. Weight is stable at 285 lbs.   ECOG Performance Status: 1 - Symptomatic but completely ambulatory  Medications:  Allergies as of 09/02/2023       Reactions   Paclitaxel Anaphylaxis   Penicillins Other (See Comments)   Unknown Reaction when he was an infant.        Medication List        Accurate as of September 02, 2023  8:33 AM. If you have any questions, ask your nurse or doctor.          amLODipine 5 MG tablet Commonly known as: NORVASC Take 5 mg by mouth at bedtime.   clonazePAM 1 MG tablet Commonly known as: KLONOPIN Take 1 mg by mouth 2 (two) times daily as needed for anxiety (May take additional 0.5mg  as needed for panic attack).   pantoprazole 40 MG tablet Commonly known as: PROTONIX TAKE 1 TABLET (40 MG TOTAL) BY MOUTH TWICE A DAY BEFORE MEALS What changed: See the new  instructions.   psyllium 58.6 % packet Commonly known as: METAMUCIL Take 1 packet by mouth daily.   QUEtiapine 400 MG 24 hr tablet Commonly known as: SEROQUEL XR Take 400 mg by mouth at bedtime.   sildenafil 20 MG tablet Commonly known as: REVATIO Take 20-100 mg by mouth daily as needed.   solifenacin 10 MG tablet Commonly known as: VESICARE Take 10 mg by mouth daily.   traZODone 100 MG tablet Commonly known as: DESYREL Take 300 mg by mouth at bedtime as needed for sleep.   venlafaxine 75 MG tablet Commonly known as: EFFEXOR Take 150 mg by mouth at bedtime.        Allergies:  Allergies  Allergen Reactions   Paclitaxel Anaphylaxis   Penicillins Other (See Comments)    Unknown Reaction when he was an infant.    Past Medical History, Surgical history, Social history, and Family History were reviewed and updated.  Review of Systems: All other 10 point review of systems is negative.   Physical Exam:  height is 5\' 11"  (1.803 m) and weight is 285 lb (129.3 kg). His oral temperature is 97.9 F (36.6 C). His blood pressure is 156/87 (abnormal) and his pulse is 80. His respiration is 19 and oxygen saturation is 92%.   Wt Readings from Last 3 Encounters:  09/02/23 285 lb (129.3 kg)  08/05/23  290 lb (131.5 kg)  07/29/23 282 lb 3 oz (128 kg)    Ocular: Sclerae unicteric, pupils equal, round and reactive to light Ear-nose-throat: Oropharynx clear, dentition fair Lymphatic: No cervical or supraclavicular adenopathy Lungs no rales or rhonchi, good excursion bilaterally Heart regular rate and rhythm, no murmur appreciated Abd soft, nontender, positive bowel sounds MSK no focal spinal tenderness, no joint edema Neuro: non-focal, well-oriented, appropriate affect Breasts: Deferred   Lab Results  Component Value Date   WBC 6.8 09/02/2023   HGB 14.6 09/02/2023   HCT 41.7 09/02/2023   MCV 91.2 09/02/2023   PLT 154 09/02/2023   No results found for: "FERRITIN", "IRON",  "TIBC", "UIBC", "IRONPCTSAT" Lab Results  Component Value Date   RBC 4.57 09/02/2023   No results found for: "KPAFRELGTCHN", "LAMBDASER", "KAPLAMBRATIO" No results found for: "IGGSERUM", "IGA", "IGMSERUM" No results found for: "TOTALPROTELP", "ALBUMINELP", "A1GS", "A2GS", "BETS", "BETA2SER", "GAMS", "MSPIKE", "SPEI"   Chemistry      Component Value Date/Time   NA 139 08/05/2023 0815   K 3.2 (L) 08/05/2023 0815   CL 102 08/05/2023 0815   CO2 28 08/05/2023 0815   BUN 15 08/05/2023 0815   CREATININE 1.13 08/05/2023 0815      Component Value Date/Time   CALCIUM 8.7 (L) 08/05/2023 0815   ALKPHOS 81 08/05/2023 0815   AST 15 08/05/2023 0815   ALT 18 08/05/2023 0815   BILITOT 0.5 08/05/2023 0815       Impression and Plan: 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 He completed his chemotherapy along with immunotherapy.  We will proceed Opdivo treatment today as planned. Per MD.  40 meq now and again before he leaves for hypokalemia. He will then take 20 meq daily for 7 days.  Follow-up in 4 weeks.     Eileen Stanford, NP 1/17/20258:33 AM

## 2023-09-02 NOTE — Addendum Note (Signed)
Addended by: Josph Macho on: 09/02/2023 08:51 AM   Modules accepted: Orders

## 2023-09-02 NOTE — Progress Notes (Signed)
Pt. arrived with mild cold lasting appr. 1 week. Saw APP, K+ low, oral k given in clinic and new script send. Neutropenic precaution reviewed and increase K+ diet.  Reminded with above upon d/c. Pt. will stop by front desk for appt.

## 2023-09-02 NOTE — Patient Instructions (Signed)
[facility]  Discharge Instructions: Thank you for choosing [facility] to provide your oncology and hematology care.   If you have a lab appointment with the Cancer Center, please go directly to the Cancer Center and check in at the registration area.  Wear comfortable clothing and clothing appropriate for easy access to any Portacath or PICC line.   We strive to give you quality time with your provider. You may need to reschedule your appointment if you arrive late (15 or more minutes).  Arriving late affects you and other patients whose appointments are after yours.  Also, if you miss three or more appointments without notifying the office, you may be dismissed from the clinic at the provider's discretion.      For prescription refill requests, have your pharmacy contact our office and allow 72 hours for refills to be completed.    Today you received the following chemotherapy and/or immunotherapy agents nivolumab      To help prevent nausea and vomiting after your treatment, we encourage you to take your nausea medication as directed.  BELOW ARE SYMPTOMS THAT SHOULD BE REPORTED IMMEDIATELY: *FEVER GREATER THAN 100.4 F (38 C) OR HIGHER *CHILLS OR SWEATING *NAUSEA AND VOMITING THAT IS NOT CONTROLLED WITH YOUR NAUSEA MEDICATION *UNUSUAL SHORTNESS OF BREATH *UNUSUAL BRUISING OR BLEEDING *URINARY PROBLEMS (pain or burning when urinating, or frequent urination) *BOWEL PROBLEMS (unusual diarrhea, constipation, pain near the anus) TENDERNESS IN MOUTH AND THROAT WITH OR WITHOUT PRESENCE OF ULCERS (sore throat, sores in mouth, or a toothache) UNUSUAL RASH, SWELLING OR PAIN  UNUSUAL VAGINAL DISCHARGE OR ITCHING   Items with * indicate a potential emergency and should be followed up as soon as possible or go to the Emergency Department if any problems should occur.  Please show the CHEMOTHERAPY ALERT CARD or IMMUNOTHERAPY  ALERT CARD at check-in to the Emergency Department and triage nurse. Should you have questions after your visit or need to cancel or reschedule your appointment, please contact [facility] - A DEPT OF [facility]  [phone] and follow the prompts.  Office hours are 8:00 a.m. to 4:30 p.m. Monday - Friday. Please note that voicemails left after 4:00 p.m. may not be returned until the following business day.  We are closed weekends and major holidays. You have access to a nurse at all times for urgent questions. Please call the main number to the clinic [phone] and follow the prompts.  For any non-urgent questions, you may also contact your provider using MyChart. We now offer e-Visits for anyone 69 and older to request care online for non-urgent symptoms. For details visit [email or web address].   Also download the MyChart app! Go to the app store, search "MyChart", open the app, select [place], and log in with your MyChart username and password.

## 2023-09-15 ENCOUNTER — Encounter: Payer: Self-pay | Admitting: Internal Medicine

## 2023-09-16 ENCOUNTER — Other Ambulatory Visit: Payer: Self-pay

## 2023-09-29 ENCOUNTER — Ambulatory Visit
Admission: RE | Admit: 2023-09-29 | Discharge: 2023-09-29 | Disposition: A | Discharge status: Home / Self Care | Payer: BC Managed Care – PPO | Source: Ambulatory Visit | Attending: Internal Medicine | Admitting: Internal Medicine

## 2023-09-29 DIAGNOSIS — C3490 Malignant neoplasm of unspecified part of unspecified bronchus or lung: Secondary | ICD-10-CM

## 2023-09-29 MED ORDER — GADOPICLENOL 0.5 MMOL/ML IV SOLN
10.0000 mL | Freq: Once | INTRAVENOUS | Status: AC | PRN
  Administered 2023-09-29: 10 mL via INTRAVENOUS

## 2023-09-30 ENCOUNTER — Inpatient Hospital Stay: Payer: BC Managed Care – PPO | Attending: Internal Medicine

## 2023-09-30 ENCOUNTER — Inpatient Hospital Stay: Payer: BC Managed Care – PPO

## 2023-09-30 ENCOUNTER — Encounter: Payer: Self-pay | Admitting: Hematology & Oncology

## 2023-09-30 ENCOUNTER — Inpatient Hospital Stay: Payer: BC Managed Care – PPO | Admitting: Hematology & Oncology

## 2023-09-30 ENCOUNTER — Other Ambulatory Visit: Payer: Self-pay | Admitting: *Deleted

## 2023-09-30 ENCOUNTER — Other Ambulatory Visit: Payer: Self-pay | Admitting: Hematology & Oncology

## 2023-09-30 VITALS — BP 133/84 | HR 59

## 2023-09-30 VITALS — BP 105/86 | HR 65 | Temp 98.2°F | Resp 20 | Ht 71.0 in | Wt 282.9 lb

## 2023-09-30 DIAGNOSIS — Z79620 Long term (current) use of immunosuppressive biologic: Secondary | ICD-10-CM | POA: Diagnosis not present

## 2023-09-30 DIAGNOSIS — Z5112 Encounter for antineoplastic immunotherapy: Secondary | ICD-10-CM | POA: Diagnosis present

## 2023-09-30 DIAGNOSIS — C3411 Malignant neoplasm of upper lobe, right bronchus or lung: Secondary | ICD-10-CM | POA: Insufficient documentation

## 2023-09-30 DIAGNOSIS — C7931 Secondary malignant neoplasm of brain: Secondary | ICD-10-CM | POA: Insufficient documentation

## 2023-09-30 DIAGNOSIS — C7800 Secondary malignant neoplasm of unspecified lung: Secondary | ICD-10-CM | POA: Diagnosis not present

## 2023-09-30 DIAGNOSIS — C7970 Secondary malignant neoplasm of unspecified adrenal gland: Secondary | ICD-10-CM | POA: Diagnosis not present

## 2023-09-30 DIAGNOSIS — C3491 Malignant neoplasm of unspecified part of right bronchus or lung: Secondary | ICD-10-CM

## 2023-09-30 LAB — CBC WITH DIFFERENTIAL (CANCER CENTER ONLY)
Abs Immature Granulocytes: 0.02 10*3/uL (ref 0.00–0.07)
Basophils Absolute: 0.1 10*3/uL (ref 0.0–0.1)
Basophils Relative: 1 %
Eosinophils Absolute: 0.1 10*3/uL (ref 0.0–0.5)
Eosinophils Relative: 2 %
HCT: 42.1 % (ref 39.0–52.0)
Hemoglobin: 14.6 g/dL (ref 13.0–17.0)
Immature Granulocytes: 0 %
Lymphocytes Relative: 18 %
Lymphs Abs: 1.4 10*3/uL (ref 0.7–4.0)
MCH: 31.9 pg (ref 26.0–34.0)
MCHC: 34.7 g/dL (ref 30.0–36.0)
MCV: 92.1 fL (ref 80.0–100.0)
Monocytes Absolute: 0.6 10*3/uL (ref 0.1–1.0)
Monocytes Relative: 9 %
Neutro Abs: 5.3 10*3/uL (ref 1.7–7.7)
Neutrophils Relative %: 70 %
Platelet Count: 170 10*3/uL (ref 150–400)
RBC: 4.57 MIL/uL (ref 4.22–5.81)
RDW: 14.6 % (ref 11.5–15.5)
WBC Count: 7.5 10*3/uL (ref 4.0–10.5)
nRBC: 0 % (ref 0.0–0.2)

## 2023-09-30 LAB — CMP (CANCER CENTER ONLY)
ALT: 19 U/L (ref 0–44)
AST: 16 U/L (ref 15–41)
Albumin: 4.3 g/dL (ref 3.5–5.0)
Alkaline Phosphatase: 80 U/L (ref 38–126)
Anion gap: 9 (ref 5–15)
BUN: 13 mg/dL (ref 6–20)
CO2: 29 mmol/L (ref 22–32)
Calcium: 8.9 mg/dL (ref 8.9–10.3)
Chloride: 103 mmol/L (ref 98–111)
Creatinine: 1.23 mg/dL (ref 0.61–1.24)
GFR, Estimated: >60 mL/min (ref >60)
Glucose, Bld: 93 mg/dL (ref 70–99)
Potassium: 3.1 mmol/L — ABNORMAL LOW (ref 3.5–5.1)
Sodium: 141 mmol/L (ref 135–145)
Total Bilirubin: 0.4 mg/dL (ref 0.0–1.2)
Total Protein: 7 g/dL (ref 6.5–8.1)

## 2023-09-30 LAB — TSH: TSH: 1.905 u[IU]/mL (ref 0.350–4.500)

## 2023-09-30 MED ORDER — POTASSIUM CHLORIDE CRYS ER 20 MEQ PO TBCR
40.0000 meq | EXTENDED_RELEASE_TABLET | Freq: Once | ORAL | Status: AC
  Administered 2023-09-30: 40 meq via ORAL
  Filled 2023-09-30: qty 2

## 2023-09-30 MED ORDER — HEPARIN SOD (PORK) LOCK FLUSH 100 UNIT/ML IV SOLN
500.0000 [IU] | Freq: Once | INTRAVENOUS | Status: AC | PRN
Start: 2023-09-30 — End: 2023-09-30
  Administered 2023-09-30: 500 [IU]

## 2023-09-30 MED ORDER — SODIUM CHLORIDE 0.9% FLUSH
10.0000 mL | INTRAVENOUS | Status: DC | PRN
Start: 2023-09-30 — End: 2023-09-30
  Administered 2023-09-30: 10 mL

## 2023-09-30 MED ORDER — SODIUM CHLORIDE 0.9 % IV SOLN
480.0000 mg | Freq: Once | INTRAVENOUS | Status: AC
  Administered 2023-09-30: 480 mg via INTRAVENOUS
  Filled 2023-09-30: qty 48

## 2023-09-30 MED ORDER — POTASSIUM CHLORIDE CRYS ER 20 MEQ PO TBCR: 20.0000 meq | EXTENDED_RELEASE_TABLET | Freq: Once | ORAL | 2 refills | Status: DC

## 2023-09-30 MED ORDER — SODIUM CHLORIDE 0.9 % IV SOLN: Freq: Once | INTRAVENOUS | Status: AC

## 2023-09-30 NOTE — Patient Instructions (Signed)

## 2023-09-30 NOTE — Patient Instructions (Signed)
[facility]  Discharge Instructions: Thank you for choosing [facility] to provide your oncology and hematology care.   If you have a lab appointment with the Cancer Center, please go directly to the Cancer Center and check in at the registration area.  Wear comfortable clothing and clothing appropriate for easy access to any Portacath or PICC line.   We strive to give you quality time with your provider. You may need to reschedule your appointment if you arrive late (15 or more minutes).  Arriving late affects you and other patients whose appointments are after yours.  Also, if you miss three or more appointments without notifying the office, you may be dismissed from the clinic at the provider's discretion.      For prescription refill requests, have your pharmacy contact our office and allow 72 hours for refills to be completed.    Today you received the following chemotherapy and/or immunotherapy agents Nivolumab    To help prevent nausea and vomiting after your treatment, we encourage you to take your nausea medication as directed.  BELOW ARE SYMPTOMS THAT SHOULD BE REPORTED IMMEDIATELY: *FEVER GREATER THAN 100.4 F (38 C) OR HIGHER *CHILLS OR SWEATING *NAUSEA AND VOMITING THAT IS NOT CONTROLLED WITH YOUR NAUSEA MEDICATION *UNUSUAL SHORTNESS OF BREATH *UNUSUAL BRUISING OR BLEEDING *URINARY PROBLEMS (pain or burning when urinating, or frequent urination) *BOWEL PROBLEMS (unusual diarrhea, constipation, pain near the anus) TENDERNESS IN MOUTH AND THROAT WITH OR WITHOUT PRESENCE OF ULCERS (sore throat, sores in mouth, or a toothache) UNUSUAL RASH, SWELLING OR PAIN  UNUSUAL VAGINAL DISCHARGE OR ITCHING   Items with * indicate a potential emergency and should be followed up as soon as possible or go to the Emergency Department if any problems should occur.  Please show the CHEMOTHERAPY ALERT CARD or IMMUNOTHERAPY  ALERT CARD at check-in to the Emergency Department and triage nurse. Should you have questions after your visit or need to cancel or reschedule your appointment, please contact [facility] - A DEPT OF [facility]  [phone] and follow the prompts.  Office hours are 8:00 a.m. to 4:30 p.m. Monday - Friday. Please note that voicemails left after 4:00 p.m. may not be returned until the following business day.  We are closed weekends and major holidays. You have access to a nurse at all times for urgent questions. Please call the main number to the clinic [phone] and follow the prompts.  For any non-urgent questions, you may also contact your provider using MyChart. We now offer e-Visits for anyone 14 and older to request care online for non-urgent symptoms. For details visit [email or web address].   Also download the MyChart app! Go to the app store, search "MyChart", open the app, select [place], and log in with your MyChart username and password.

## 2023-09-30 NOTE — Progress Notes (Signed)
Hematology and Oncology Follow Up Visit  Kevin Hunter [id number] 1966-04-09 58 y.o. 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  He had a MRI of the brain yesterday.  Thankfully, the MRI of the brain showed that the tumors that he had was still decreasing.  I am so happy for him.  He is tolerating the nivolumab incredibly well.  He has had no problems with diarrhea.  There is been no rash.  He has had no cough or shortness of breath.  He has had no nausea or vomiting.  He has had no bleeding.  His last TSH was 2.  He has had no fever.  He has had no diuresis.  He is still working pretty much full-time.  He is quite busy at work.  His last PET scan was done on 06/17/2023.  Overall, I would say his performance status is probably ECOG 0.   Medications:  Allergies as of 09/30/2023       Reactions   Paclitaxel Anaphylaxis   Penicillins Other (See Comments)   Unknown Reaction when he was an infant.        Medication List        Accurate as of September 30, 2023  8:39 AM. If you have any questions, ask your nurse or doctor.          STOP taking these medications    potassium chloride SA 20 MEQ tablet Commonly known as: KLOR-CON M Stopped by: Josph Macho       TAKE these medications    amLODipine 5 MG tablet Commonly known as: NORVASC Take 5 mg by mouth at bedtime.   clonazePAM 1 MG tablet Commonly known as: KLONOPIN Take 1 mg by mouth 2 (two) times daily as needed for anxiety  (May take additional 0.5mg  as needed for panic attack).   pantoprazole 40 MG tablet Commonly known as: PROTONIX TAKE 1 TABLET (40 MG TOTAL) BY MOUTH TWICE A DAY BEFORE MEALS What changed: See the new instructions.   psyllium 58.6 % packet Commonly known as: METAMUCIL Take 1 packet by mouth daily.   QUEtiapine 400 MG 24 hr tablet Commonly known as: SEROQUEL XR Take 400 mg by mouth at bedtime.   sildenafil 20 MG tablet Commonly known as: REVATIO Take 20-100 mg by mouth daily as needed.   solifenacin 10 MG tablet Commonly known as: VESICARE Take 10 mg by mouth daily.   traZODone 100 MG tablet Commonly known as: DESYREL Take 300 mg by mouth at bedtime as needed for sleep.   venlafaxine 75 MG tablet Commonly known as: EFFEXOR Take 150 mg by mouth at bedtime.        Allergies:  Allergies  Allergen Reactions   Paclitaxel Anaphylaxis   Penicillins Other (See Comments)    Unknown Reaction when he was an infant.    Past Medical History, Surgical history, Social history, and Family History were reviewed and updated.  Review of Systems: Review of Systems  Constitutional:  Negative.   HENT: Negative.    Eyes: Negative.   Respiratory: Negative.    Cardiovascular: Negative.   Gastrointestinal: Negative.   Genitourinary: Negative.   Musculoskeletal: Negative.   Skin: Negative.   Neurological:  Positive for tingling.  Endo/Heme/Allergies: Negative.   Psychiatric/Behavioral: Negative.        Physical Exam: Vital signs are temperature of 98.2.  Pulse 65.  Blood pressure 105/86.  Weight is 282 pounds.      Wt Readings from Last 3 Encounters:  09/30/23 282 lb 15.2 oz (128.3 kg)  09/02/23 285 lb (129.3 kg)  08/05/23 290 lb (131.5 kg)    Physical Exam Vitals reviewed.  HENT:     Head: Normocephalic and atraumatic.  Eyes:     Pupils: Pupils are equal, round, and reactive to light.  Cardiovascular:     Rate and Rhythm: Normal rate and regular rhythm.     Heart  sounds: Normal heart sounds.     Comments: Cardiac exam is markedly tachycardic but regular.  He has no murmurs, rubs or bruits. Pulmonary:     Effort: Pulmonary effort is normal.     Breath sounds: Normal breath sounds.     Comments: His lungs sound clear bilaterally.  I do not hear any wheezing.  There is no rhonchi. Abdominal:     General: Bowel sounds are normal.     Palpations: Abdomen is soft.  Musculoskeletal:        General: No tenderness or deformity. Normal range of motion.     Cervical back: Normal range of motion.  Lymphadenopathy:     Cervical: No cervical adenopathy.  Skin:    General: Skin is warm and dry.     Findings: No erythema or rash.  Neurological:     Mental Status: He is alert and oriented to person, place, and time.  Psychiatric:        Behavior: Behavior normal.        Thought Content: Thought content normal.        Judgment: Judgment normal.     Lab Results  Component Value Date   WBC 7.5 09/30/2023   HGB 14.6 09/30/2023   HCT 42.1 09/30/2023   MCV 92.1 09/30/2023   PLT 170 09/30/2023   No results found for: "FERRITIN", "IRON", "TIBC", "UIBC", "IRONPCTSAT" Lab Results  Component Value Date   RBC 4.57 09/30/2023   No results found for: "KPAFRELGTCHN", "LAMBDASER", "KAPLAMBRATIO" No results found for: "IGGSERUM", "IGA", "IGMSERUM" No results found for: "TOTALPROTELP", "ALBUMINELP", "A1GS", "A2GS", "BETS", "BETA2SER", "GAMS", "MSPIKE", "SPEI"   Chemistry      Component Value Date/Time   NA 141 09/30/2023 0728   K 3.1 (L) 09/30/2023 0728   CL 103 09/30/2023 0728   CO2 29 09/30/2023 0728   BUN 13 09/30/2023 0728   CREATININE 1.23 09/30/2023 0728      Component Value Date/Time   CALCIUM 8.9 09/30/2023 0728   ALKPHOS 80 09/30/2023 0728   AST 16 09/30/2023 0728   ALT 19 09/30/2023 0728   BILITOT 0.4 09/30/2023 0728       Impression and Plan: Kevin Hunter is a very pleasant 58 yo caucasian gentleman with 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 He completed his chemotherapy along with immunotherapy.   He now is on maintenance therapy with nivolumab and tolerating well with a nice response.  So far, we have not seen any evidence of recurrent or progressive disease.  We probably do not have to  do another PET scan on probably until April.  It seems like his heart is doing a lot better now.  I am happy about that part.  I am glad that his MRI of the brain also look great.  We will plan to get him back in another month.   2/14/20258:39 AM

## 2023-10-01 ENCOUNTER — Other Ambulatory Visit: Payer: Self-pay

## 2023-10-04 ENCOUNTER — Telehealth: Payer: Self-pay | Admitting: Internal Medicine

## 2023-10-04 ENCOUNTER — Inpatient Hospital Stay (HOSPITAL_BASED_OUTPATIENT_CLINIC_OR_DEPARTMENT_OTHER): Payer: BC Managed Care – PPO | Admitting: Internal Medicine

## 2023-10-04 VITALS — BP 152/87 | HR 74 | Temp 98.2°F | Resp 14 | Wt 287.3 lb

## 2023-10-04 DIAGNOSIS — C3490 Malignant neoplasm of unspecified part of unspecified bronchus or lung: Secondary | ICD-10-CM | POA: Diagnosis not present

## 2023-10-04 DIAGNOSIS — Z5112 Encounter for antineoplastic immunotherapy: Secondary | ICD-10-CM | POA: Diagnosis not present

## 2023-10-04 DIAGNOSIS — C7931 Secondary malignant neoplasm of brain: Secondary | ICD-10-CM | POA: Diagnosis not present

## 2023-10-04 NOTE — Telephone Encounter (Signed)
 Marland Kitchen

## 2023-10-04 NOTE — Progress Notes (Signed)
[facility] [address]  [place], [place] [address] [phone]   Interval Evaluation  Date of Service: 10/04/23 Patient Name: Kevin Hunter Patient MRN: [id number] Patient DOB: 1966/01/12 Provider: Henreitta Leber, MD  Identifying Statement:  Kevin Hunter is a 58 y.o. male with Non-small cell lung cancer metastatic to brain [facility])   Primary Cancer:  Oncologic History: Oncology History  Lung cancer, primary, with metastasis from lung to other site, right (HCC)  05/27/2021 Initial Diagnosis   Lung cancer, primary, with metastasis from lung to other site, right [facility])   05/27/2021 Cancer Staging   Staging form: Lung, AJCC 8th Edition - Clinical stage from 05/27/2021: Stage IV (cT3, cN2, pM1) - Signed by Josph Macho, MD on 05/27/2021 Histologic grade (G): G2 Histologic grading system: 4 grade system   06/25/2021 - 07/14/2021 Chemotherapy   Patient is on Treatment Plan : LUNG NSCLC SQUAMOUS Nivolumab + Ipilimumab + Carboplatin + Paclitaxel q42d X 1 cycle / Nivolumab + Ipilimumab q42d     07/22/2021 - 10/16/2021 Chemotherapy   Patient is on Treatment Plan : LUNG Carboplatin / Docetaxel q21d     07/22/2021 - 03/17/2022 Chemotherapy   Patient is on Treatment Plan : LUNG Nivolumab q21d     07/22/2021 -  Chemotherapy   Patient is on Treatment Plan : LUNG Nivolumab (240) q14d/Nivolumab 480 mg q28d      CNS Oncologic History 06/12/21: SRS x3 [facility])  Interval History: HARRIET BOLLEN presents today for follow up after recent MRI brain.  Denies new or progressive neurologic deficits.  Denies speech issues.  Remains off decadron.  Continues on opdivo q4 weeks with Dr. Myna Hidalgo.    H+P (02/08/22) Patient presents today for follow up after recent neurologic symptoms.  He describes numbness and weakness of his left arm and leg, progressive over several weeks.  He began dragging the left leg, interfering with ambulation.  Also describes  issues with cognition, short term memory.  Morning headaches and blurry vision have accompanied these changes.  Decadron was started late last week, this has led to significantly improved symptom burden.  He feels close to his baseline at this time.  Continues on opdivo with Dr. Myna Hidalgo.    Medications: Current Outpatient Medications on File Prior to Visit  Medication Sig Dispense Refill   amLODipine (NORVASC) 5 MG tablet Take 5 mg by mouth at bedtime.     clonazePAM (KLONOPIN) 1 MG tablet Take 1 mg by mouth 2 (two) times daily as needed for anxiety (May take additional 0.5mg  as needed for panic attack).     pantoprazole (PROTONIX) 40 MG tablet TAKE 1 TABLET (40 MG TOTAL) BY MOUTH TWICE A DAY BEFORE MEALS (Patient taking differently: Take 40 mg by mouth 2 (two) times daily before a meal.) 60 tablet 3   potassium chloride SA (KLOR-CON M) 20 MEQ tablet Take 1 tablet (20 mEq total) by mouth daily. 30 tablet 2   psyllium (METAMUCIL) 58.6 % packet Take 1 packet by mouth daily.     QUEtiapine (SEROQUEL XR) 400 MG 24 hr tablet Take 400 mg by mouth at bedtime.     sildenafil (REVATIO) 20 MG tablet Take 20-100 mg by mouth daily as needed.     solifenacin (VESICARE) 10 MG tablet Take 10 mg by mouth daily.     traZODone (DESYREL) 100 MG tablet Take 300 mg by mouth at bedtime as needed for sleep.     venlafaxine (EFFEXOR)  75 MG tablet Take 150 mg by mouth at bedtime.     [DISCONTINUED] prochlorperazine (COMPAZINE) 10 MG tablet Take 1 tablet (10 mg total) by mouth every 6 (six) hours as needed (Nausea or vomiting). 30 tablet 1   No current facility-administered medications on file prior to visit.    Allergies:  Allergies  Allergen Reactions   Paclitaxel Anaphylaxis   Penicillins Other (See Comments)    Unknown Reaction when he was an infant.   Past Medical History:  Past Medical History:  Diagnosis Date   Anxiety    Bipolar 1 disorder (HCC)    Goals of care, counseling/discussion 05/27/2021    Hypertension    Lung cancer, primary, with metastasis from lung to other site, right (HCC) 05/27/2021   Mood disorder (HCC)    Sleep apnea    Past Surgical History:  Past Surgical History:  Procedure Laterality Date   BRONCHIAL BIOPSY  05/22/2021   Procedure: BRONCHIAL BIOPSIES;  Surgeon: Josephine Igo, DO;  Location: [facility];  Service: Pulmonary;;   BRONCHIAL BRUSHINGS  05/22/2021   Procedure: BRONCHIAL BRUSHINGS;  Surgeon: Josephine Igo, DO;  Location: [facility];  Service: Pulmonary;;   BRONCHIAL NEEDLE ASPIRATION BIOPSY  05/22/2021   Procedure: BRONCHIAL NEEDLE ASPIRATION BIOPSIES;  Surgeon: Josephine Igo, DO;  Location: [facility];  Service: Pulmonary;;   IR IMAGING GUIDED PORT INSERTION  06/24/2021   VIDEO BRONCHOSCOPY WITH ENDOBRONCHIAL NAVIGATION N/A 05/22/2021   Procedure: VIDEO BRONCHOSCOPY WITH ENDOBRONCHIAL NAVIGATION;  Surgeon: Josephine Igo, DO;  Location: [facility];  Service: Pulmonary;  Laterality: N/A;   VIDEO BRONCHOSCOPY WITH RADIAL ENDOBRONCHIAL ULTRASOUND  05/22/2021   Procedure: RADIAL ENDOBRONCHIAL ULTRASOUND;  Surgeon: Josephine Igo, DO;  Location: [facility];  Service: Pulmonary;;   Social History:  Social History   Socioeconomic History   Marital status: Single    Spouse name: Not on file   Number of children: Not on file   Years of education: Not on file   Highest education level: Not on file  Occupational History   Not on file  Tobacco Use   Smoking status: Former    Current packs/day: 0.00    Average packs/day: 1.5 packs/day for 30.0 years (45.0 ttl pk-yrs)    Types: Cigarettes, E-cigarettes    Start date: 05/16/1988    Quit date: 05/16/2018    Years since quitting: 5.3   Smokeless tobacco: Never  Vaping Use   Vaping status: Never Used  Substance and Sexual Activity   Alcohol use: Never   Drug use: Never   Sexual activity: Not on file  Other Topics Concern   Not on file  Social History Narrative   Not on file    Social Drivers of Health   Financial Resource Strain: Low Risk  (09/22/2022)   Overall Financial Resource Strain (CARDIA)    Difficulty of Paying Living Expenses: Not hard at all  Food Insecurity: No Food Insecurity (09/22/2022)   Hunger Vital Sign    Worried About Running Out of Food in the Last Year: Never true    Ran Out of Food in the Last Year: Never true  Transportation Needs: No Transportation Needs (09/22/2022)   PRAPARE - Administrator, Civil Service (Medical): No    Lack of Transportation (Non-Medical): No  Physical Activity: Not on file  Stress: Not on file  Social Connections: Not on file  Intimate Partner Violence: Not At Risk (05/28/2021)   Humiliation, Afraid, Rape, and Kick questionnaire  Fear of Current or Ex-Partner: No    Emotionally Abused: No    Physically Abused: No    Sexually Abused: No   Family History:  Family History  Problem Relation Age of Onset   Breast cancer Mother    Lung cancer Father     Review of Systems: Constitutional: Doesn't report fevers, chills or abnormal weight loss Eyes: Doesn't report blurriness of vision Ears, nose, mouth, throat, and face: Doesn't report sore throat Respiratory: Doesn't report cough, dyspnea or wheezes Cardiovascular: Doesn't report palpitation, chest discomfort  Gastrointestinal:  Doesn't report nausea, constipation, diarrhea GU: Doesn't report incontinence Skin: Doesn't report skin rashes Neurological: Per HPI Musculoskeletal: Doesn't report joint pain Behavioral/Psych: Doesn't report anxiety  Physical Exam: There were no vitals filed for this visit.   KPS: 90. General: Alert, cooperative, pleasant, in no acute distress Head: Normal EENT: No conjunctival injection or scleral icterus.  Lungs: Resp effort normal Cardiac: Regular rate Abdomen: Non-distended abdomen Skin: No rashes cyanosis or petechiae. Extremities: No clubbing or edema  Neurologic Exam: Mental Status: Awake, alert,  attentive to examiner. Oriented to self and environment. Language is fluent with intact comprehension.  Cranial Nerves: Visual acuity is grossly normal. Visual fields are full. Extra-ocular movements intact. No ptosis. Face is symmetric Motor: Tone and bulk are normal. Power is 5/5 in left arm and leg. Reflexes are symmetric, no pathologic reflexes present.  Sensory: Intact to light touch Gait: Independent   Labs: I have reviewed the data as listed    Component Value Date/Time   NA 141 09/30/2023 0728   K 3.1 (L) 09/30/2023 0728   CL 103 09/30/2023 0728   CO2 29 09/30/2023 0728   GLUCOSE 93 09/30/2023 0728   BUN 13 09/30/2023 0728   CREATININE 1.23 09/30/2023 0728   CALCIUM 8.9 09/30/2023 0728   PROT 7.0 09/30/2023 0728   ALBUMIN 4.3 09/30/2023 0728   AST 16 09/30/2023 0728   ALT 19 09/30/2023 0728   ALKPHOS 80 09/30/2023 0728   BILITOT 0.4 09/30/2023 0728   GFRNONAA >60 09/30/2023 0728   Lab Results  Component Value Date   WBC 7.5 09/30/2023   NEUTROABS 5.3 09/30/2023   HGB 14.6 09/30/2023   HCT 42.1 09/30/2023   MCV 92.1 09/30/2023   PLT 170 09/30/2023    Imaging:  [facility] Clinician Interpretation: I have personally reviewed the CNS images as listed.  My interpretation, in the context of the patient's clinical presentation, is stable disease pending official read  MR BRAIN W WO CONTRAST Result Date: 09/29/2023 CLINICAL DATA:  Brain/CNS neoplasm, assess treatment response. History of non-small cell lung cancer with brain metastases treated with SRS. EXAM: MRI HEAD WITHOUT AND WITH CONTRAST TECHNIQUE: Multiplanar, multiecho pulse sequences of the brain and surrounding structures were obtained without and with intravenous contrast. CONTRAST:  10 mL of Vueway COMPARISON:  Head MRI 03/24/2023 FINDINGS: Brain: New lesions: None. Larger lesions: None. Stable or smaller lesions: 1. 11 x 9 mm enhancing lesion in the high posterior right frontal lobe, decreased in size from prior  (series 13, image 143). 2. 22 x 18 mm enhancing lesion in the left parietal lobe, decreased in size from prior with decreased, moderate residual edema (series 13, image 103). 3. 13 x 12 mm enhancing lesion in the anterior left frontal lobe, decreased in size from prior with decreased, mild residual edema (series 13, image 102). Other brain findings: There are chronic blood products associated with the 3 previously treated metastases. No acute infarct, midline shift,  or extra-axial fluid collection is identified. There is mild-to-moderate cerebral atrophy. Vascular: Major intracranial vascular flow voids are preserved. Skull and upper cervical spine: Unchanged 1.5 cm enhancing skull lesion at the right frontoparietal vertex. Sinuses/Orbits: Unremarkable orbits. Mild mucosal thickening in the left maxillary sinus. Clear mastoid air cells. Other: None. IMPRESSION: 1. Decreased size of all 3 treated brain metastases with decreased edema. 2. No new brain metastases identified. Electronically Signed   By: Sebastian Ache M.D.   On: 09/29/2023 15:14     Assessment/Plan Non-small cell lung cancer metastatic to brain [facility])  Edwyna Shell is clinically stable today.  MRI brain demonstrates overall stable findings without post-RT treatment effect.  Decadron should remain off, if tolerated.  Dr. Myna Hidalgo will con't to follow.  We ask that Edwyna Shell return to clinic in 6 months with MRI brain, or sooner as needed.  All questions were answered. The patient knows to call the clinic with any problems, questions or concerns. No barriers to learning were detected.  The total time spent in the encounter was 30 minutes and more than 50% was on counseling and review of test results   Henreitta Leber, MD Medical Director of Neuro-Oncology [facility] 10/04/23 10:04 AM

## 2023-10-05 ENCOUNTER — Other Ambulatory Visit: Payer: Self-pay

## 2023-10-06 ENCOUNTER — Other Ambulatory Visit: Payer: Self-pay | Admitting: Radiation Therapy

## 2023-10-28 ENCOUNTER — Inpatient Hospital Stay: Payer: BC Managed Care – PPO | Admitting: Hematology & Oncology

## 2023-10-28 ENCOUNTER — Inpatient Hospital Stay: Payer: BC Managed Care – PPO | Attending: Internal Medicine

## 2023-10-28 ENCOUNTER — Inpatient Hospital Stay: Payer: BC Managed Care – PPO

## 2023-10-28 ENCOUNTER — Other Ambulatory Visit: Payer: Self-pay

## 2023-10-28 ENCOUNTER — Encounter: Payer: Self-pay | Admitting: Hematology & Oncology

## 2023-10-28 VITALS — BP 160/87 | HR 63 | Resp 17

## 2023-10-28 VITALS — BP 141/93 | HR 65 | Temp 98.3°F | Resp 17 | Ht 71.0 in | Wt 285.0 lb

## 2023-10-28 DIAGNOSIS — C3491 Malignant neoplasm of unspecified part of right bronchus or lung: Secondary | ICD-10-CM

## 2023-10-28 DIAGNOSIS — C3411 Malignant neoplasm of upper lobe, right bronchus or lung: Secondary | ICD-10-CM | POA: Insufficient documentation

## 2023-10-28 DIAGNOSIS — C7931 Secondary malignant neoplasm of brain: Secondary | ICD-10-CM | POA: Insufficient documentation

## 2023-10-28 DIAGNOSIS — Z79620 Long term (current) use of immunosuppressive biologic: Secondary | ICD-10-CM | POA: Diagnosis not present

## 2023-10-28 DIAGNOSIS — Z5112 Encounter for antineoplastic immunotherapy: Secondary | ICD-10-CM | POA: Diagnosis present

## 2023-10-28 LAB — CBC WITH DIFFERENTIAL (CANCER CENTER ONLY)
Abs Immature Granulocytes: 0.04 10*3/uL (ref 0.00–0.07)
Basophils Absolute: 0.1 10*3/uL (ref 0.0–0.1)
Basophils Relative: 1 %
Eosinophils Absolute: 0.1 10*3/uL (ref 0.0–0.5)
Eosinophils Relative: 2 %
HCT: 43.5 % (ref 39.0–52.0)
Hemoglobin: 15.1 g/dL (ref 13.0–17.0)
Immature Granulocytes: 1 %
Lymphocytes Relative: 17 %
Lymphs Abs: 1.4 10*3/uL (ref 0.7–4.0)
MCH: 32.1 pg (ref 26.0–34.0)
MCHC: 34.7 g/dL (ref 30.0–36.0)
MCV: 92.6 fL (ref 80.0–100.0)
Monocytes Absolute: 0.7 10*3/uL (ref 0.1–1.0)
Monocytes Relative: 9 %
Neutro Abs: 6 10*3/uL (ref 1.7–7.7)
Neutrophils Relative %: 70 %
Platelet Count: 170 10*3/uL (ref 150–400)
RBC: 4.7 MIL/uL (ref 4.22–5.81)
RDW: 14.2 % (ref 11.5–15.5)
WBC Count: 8.4 10*3/uL (ref 4.0–10.5)
nRBC: 0 % (ref 0.0–0.2)

## 2023-10-28 LAB — CMP (CANCER CENTER ONLY)
ALT: 17 U/L (ref 0–44)
AST: 15 U/L (ref 15–41)
Albumin: 4.5 g/dL (ref 3.5–5.0)
Alkaline Phosphatase: 81 U/L (ref 38–126)
Anion gap: 8 (ref 5–15)
BUN: 13 mg/dL (ref 6–20)
CO2: 28 mmol/L (ref 22–32)
Calcium: 8.7 mg/dL — ABNORMAL LOW (ref 8.9–10.3)
Chloride: 103 mmol/L (ref 98–111)
Creatinine: 1.2 mg/dL (ref 0.61–1.24)
GFR, Estimated: >60 mL/min (ref >60)
Glucose, Bld: 75 mg/dL (ref 70–99)
Potassium: 3.4 mmol/L — ABNORMAL LOW (ref 3.5–5.1)
Sodium: 139 mmol/L (ref 135–145)
Total Bilirubin: 0.4 mg/dL (ref 0.0–1.2)
Total Protein: 7.4 g/dL (ref 6.5–8.1)

## 2023-10-28 LAB — TSH: TSH: 2.112 u[IU]/mL (ref 0.350–4.500)

## 2023-10-28 MED ORDER — SODIUM CHLORIDE 0.9 % IV SOLN: Freq: Once | INTRAVENOUS | Status: AC

## 2023-10-28 MED ORDER — SODIUM CHLORIDE 0.9 % IV SOLN
480.0000 mg | Freq: Once | INTRAVENOUS | Status: AC
  Administered 2023-10-28: 480 mg via INTRAVENOUS
  Filled 2023-10-28: qty 48

## 2023-10-28 MED ORDER — HEPARIN SOD (PORK) LOCK FLUSH 100 UNIT/ML IV SOLN
500.0000 [IU] | Freq: Once | INTRAVENOUS | Status: AC | PRN
  Administered 2023-10-28: 500 [IU]

## 2023-10-28 MED ORDER — SODIUM CHLORIDE 0.9% FLUSH
10.0000 mL | INTRAVENOUS | Status: DC | PRN
  Administered 2023-10-28: 10 mL

## 2023-10-28 NOTE — Patient Instructions (Signed)
 [facility] - A DEPT OF [facility]  Discharge Instructions: Thank you for choosing [facility] to provide your oncology and hematology care.   If you have a lab appointment with the Cancer Center, please go directly to the Cancer Center and check in at the registration area.  Wear comfortable clothing and clothing appropriate for easy access to any Portacath or PICC line.   We strive to give you quality time with your provider. You may need to reschedule your appointment if you arrive late (15 or more minutes).  Arriving late affects you and other patients whose appointments are after yours.  Also, if you miss three or more appointments without notifying the office, you may be dismissed from the clinic at the provider's discretion.      For prescription refill requests, have your pharmacy contact our office and allow 72 hours for refills to be completed.    Today you received the following chemotherapy and/or immunotherapy agents nivolumab      To help prevent nausea and vomiting after your treatment, we encourage you to take your nausea medication as directed.  BELOW ARE SYMPTOMS THAT SHOULD BE REPORTED IMMEDIATELY: *FEVER GREATER THAN 100.4 F (38 C) OR HIGHER *CHILLS OR SWEATING *NAUSEA AND VOMITING THAT IS NOT CONTROLLED WITH YOUR NAUSEA MEDICATION *UNUSUAL SHORTNESS OF BREATH *UNUSUAL BRUISING OR BLEEDING *URINARY PROBLEMS (pain or burning when urinating, or frequent urination) *BOWEL PROBLEMS (unusual diarrhea, constipation, pain near the anus) TENDERNESS IN MOUTH AND THROAT WITH OR WITHOUT PRESENCE OF ULCERS (sore throat, sores in mouth, or a toothache) UNUSUAL RASH, SWELLING OR PAIN  UNUSUAL VAGINAL DISCHARGE OR ITCHING   Items with * indicate a potential emergency and should be followed up as soon as possible or go to the Emergency Department if any problems should occur.  Please show the CHEMOTHERAPY ALERT CARD or IMMUNOTHERAPY  ALERT CARD at check-in to the Emergency Department and triage nurse. Should you have questions after your visit or need to cancel or reschedule your appointment, please contact [facility] - A DEPT OF [facility]  [phone] and follow the prompts.  Office hours are 8:00 a.m. to 4:30 p.m. Monday - Friday. Please note that voicemails left after 4:00 p.m. may not be returned until the following business day.  We are closed weekends and major holidays. You have access to a nurse at all times for urgent questions. Please call the main number to the clinic [phone] and follow the prompts.  For any non-urgent questions, you may also contact your provider using MyChart. We now offer e-Visits for anyone 69 and older to request care online for non-urgent symptoms. For details visit [email or web address].   Also download the MyChart app! Go to the app store, search "MyChart", open the app, select [facility], and log in with your MyChart username and password.

## 2023-10-28 NOTE — Patient Instructions (Signed)

## 2023-10-28 NOTE — Progress Notes (Signed)
 Hematology and Oncology Follow Up Visit  LIPA KNAUFF [id number] 1965-12-15 58 y.o. 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 Hornback is here today for follow-up and treatment.  He really looks quite good.  He is still quite busy.  He is working.  He is pretty much working full-time.  He is going have a pretty busy weekend.  He is looking forward to this.  He recently saw Dr. Barbaraann Cao of Neuro oncology.  Everything looked really good.  Dr. Barbaraann Cao will not have to see him until August of this year.  Mr. Obrecht has had no cough or shortness of breath.  He has had no nausea or vomiting.  He has had no change in bowel or bladder habits.  He has had no rashes.  He has had no bleeding.  His last TSH back in February was 1.9.  Currently, I would say that his performance status is probably ECOG 0.   Medications:  Allergies as of 10/28/2023       Reactions   Paclitaxel Anaphylaxis   Penicillins Other (See Comments)   Unknown Reaction when he was an infant.        Medication List        Accurate as of October 28, 2023  8:53 AM. If you have any questions, ask your nurse or doctor.          amLODipine 5 MG tablet Commonly known as: NORVASC Take 5 mg by mouth at bedtime.   clonazePAM 1 MG tablet Commonly known as: KLONOPIN Take 1 mg by mouth 2 (two) times daily as needed for anxiety (May take additional 0.5mg  as needed for panic attack).   pantoprazole 40 MG tablet Commonly known as: PROTONIX TAKE 1 TABLET (40 MG TOTAL) BY MOUTH TWICE A DAY  BEFORE MEALS What changed: See the new instructions.   potassium chloride SA 20 MEQ tablet Commonly known as: KLOR-CON M Take 1 tablet (20 mEq total) by mouth daily.   psyllium 58.6 % packet Commonly known as: METAMUCIL Take 1 packet by mouth daily.   QUEtiapine 400 MG 24 hr tablet Commonly known as: SEROQUEL XR Take 400 mg by mouth at bedtime.   sildenafil 20 MG tablet Commonly known as: REVATIO Take 20-100 mg by mouth daily as needed.   solifenacin 10 MG tablet Commonly known as: VESICARE Take 10 mg by mouth daily.   traZODone 100 MG tablet Commonly known as: DESYREL Take 300 mg by mouth at bedtime as needed for sleep.   venlafaxine 75 MG tablet Commonly known as: EFFEXOR Take 225 mg by mouth at bedtime.        Allergies:  Allergies  Allergen Reactions   Paclitaxel Anaphylaxis   Penicillins Other (See Comments)    Unknown Reaction when he was an infant.    Past Medical History, Surgical history, Social history, and Family History were reviewed and updated.  Review of Systems: Review of Systems  Constitutional: Negative.   HENT: Negative.    Eyes: Negative.   Respiratory: Negative.  Cardiovascular: Negative.   Gastrointestinal: Negative.   Genitourinary: Negative.   Musculoskeletal: Negative.   Skin: Negative.   Neurological:  Positive for tingling.  Endo/Heme/Allergies: Negative.   Psychiatric/Behavioral: Negative.        Physical Exam: Vital signs are temperature of 98.3.  Pulse 65.  Blood pressure 141/93.  Weight is 285 pounds.      Wt Readings from Last 3 Encounters:  10/28/23 285 lb (129.3 kg)  10/04/23 287 lb 4.8 oz (130.3 kg)  09/30/23 282 lb 15.2 oz (128.3 kg)    Physical Exam Vitals reviewed.  HENT:     Head: Normocephalic and atraumatic.  Eyes:     Pupils: Pupils are equal, round, and reactive to light.  Cardiovascular:     Rate and Rhythm: Normal rate and regular rhythm.     Heart sounds: Normal heart sounds.      Comments: Cardiac exam is markedly tachycardic but regular.  He has no murmurs, rubs or bruits. Pulmonary:     Effort: Pulmonary effort is normal.     Breath sounds: Normal breath sounds.     Comments: His lungs sound clear bilaterally.  I do not hear any wheezing.  There is no rhonchi. Abdominal:     General: Bowel sounds are normal.     Palpations: Abdomen is soft.  Musculoskeletal:        General: No tenderness or deformity. Normal range of motion.     Cervical back: Normal range of motion.  Lymphadenopathy:     Cervical: No cervical adenopathy.  Skin:    General: Skin is warm and dry.     Findings: No erythema or rash.  Neurological:     Mental Status: He is alert and oriented to person, place, and time.  Psychiatric:        Behavior: Behavior normal.        Thought Content: Thought content normal.        Judgment: Judgment normal.     Lab Results  Component Value Date   WBC 8.4 10/28/2023   HGB 15.1 10/28/2023   HCT 43.5 10/28/2023   MCV 92.6 10/28/2023   PLT 170 10/28/2023   No results found for: "FERRITIN", "IRON", "TIBC", "UIBC", "IRONPCTSAT" Lab Results  Component Value Date   RBC 4.70 10/28/2023   No results found for: "KPAFRELGTCHN", "LAMBDASER", "KAPLAMBRATIO" No results found for: "IGGSERUM", "IGA", "IGMSERUM" No results found for: "TOTALPROTELP", "ALBUMINELP", "A1GS", "A2GS", "BETS", "BETA2SER", "GAMS", "MSPIKE", "SPEI"   Chemistry      Component Value Date/Time   NA 139 10/28/2023 0820   K 3.4 (L) 10/28/2023 0820   CL 103 10/28/2023 0820   CO2 28 10/28/2023 0820   BUN 13 10/28/2023 0820   CREATININE 1.20 10/28/2023 0820      Component Value Date/Time   CALCIUM 8.7 (L) 10/28/2023 0820   ALKPHOS 81 10/28/2023 0820   AST 15 10/28/2023 0820   ALT 17 10/28/2023 0820   BILITOT 0.4 10/28/2023 0820       Impression and Plan: Mr. 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  He completed his chemotherapy along with immunotherapy.   He now is on maintenance therapy with nivolumab and tolerating well with a nice response.  So far, we have not seen any evidence of recurrent or progressive disease.  I will set him up with another PET scan before I see him back in April.  I am just  happy that his quality of life is doing so well right now.  I am happy that he is able to work and do what he would like to do.  We will plan to see him back in a month.  3/14/20258:53 AM

## 2023-10-29 LAB — T4: T4, Total: 6.1 ug/dL (ref 4.5–12.0)

## 2023-11-21 ENCOUNTER — Encounter: Payer: Self-pay | Admitting: Hematology & Oncology

## 2023-11-24 ENCOUNTER — Encounter (HOSPITAL_COMMUNITY)
Admission: RE | Admit: 2023-11-24 | Discharge: 2023-11-24 | Disposition: A | Discharge status: Home / Self Care | Source: Ambulatory Visit | Attending: Hematology & Oncology | Admitting: Hematology & Oncology

## 2023-11-24 ENCOUNTER — Encounter: Payer: Self-pay | Admitting: *Deleted

## 2023-11-24 DIAGNOSIS — C3491 Malignant neoplasm of unspecified part of right bronchus or lung: Secondary | ICD-10-CM | POA: Insufficient documentation

## 2023-11-24 LAB — GLUCOSE, CAPILLARY: Glucose-Capillary: 95 mg/dL (ref 70–99)

## 2023-11-24 MED ORDER — FLUDEOXYGLUCOSE F - 18 (FDG) INJECTION
14.2000 | Freq: Once | INTRAVENOUS | Status: AC
  Administered 2023-11-24: 14.2 via INTRAVENOUS

## 2023-11-25 ENCOUNTER — Inpatient Hospital Stay

## 2023-11-25 ENCOUNTER — Inpatient Hospital Stay (HOSPITAL_BASED_OUTPATIENT_CLINIC_OR_DEPARTMENT_OTHER): Admitting: Hematology & Oncology

## 2023-11-25 ENCOUNTER — Other Ambulatory Visit: Payer: Self-pay

## 2023-11-25 ENCOUNTER — Encounter: Payer: Self-pay | Admitting: Hematology & Oncology

## 2023-11-25 ENCOUNTER — Inpatient Hospital Stay: Attending: Internal Medicine

## 2023-11-25 VITALS — BP 126/84 | HR 61

## 2023-11-25 VITALS — BP 127/82 | HR 63 | Temp 97.5°F | Resp 16 | Ht 71.0 in | Wt 283.0 lb

## 2023-11-25 DIAGNOSIS — C3411 Malignant neoplasm of upper lobe, right bronchus or lung: Secondary | ICD-10-CM | POA: Insufficient documentation

## 2023-11-25 DIAGNOSIS — C3491 Malignant neoplasm of unspecified part of right bronchus or lung: Secondary | ICD-10-CM

## 2023-11-25 DIAGNOSIS — C7931 Secondary malignant neoplasm of brain: Secondary | ICD-10-CM | POA: Diagnosis not present

## 2023-11-25 DIAGNOSIS — Z5112 Encounter for antineoplastic immunotherapy: Secondary | ICD-10-CM | POA: Diagnosis present

## 2023-11-25 LAB — CBC WITH DIFFERENTIAL (CANCER CENTER ONLY)
Abs Immature Granulocytes: 0.03 10*3/uL (ref 0.00–0.07)
Basophils Absolute: 0.1 10*3/uL (ref 0.0–0.1)
Basophils Relative: 1 %
Eosinophils Absolute: 0.1 10*3/uL (ref 0.0–0.5)
Eosinophils Relative: 2 %
HCT: 43.4 % (ref 39.0–52.0)
Hemoglobin: 15.1 g/dL (ref 13.0–17.0)
Immature Granulocytes: 0 %
Lymphocytes Relative: 18 %
Lymphs Abs: 1.4 10*3/uL (ref 0.7–4.0)
MCH: 31.9 pg (ref 26.0–34.0)
MCHC: 34.8 g/dL (ref 30.0–36.0)
MCV: 91.8 fL (ref 80.0–100.0)
Monocytes Absolute: 0.5 10*3/uL (ref 0.1–1.0)
Monocytes Relative: 6 %
Neutro Abs: 5.4 10*3/uL (ref 1.7–7.7)
Neutrophils Relative %: 73 %
Platelet Count: 179 10*3/uL (ref 150–400)
RBC: 4.73 MIL/uL (ref 4.22–5.81)
RDW: 14 % (ref 11.5–15.5)
WBC Count: 7.5 10*3/uL (ref 4.0–10.5)
nRBC: 0 % (ref 0.0–0.2)

## 2023-11-25 LAB — CMP (CANCER CENTER ONLY)
ALT: 17 U/L (ref 0–44)
AST: 17 U/L (ref 15–41)
Albumin: 4.2 g/dL (ref 3.5–5.0)
Alkaline Phosphatase: 80 U/L (ref 38–126)
Anion gap: 8 (ref 5–15)
BUN: 11 mg/dL (ref 6–20)
CO2: 27 mmol/L (ref 22–32)
Calcium: 8.5 mg/dL — ABNORMAL LOW (ref 8.9–10.3)
Chloride: 105 mmol/L (ref 98–111)
Creatinine: 1.24 mg/dL (ref 0.61–1.24)
GFR, Estimated: >60 mL/min (ref >60)
Glucose, Bld: 115 mg/dL — ABNORMAL HIGH (ref 70–99)
Potassium: 3.5 mmol/L (ref 3.5–5.1)
Sodium: 140 mmol/L (ref 135–145)
Total Bilirubin: 0.4 mg/dL (ref 0.0–1.2)
Total Protein: 6.7 g/dL (ref 6.5–8.1)

## 2023-11-25 LAB — TSH: TSH: 3.558 u[IU]/mL (ref 0.350–4.500)

## 2023-11-25 MED ORDER — SODIUM CHLORIDE 0.9 % IV SOLN
480.0000 mg | Freq: Once | INTRAVENOUS | Status: AC
  Administered 2023-11-25: 480 mg via INTRAVENOUS
  Filled 2023-11-25: qty 48

## 2023-11-25 MED ORDER — SODIUM CHLORIDE 0.9 % IV SOLN: Freq: Once | INTRAVENOUS | Status: AC

## 2023-11-25 MED ORDER — HEPARIN SOD (PORK) LOCK FLUSH 100 UNIT/ML IV SOLN
500.0000 [IU] | Freq: Once | INTRAVENOUS | Status: AC | PRN
  Administered 2023-11-25: 500 [IU]

## 2023-11-25 MED ORDER — SODIUM CHLORIDE 0.9% FLUSH
10.0000 mL | INTRAVENOUS | Status: DC | PRN
  Administered 2023-11-25: 10 mL

## 2023-11-25 NOTE — Progress Notes (Signed)
 Hematology and Oncology Follow Up Visit  Kevin Hunter [id number] 26-Sep-1965 58 y.o. 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 is here today for follow-up and treatment.  Overall, he continues to do amazingly well.  He really has had no complaints.  He has had no issues with the Opdivo.  We did do a PET scan on him.  This was done on 11/24/2023.  PET scan did not show anything that looks like there was metastatic or active disease.  Nothing that looks like recurrent disease.  He does not need MRI of the brain until August for what he says.  He has had no issues with seizures.  He is still working.  He is quite busy at work..  He has had no cough or shortness of breath.  He has had no headache.  There is no visual changes.  There is no change in bowel or bladder habits.  He has had no rashes.  He has had no bleeding.    His last TSH was 2.1.  Overall, I would say that his performance status is probably ECOG 0.     Medications:  Allergies as of 11/25/2023       Reactions   Paclitaxel Anaphylaxis   Penicillins Other (See Comments)   Unknown Reaction when he was an infant.        Medication List        Accurate as of November 25, 2023  8:13 AM. If you have any questions, ask your nurse or doctor.          amLODipine 5 MG tablet Commonly known as: NORVASC Take 5 mg by mouth at bedtime.   clonazePAM 1 MG tablet Commonly known as: KLONOPIN Take 1 mg by mouth 2 (two) times daily as needed for anxiety (May take additional 0.5mg  as  needed for panic attack).   pantoprazole 40 MG tablet Commonly known as: PROTONIX TAKE 1 TABLET (40 MG TOTAL) BY MOUTH TWICE A DAY BEFORE MEALS What changed: See the new instructions.   potassium chloride SA 20 MEQ tablet Commonly known as: KLOR-CON M Take 1 tablet (20 mEq total) by mouth daily.   psyllium 58.6 % packet Commonly known as: METAMUCIL Take 1 packet by mouth daily.   QUEtiapine 400 MG 24 hr tablet Commonly known as: SEROQUEL XR Take 400 mg by mouth at bedtime.   sildenafil 20 MG tablet Commonly known as: REVATIO Take 20-100 mg by mouth daily as needed.   solifenacin 10 MG tablet Commonly known as: VESICARE Take 10 mg by mouth daily.   traZODone 100 MG tablet Commonly known as: DESYREL Take 300 mg by mouth at bedtime as needed for sleep.   venlafaxine 75 MG tablet Commonly known as: EFFEXOR Take 225 mg by mouth at bedtime.        Allergies:  Allergies  Allergen Reactions   Paclitaxel Anaphylaxis   Penicillins Other (See Comments)    Unknown Reaction when he was an infant.    Past Medical History, Surgical history,  Social history, and Family History were reviewed and updated.  Review of Systems: Review of Systems  Constitutional: Negative.   HENT: Negative.    Eyes: Negative.   Respiratory: Negative.    Cardiovascular: Negative.   Gastrointestinal: Negative.   Genitourinary: Negative.   Musculoskeletal: Negative.   Skin: Negative.   Neurological:  Positive for tingling.  Endo/Heme/Allergies: Negative.   Psychiatric/Behavioral: Negative.        Physical Exam: Vital signs are temperature of 97.5.  Pulse 63.  Blood pressure 127/82.  Weight is 283 pounds.    Wt Readings from Last 3 Encounters:  11/25/23 283 lb (128.4 kg)  10/28/23 285 lb (129.3 kg)  10/04/23 287 lb 4.8 oz (130.3 kg)    Physical Exam Vitals reviewed.  HENT:     Head: Normocephalic and atraumatic.  Eyes:     Pupils: Pupils are equal, round, and reactive to light.   Cardiovascular:     Rate and Rhythm: Normal rate and regular rhythm.     Heart sounds: Normal heart sounds.     Comments: Cardiac exam is markedly tachycardic but regular.  He has no murmurs, rubs or bruits. Pulmonary:     Effort: Pulmonary effort is normal.     Breath sounds: Normal breath sounds.     Comments: His lungs sound clear bilaterally.  I do not hear any wheezing.  There is no rhonchi. Abdominal:     General: Bowel sounds are normal.     Palpations: Abdomen is soft.  Musculoskeletal:        General: No tenderness or deformity. Normal range of motion.     Cervical back: Normal range of motion.  Lymphadenopathy:     Cervical: No cervical adenopathy.  Skin:    General: Skin is warm and dry.     Findings: No erythema or rash.  Neurological:     Mental Status: He is alert and oriented to person, place, and time.  Psychiatric:        Behavior: Behavior normal.        Thought Content: Thought content normal.        Judgment: Judgment normal.     Lab Results  Component Value Date   WBC 8.4 10/28/2023   HGB 15.1 10/28/2023   HCT 43.5 10/28/2023   MCV 92.6 10/28/2023   PLT 170 10/28/2023   No results found for: "FERRITIN", "IRON", "TIBC", "UIBC", "IRONPCTSAT" Lab Results  Component Value Date   RBC 4.70 10/28/2023   No results found for: "KPAFRELGTCHN", "LAMBDASER", "KAPLAMBRATIO" No results found for: "IGGSERUM", "IGA", "IGMSERUM" No results found for: "TOTALPROTELP", "ALBUMINELP", "A1GS", "A2GS", "BETS", "BETA2SER", "GAMS", "MSPIKE", "SPEI"   Chemistry      Component Value Date/Time   NA 139 10/28/2023 0820   K 3.4 (L) 10/28/2023 0820   CL 103 10/28/2023 0820   CO2 28 10/28/2023 0820   BUN 13 10/28/2023 0820   CREATININE 1.20 10/28/2023 0820      Component Value Date/Time   CALCIUM 8.7 (L) 10/28/2023 0820   ALKPHOS 81 10/28/2023 0820   AST 15 10/28/2023 0820   ALT 17 10/28/2023 0820   BILITOT 0.4 10/28/2023 0820       Impression and Plan: Mr.  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 He completed his chemotherapy along with immunotherapy.   He now is on maintenance therapy with nivolumab and tolerating well with a nice response.  So far, we  have not seen any evidence of recurrent or progressive disease.  I do not think we need another PET scan probably until the Summer.  I am just happy that his quality of life is doing so well right now.  He is pretty much doing what he wishes.  We will plan to get him back to see [place] in another month.   4/11/20258:13 AM

## 2023-11-25 NOTE — Patient Instructions (Signed)
 [facility] - A DEPT OF [facility]  Discharge Instructions: Thank you for choosing [facility] to provide your oncology and hematology care.   If you have a lab appointment with the Cancer Center, please go directly to the Cancer Center and check in at the registration area.  Wear comfortable clothing and clothing appropriate for easy access to any Portacath or PICC line.   We strive to give you quality time with your provider. You may need to reschedule your appointment if you arrive late (15 or more minutes).  Arriving late affects you and other patients whose appointments are after yours.  Also, if you miss three or more appointments without notifying the office, you may be dismissed from the clinic at the provider's discretion.      For prescription refill requests, have your pharmacy contact our office and allow 72 hours for refills to be completed.    Today you received the following chemotherapy and/or immunotherapy agents Nivolumab    To help prevent nausea and vomiting after your treatment, we encourage you to take your nausea medication as directed.  BELOW ARE SYMPTOMS THAT SHOULD BE REPORTED IMMEDIATELY: *FEVER GREATER THAN 100.4 F (38 C) OR HIGHER *CHILLS OR SWEATING *NAUSEA AND VOMITING THAT IS NOT CONTROLLED WITH YOUR NAUSEA MEDICATION *UNUSUAL SHORTNESS OF BREATH *UNUSUAL BRUISING OR BLEEDING *URINARY PROBLEMS (pain or burning when urinating, or frequent urination) *BOWEL PROBLEMS (unusual diarrhea, constipation, pain near the anus) TENDERNESS IN MOUTH AND THROAT WITH OR WITHOUT PRESENCE OF ULCERS (sore throat, sores in mouth, or a toothache) UNUSUAL RASH, SWELLING OR PAIN  UNUSUAL VAGINAL DISCHARGE OR ITCHING   Items with * indicate a potential emergency and should be followed up as soon as possible or go to the Emergency Department if any problems should occur.  Please show the CHEMOTHERAPY ALERT CARD or IMMUNOTHERAPY  ALERT CARD at check-in to the Emergency Department and triage nurse. Should you have questions after your visit or need to cancel or reschedule your appointment, please contact [facility] - A DEPT OF [facility]  [phone] and follow the prompts.  Office hours are 8:00 a.m. to 4:30 p.m. Monday - Friday. Please note that voicemails left after 4:00 p.m. may not be returned until the following business day.  We are closed weekends and major holidays. You have access to a nurse at all times for urgent questions. Please call the main number to the clinic [phone] and follow the prompts.  For any non-urgent questions, you may also contact your provider using MyChart. We now offer e-Visits for anyone 14 and older to request care online for non-urgent symptoms. For details visit [email or web address].   Also download the MyChart app! Go to the app store, search "MyChart", open the app, select [place], and log in with your MyChart username and password.

## 2023-11-25 NOTE — Patient Instructions (Signed)

## 2023-12-23 ENCOUNTER — Inpatient Hospital Stay

## 2023-12-23 ENCOUNTER — Ambulatory Visit: Admitting: Hematology & Oncology

## 2023-12-23 ENCOUNTER — Ambulatory Visit

## 2023-12-23 ENCOUNTER — Other Ambulatory Visit: Payer: Self-pay

## 2023-12-23 ENCOUNTER — Other Ambulatory Visit

## 2023-12-23 ENCOUNTER — Inpatient Hospital Stay (HOSPITAL_BASED_OUTPATIENT_CLINIC_OR_DEPARTMENT_OTHER): Admitting: Hematology & Oncology

## 2023-12-23 ENCOUNTER — Inpatient Hospital Stay: Attending: Internal Medicine

## 2023-12-23 VITALS — BP 122/78 | HR 63 | Temp 97.4°F | Resp 18 | Ht 71.0 in | Wt 287.0 lb

## 2023-12-23 VITALS — BP 131/85 | HR 58 | Resp 17

## 2023-12-23 DIAGNOSIS — C3411 Malignant neoplasm of upper lobe, right bronchus or lung: Secondary | ICD-10-CM | POA: Diagnosis present

## 2023-12-23 DIAGNOSIS — C7931 Secondary malignant neoplasm of brain: Secondary | ICD-10-CM | POA: Insufficient documentation

## 2023-12-23 DIAGNOSIS — C3491 Malignant neoplasm of unspecified part of right bronchus or lung: Secondary | ICD-10-CM

## 2023-12-23 DIAGNOSIS — Z79620 Long term (current) use of immunosuppressive biologic: Secondary | ICD-10-CM | POA: Insufficient documentation

## 2023-12-23 DIAGNOSIS — Z5112 Encounter for antineoplastic immunotherapy: Secondary | ICD-10-CM | POA: Diagnosis present

## 2023-12-23 LAB — CMP (CANCER CENTER ONLY)
ALT: 17 U/L (ref 0–44)
AST: 14 U/L — ABNORMAL LOW (ref 15–41)
Albumin: 4.3 g/dL (ref 3.5–5.0)
Alkaline Phosphatase: 73 U/L (ref 38–126)
Anion gap: 7 (ref 5–15)
BUN: 12 mg/dL (ref 6–20)
CO2: 28 mmol/L (ref 22–32)
Calcium: 8.8 mg/dL — ABNORMAL LOW (ref 8.9–10.3)
Chloride: 104 mmol/L (ref 98–111)
Creatinine: 1.19 mg/dL (ref 0.61–1.24)
GFR, Estimated: >60 mL/min (ref >60)
Glucose, Bld: 98 mg/dL (ref 70–99)
Potassium: 3.7 mmol/L (ref 3.5–5.1)
Sodium: 139 mmol/L (ref 135–145)
Total Bilirubin: 0.3 mg/dL (ref 0.0–1.2)
Total Protein: 7 g/dL (ref 6.5–8.1)

## 2023-12-23 LAB — CBC WITH DIFFERENTIAL (CANCER CENTER ONLY)
Abs Immature Granulocytes: 0.03 10*3/uL (ref 0.00–0.07)
Basophils Absolute: 0.1 10*3/uL (ref 0.0–0.1)
Basophils Relative: 1 %
Eosinophils Absolute: 0.2 10*3/uL (ref 0.0–0.5)
Eosinophils Relative: 2 %
HCT: 42.7 % (ref 39.0–52.0)
Hemoglobin: 14.6 g/dL (ref 13.0–17.0)
Immature Granulocytes: 0 %
Lymphocytes Relative: 19 %
Lymphs Abs: 1.3 10*3/uL (ref 0.7–4.0)
MCH: 31.6 pg (ref 26.0–34.0)
MCHC: 34.2 g/dL (ref 30.0–36.0)
MCV: 92.4 fL (ref 80.0–100.0)
Monocytes Absolute: 0.7 10*3/uL (ref 0.1–1.0)
Monocytes Relative: 10 %
Neutro Abs: 4.5 10*3/uL (ref 1.7–7.7)
Neutrophils Relative %: 68 %
Platelet Count: 160 10*3/uL (ref 150–400)
RBC: 4.62 MIL/uL (ref 4.22–5.81)
RDW: 14.4 % (ref 11.5–15.5)
WBC Count: 6.8 10*3/uL (ref 4.0–10.5)
nRBC: 0 % (ref 0.0–0.2)

## 2023-12-23 LAB — TSH: TSH: 5.52 u[IU]/mL — ABNORMAL HIGH (ref 0.350–4.500)

## 2023-12-23 MED ORDER — SODIUM CHLORIDE 0.9 % IV SOLN: Freq: Once | INTRAVENOUS | Status: AC

## 2023-12-23 MED ORDER — HEPARIN SOD (PORK) LOCK FLUSH 100 UNIT/ML IV SOLN
500.0000 [IU] | Freq: Once | INTRAVENOUS | Status: AC | PRN
Start: 2023-12-23 — End: 2023-12-23
  Administered 2023-12-23: 500 [IU]

## 2023-12-23 MED ORDER — SODIUM CHLORIDE 0.9 % IV SOLN
480.0000 mg | Freq: Once | INTRAVENOUS | Status: AC
  Administered 2023-12-23: 480 mg via INTRAVENOUS
  Filled 2023-12-23: qty 48

## 2023-12-23 MED ORDER — SODIUM CHLORIDE 0.9% FLUSH
10.0000 mL | INTRAVENOUS | Status: DC | PRN
  Administered 2023-12-23: 10 mL

## 2023-12-23 NOTE — Patient Instructions (Signed)

## 2023-12-23 NOTE — Patient Instructions (Signed)
 [facility] - A DEPT OF [facility]  Discharge Instructions: Thank you for choosing [facility] to provide your oncology and hematology care.   If you have a lab appointment with the Cancer Center, please go directly to the Cancer Center and check in at the registration area.  Wear comfortable clothing and clothing appropriate for easy access to any Portacath or PICC line.   We strive to give you quality time with your provider. You may need to reschedule your appointment if you arrive late (15 or more minutes).  Arriving late affects you and other patients whose appointments are after yours.  Also, if you miss three or more appointments without notifying the office, you may be dismissed from the clinic at the provider's discretion.      For prescription refill requests, have your pharmacy contact our office and allow 72 hours for refills to be completed.    Today you received the following chemotherapy and/or immunotherapy agents Nivolumab    To help prevent nausea and vomiting after your treatment, we encourage you to take your nausea medication as directed.  BELOW ARE SYMPTOMS THAT SHOULD BE REPORTED IMMEDIATELY: *FEVER GREATER THAN 100.4 F (38 C) OR HIGHER *CHILLS OR SWEATING *NAUSEA AND VOMITING THAT IS NOT CONTROLLED WITH YOUR NAUSEA MEDICATION *UNUSUAL SHORTNESS OF BREATH *UNUSUAL BRUISING OR BLEEDING *URINARY PROBLEMS (pain or burning when urinating, or frequent urination) *BOWEL PROBLEMS (unusual diarrhea, constipation, pain near the anus) TENDERNESS IN MOUTH AND THROAT WITH OR WITHOUT PRESENCE OF ULCERS (sore throat, sores in mouth, or a toothache) UNUSUAL RASH, SWELLING OR PAIN  UNUSUAL VAGINAL DISCHARGE OR ITCHING   Items with * indicate a potential emergency and should be followed up as soon as possible or go to the Emergency Department if any problems should occur.  Please show the CHEMOTHERAPY ALERT CARD or IMMUNOTHERAPY  ALERT CARD at check-in to the Emergency Department and triage nurse. Should you have questions after your visit or need to cancel or reschedule your appointment, please contact [facility] - A DEPT OF [facility]  [phone] and follow the prompts.  Office hours are 8:00 a.m. to 4:30 p.m. Monday - Friday. Please note that voicemails left after 4:00 p.m. may not be returned until the following business day.  We are closed weekends and major holidays. You have access to a nurse at all times for urgent questions. Please call the main number to the clinic [phone] and follow the prompts.  For any non-urgent questions, you may also contact your provider using MyChart. We now offer e-Visits for anyone 14 and older to request care online for non-urgent symptoms. For details visit [email or web address].   Also download the MyChart app! Go to the app store, search "MyChart", open the app, select [place], and log in with your MyChart username and password.

## 2023-12-23 NOTE — Progress Notes (Signed)
 Hematology and Oncology Follow Up Visit  Kevin Hunter [id number] [birth year] 58 y.o. 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  Overall, he continues to do amazingly well.  He really has had no complaints.  He has had no issues with the Opdivo .  He is still busy at work.  He works overtime.  He was up in [place] .  He had a nice time up in [place] .  He has had no problems with cough.  He has had no diarrhea.  He has had no nausea or vomiting.  There is been no headache.  He has had no sores.  His last TSH was 3.56.  Overall, I would say that his performance status is ECOG 0.    Medications:  Allergies as of 12/23/2023       Reactions   Paclitaxel  Anaphylaxis   Penicillins Other (See Comments)   Unknown Reaction when he was an infant.        Medication List        Accurate as of Dec 23, 2023  8:18 AM. If you have any questions, ask your nurse or doctor.          amLODipine  5 MG tablet Commonly known as: NORVASC  Take 5 mg by mouth at bedtime.   clonazePAM  1 MG tablet Commonly known as: KLONOPIN  Take 1 mg by mouth 2 (two) times daily as needed for anxiety (May take additional 0.5mg  as needed for panic attack).   pantoprazole  40 MG tablet Commonly known as: PROTONIX  TAKE 1 TABLET (40 MG TOTAL) BY MOUTH TWICE A DAY BEFORE MEALS What changed: See the new instructions.   potassium chloride  SA 20 MEQ tablet Commonly known as: KLOR-CON  M Take 1 tablet (20 mEq total) by mouth daily.    psyllium 58.6 % packet Commonly known as: METAMUCIL Take 1 packet by mouth daily.   QUEtiapine  400 MG 24 hr tablet Commonly known as: SEROQUEL  XR Take 400 mg by mouth at bedtime.   sildenafil 20 MG tablet Commonly known as: REVATIO Take 20-100 mg by mouth daily as needed.   solifenacin 10 MG tablet Commonly known as: VESICARE Take 10 mg by mouth daily.   traZODone  100 MG tablet Commonly known as: DESYREL  Take 300 mg by mouth at bedtime as needed for sleep.   venlafaxine  75 MG tablet Commonly known as: EFFEXOR  Take 225 mg by mouth at bedtime.        Allergies:  Allergies  Allergen Reactions   Paclitaxel  Anaphylaxis   Penicillins Other (See Comments)    Unknown Reaction when he was an infant.    Past Medical History, Surgical history, Social history, and Family History were reviewed and updated.  Review of Systems: Review of Systems  Constitutional: Negative.   HENT: Negative.    Eyes: Negative.   Respiratory: Negative.    Cardiovascular: Negative.   Gastrointestinal: Negative.   Genitourinary: Negative.   Musculoskeletal: Negative.   Skin: Negative.   Neurological:  Positive for tingling.  Endo/Heme/Allergies: Negative.   Psychiatric/Behavioral: Negative.        Physical Exam: Vital signs are a temperature 97.4.  Pulse 63.  Blood pressure 122/78.  Weight is 287 pounds.     Wt Readings from Last 3 Encounters:  11/25/23 283 lb (128.4 kg)  10/28/23 285 lb (129.3 kg)  10/04/23 287 lb 4.8 oz (130.3 kg)    Physical Exam Vitals reviewed.  HENT:     Head: Normocephalic and atraumatic.  Eyes:     Pupils: Pupils are equal, round, and reactive to light.  Cardiovascular:     Rate and Rhythm: Normal rate and regular rhythm.     Heart sounds: Normal heart sounds.     Comments: Cardiac exam is markedly tachycardic but regular.  He has no murmurs, rubs or bruits. Pulmonary:     Effort: Pulmonary effort is normal.     Breath sounds: Normal breath sounds.      Comments: His lungs sound clear bilaterally.  I do not hear any wheezing.  There is no rhonchi. Abdominal:     General: Bowel sounds are normal.     Palpations: Abdomen is soft.  Musculoskeletal:        General: No tenderness or deformity. Normal range of motion.     Cervical back: Normal range of motion.  Lymphadenopathy:     Cervical: No cervical adenopathy.  Skin:    General: Skin is warm and dry.     Findings: No erythema or rash.  Neurological:     Mental Status: He is alert and oriented to person, place, and time.  Psychiatric:        Behavior: Behavior normal.        Thought Content: Thought content normal.        Judgment: Judgment normal.     Lab Results  Component Value Date   WBC 7.5 11/25/2023   HGB 15.1 11/25/2023   HCT 43.4 11/25/2023   MCV 91.8 11/25/2023   PLT 179 11/25/2023   No results found for: "FERRITIN", "IRON", "TIBC", "UIBC", "IRONPCTSAT" Lab Results  Component Value Date   RBC 4.73 11/25/2023   No results found for: "KPAFRELGTCHN", "LAMBDASER", "KAPLAMBRATIO" No results found for: "IGGSERUM", "IGA", "IGMSERUM" No results found for: "TOTALPROTELP", "ALBUMINELP", "A1GS", "A2GS", "BETS", "BETA2SER", "GAMS", "MSPIKE", "SPEI"   Chemistry      Component Value Date/Time   NA 140 11/25/2023 0810   K 3.5 11/25/2023 0810   CL 105 11/25/2023 0810   CO2 27 11/25/2023 0810   BUN 11 11/25/2023 0810   CREATININE 1.24 11/25/2023 0810      Component Value Date/Time   CALCIUM 8.5 (L) 11/25/2023 0810   ALKPHOS 80 11/25/2023 0810   AST 17 11/25/2023 0810   ALT 17 11/25/2023 0810   BILITOT 0.4 11/25/2023 0810       Impression and Plan: Kevin Hunter is a very pleasant 58 yo caucasian gentleman with 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 He completed his chemotherapy along with immunotherapy.   He now is on maintenance therapy with nivolumab  and tolerating well with a nice response.  So far, we have not seen any evidence of  recurrent or progressive disease.  I do not think we need another PET scan probably until the Summer.  I am just happy that his quality of life is doing so well right now.  He is pretty much doing what he wishes.  We will plan to get him back to see us  in  another month.   5/9/20258:18 AM

## 2023-12-24 LAB — T4: T4, Total: 4.4 ug/dL — ABNORMAL LOW (ref 4.5–12.0)

## 2023-12-27 ENCOUNTER — Other Ambulatory Visit: Payer: Self-pay | Admitting: Hematology & Oncology

## 2024-01-20 ENCOUNTER — Inpatient Hospital Stay: Attending: Internal Medicine

## 2024-01-20 ENCOUNTER — Inpatient Hospital Stay

## 2024-01-20 ENCOUNTER — Other Ambulatory Visit: Payer: Self-pay

## 2024-01-20 ENCOUNTER — Ambulatory Visit: Payer: Self-pay | Admitting: Hematology & Oncology

## 2024-01-20 ENCOUNTER — Inpatient Hospital Stay: Admitting: Family

## 2024-01-20 VITALS — BP 134/82 | HR 66 | Resp 18

## 2024-01-20 VITALS — BP 137/90 | HR 72 | Temp 97.9°F | Resp 16 | Wt 289.6 lb

## 2024-01-20 DIAGNOSIS — C3411 Malignant neoplasm of upper lobe, right bronchus or lung: Secondary | ICD-10-CM | POA: Insufficient documentation

## 2024-01-20 DIAGNOSIS — C3491 Malignant neoplasm of unspecified part of right bronchus or lung: Secondary | ICD-10-CM

## 2024-01-20 DIAGNOSIS — C7931 Secondary malignant neoplasm of brain: Secondary | ICD-10-CM

## 2024-01-20 DIAGNOSIS — Z5112 Encounter for antineoplastic immunotherapy: Secondary | ICD-10-CM | POA: Insufficient documentation

## 2024-01-20 DIAGNOSIS — Z79620 Long term (current) use of immunosuppressive biologic: Secondary | ICD-10-CM | POA: Insufficient documentation

## 2024-01-20 DIAGNOSIS — E032 Hypothyroidism due to medicaments and other exogenous substances: Secondary | ICD-10-CM | POA: Diagnosis not present

## 2024-01-20 DIAGNOSIS — C3490 Malignant neoplasm of unspecified part of unspecified bronchus or lung: Secondary | ICD-10-CM | POA: Diagnosis not present

## 2024-01-20 DIAGNOSIS — C7970 Secondary malignant neoplasm of unspecified adrenal gland: Secondary | ICD-10-CM | POA: Insufficient documentation

## 2024-01-20 LAB — CMP (CANCER CENTER ONLY)
ALT: 18 U/L (ref 0–44)
AST: 16 U/L (ref 15–41)
Albumin: 4.6 g/dL (ref 3.5–5.0)
Alkaline Phosphatase: 69 U/L (ref 38–126)
Anion gap: 9 (ref 5–15)
BUN: 14 mg/dL (ref 6–20)
CO2: 27 mmol/L (ref 22–32)
Calcium: 9 mg/dL (ref 8.9–10.3)
Chloride: 102 mmol/L (ref 98–111)
Creatinine: 1.2 mg/dL (ref 0.61–1.24)
GFR, Estimated: >60 mL/min (ref >60)
Glucose, Bld: 88 mg/dL (ref 70–99)
Potassium: 3.4 mmol/L — ABNORMAL LOW (ref 3.5–5.1)
Sodium: 138 mmol/L (ref 135–145)
Total Bilirubin: 0.4 mg/dL (ref 0.0–1.2)
Total Protein: 7.5 g/dL (ref 6.5–8.1)

## 2024-01-20 LAB — CBC WITH DIFFERENTIAL (CANCER CENTER ONLY)
Abs Immature Granulocytes: 0.07 10*3/uL (ref 0.00–0.07)
Basophils Absolute: 0.1 10*3/uL (ref 0.0–0.1)
Basophils Relative: 1 %
Eosinophils Absolute: 0.2 10*3/uL (ref 0.0–0.5)
Eosinophils Relative: 2 %
HCT: 43.3 % (ref 39.0–52.0)
Hemoglobin: 15 g/dL (ref 13.0–17.0)
Immature Granulocytes: 1 %
Lymphocytes Relative: 18 %
Lymphs Abs: 1.5 10*3/uL (ref 0.7–4.0)
MCH: 31.7 pg (ref 26.0–34.0)
MCHC: 34.6 g/dL (ref 30.0–36.0)
MCV: 91.5 fL (ref 80.0–100.0)
Monocytes Absolute: 0.8 10*3/uL (ref 0.1–1.0)
Monocytes Relative: 9 %
Neutro Abs: 5.9 10*3/uL (ref 1.7–7.7)
Neutrophils Relative %: 69 %
Platelet Count: 183 10*3/uL (ref 150–400)
RBC: 4.73 MIL/uL (ref 4.22–5.81)
RDW: 14.5 % (ref 11.5–15.5)
WBC Count: 8.5 10*3/uL (ref 4.0–10.5)
nRBC: 0 % (ref 0.0–0.2)

## 2024-01-20 LAB — TSH: TSH: 6.46 u[IU]/mL — ABNORMAL HIGH (ref 0.350–4.500)

## 2024-01-20 MED ORDER — SODIUM CHLORIDE 0.9 % IV SOLN
480.0000 mg | Freq: Once | INTRAVENOUS | Status: AC
  Administered 2024-01-20: 480 mg via INTRAVENOUS
  Filled 2024-01-20: qty 48

## 2024-01-20 MED ORDER — LEVOTHYROXINE SODIUM 50 MCG PO TABS: 50.0000 ug | ORAL_TABLET | Freq: Every day | ORAL | 1 refills | Status: DC

## 2024-01-20 MED ORDER — SODIUM CHLORIDE 0.9 % IV SOLN: Freq: Once | INTRAVENOUS | Status: AC

## 2024-01-20 MED ORDER — HEPARIN SOD (PORK) LOCK FLUSH 100 UNIT/ML IV SOLN
500.0000 [IU] | Freq: Once | INTRAVENOUS | Status: AC | PRN
  Administered 2024-01-20: 500 [IU]

## 2024-01-20 MED ORDER — SODIUM CHLORIDE 0.9% FLUSH
10.0000 mL | INTRAVENOUS | Status: DC | PRN
  Administered 2024-01-20: 10 mL

## 2024-01-20 NOTE — Patient Instructions (Signed)
 Nivolumab Injection What is this medication? NIVOLUMAB (nye VOL ue mab) treats some types of cancer. It works by helping your immune system slow or stop the spread of cancer cells. It is a monoclonal antibody. This medicine may be used for other purposes; ask your health care provider or pharmacist if you have questions. COMMON BRAND NAME(S): Opdivo What should I tell my care team before I take this medication? They need to know if you have any of these conditions: Allogeneic stem cell transplant (uses someone else's stem cells) Autoimmune diseases, such as Crohn disease, ulcerative colitis, lupus History of chest radiation Nervous system problems, such as Guillain-Barre syndrome or myasthenia gravis Organ transplant An unusual or allergic reaction to nivolumab, other medications, foods, dyes, or preservatives Pregnant or trying to get pregnant Breast-feeding How should I use this medication? This medication is infused into a vein. It is given in a hospital or clinic setting. A special MedGuide will be given to you before each treatment. Be sure to read this information carefully each time. Talk to your care team about the use of this medication in children. While it may be prescribed for children as young as 12 years for selected conditions, precautions do apply. Overdosage: If you think you have taken too much of this medicine contact a poison control center or emergency room at once. NOTE: This medicine is only for you. Do not share this medicine with others. What if I miss a dose? Keep appointments for follow-up doses. It is important not to miss your dose. Call your care team if you are unable to keep an appointment. What may interact with this medication? Interactions have not been studied. This list may not describe all possible interactions. Give your health care provider a list of all the medicines, herbs, non-prescription drugs, or dietary supplements you use. Also tell them if you  smoke, drink alcohol, or use illegal drugs. Some items may interact with your medicine. What should I watch for while using this medication? Your condition will be monitored carefully while you are receiving this medication. You may need blood work while taking this medication. This medication may cause serious skin reactions. They can happen weeks to months after starting the medication. Contact your care team right away if you notice fevers or flu-like symptoms with a rash. The rash may be red or purple and then turn into blisters or peeling of the skin. You may also notice a red rash with swelling of the face, lips, or lymph nodes in your neck or under your arms. Tell your care team right away if you have any change in your eyesight. Talk to your care team if you are pregnant or think you might be pregnant. A negative pregnancy test is required before starting this medication. A reliable form of contraception is recommended while taking this medication and for 5 months after the last dose. Talk to your care team about effective forms of contraception. Do not breast-feed while taking this medication and for 5 months after the last dose. What side effects may I notice from receiving this medication? Side effects that you should report to your care team as soon as possible: Allergic reactions--skin rash, itching, hives, swelling of the face, lips, tongue, or throat Dry cough, shortness of breath or trouble breathing Eye pain, redness, irritation, or discharge with blurry or decreased vision Heart muscle inflammation--unusual weakness or fatigue, shortness of breath, chest pain, fast or irregular heartbeat, dizziness, swelling of the ankles, feet, or hands Hormone  gland problems--headache, sensitivity to light, unusual weakness or fatigue, dizziness, fast or irregular heartbeat, increased sensitivity to cold or heat, excessive sweating, constipation, hair loss, increased thirst or amount of urine,  tremors or shaking, irritability Infusion reactions--chest pain, shortness of breath or trouble breathing, feeling faint or lightheaded Kidney injury (glomerulonephritis)--decrease in the amount of urine, red or dark brown urine, foamy or bubbly urine, swelling of the ankles, hands, or feet Liver injury--right upper belly pain, loss of appetite, nausea, light-colored stool, dark yellow or brown urine, yellowing skin or eyes, unusual weakness or fatigue Pain, tingling, or numbness in the hands or feet, muscle weakness, change in vision, confusion or trouble speaking, loss of balance or coordination, trouble walking, seizures Rash, fever, and swollen lymph nodes Redness, blistering, peeling, or loosening of the skin, including inside the mouth Sudden or severe stomach pain, bloody diarrhea, fever, nausea, vomiting Side effects that usually do not require medical attention (report these to your care team if they continue or are bothersome): Bone, joint, or muscle pain Diarrhea Fatigue Loss of appetite Nausea Skin rash This list may not describe all possible side effects. Call your doctor for medical advice about side effects. You may report side effects to FDA at 1-800-FDA-1088. Where should I keep my medication? This medication is given in a hospital or clinic. It will not be stored at home. NOTE: This sheet is a summary. It may not cover all possible information. If you have questions about this medicine, talk to your doctor, pharmacist, or health care provider.  2024 Elsevier/Gold Standard (2021-11-30 00:00:00)

## 2024-01-20 NOTE — Patient Instructions (Signed)
 [facility] - A DEPT OF [facility]  Discharge Instructions: Thank you for choosing [facility] to provide your oncology and hematology care.   If you have a lab appointment with the Cancer Center, please go directly to the Cancer Center and check in at the registration area.  Wear comfortable clothing and clothing appropriate for easy access to any Portacath or PICC line.   We strive to give you quality time with your provider. You may need to reschedule your appointment if you arrive late (15 or more minutes).  Arriving late affects you and other patients whose appointments are after yours.  Also, if you miss three or more appointments without notifying the office, you may be dismissed from the clinic at the provider's discretion.      For prescription refill requests, have your pharmacy contact our office and allow 72 hours for refills to be completed.    Today you received the following chemotherapy and/or immunotherapy agents Nivolumab    To help prevent nausea and vomiting after your treatment, we encourage you to take your nausea medication as directed.  BELOW ARE SYMPTOMS THAT SHOULD BE REPORTED IMMEDIATELY: *FEVER GREATER THAN 100.4 F (38 C) OR HIGHER *CHILLS OR SWEATING *NAUSEA AND VOMITING THAT IS NOT CONTROLLED WITH YOUR NAUSEA MEDICATION *UNUSUAL SHORTNESS OF BREATH *UNUSUAL BRUISING OR BLEEDING *URINARY PROBLEMS (pain or burning when urinating, or frequent urination) *BOWEL PROBLEMS (unusual diarrhea, constipation, pain near the anus) TENDERNESS IN MOUTH AND THROAT WITH OR WITHOUT PRESENCE OF ULCERS (sore throat, sores in mouth, or a toothache) UNUSUAL RASH, SWELLING OR PAIN  UNUSUAL VAGINAL DISCHARGE OR ITCHING   Items with * indicate a potential emergency and should be followed up as soon as possible or go to the Emergency Department if any problems should occur.  Please show the CHEMOTHERAPY ALERT CARD or IMMUNOTHERAPY  ALERT CARD at check-in to the Emergency Department and triage nurse. Should you have questions after your visit or need to cancel or reschedule your appointment, please contact [facility] - A DEPT OF [facility]  [phone] and follow the prompts.  Office hours are 8:00 a.m. to 4:30 p.m. Monday - Friday. Please note that voicemails left after 4:00 p.m. may not be returned until the following business day.  We are closed weekends and major holidays. You have access to a nurse at all times for urgent questions. Please call the main number to the clinic [phone] and follow the prompts.  For any non-urgent questions, you may also contact your provider using MyChart. We now offer e-Visits for anyone 14 and older to request care online for non-urgent symptoms. For details visit [email or web address].   Also download the MyChart app! Go to the app store, search "MyChart", open the app, select [place], and log in with your MyChart username and password.

## 2024-01-20 NOTE — Progress Notes (Signed)
 Hematology and Oncology Follow Up Visit  Kevin Hunter [id number] [birth year] 58 y.o. 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 He is doing quite well and has no complaints at this time.  Work is going well and he denies fatigue at this time.  No issues with infection.  No fever, chills, n/v, cough, rash, dizziness, SOB, chest pain, palpitations, abdominal pain or changes in bowel or bladder habits.  No swelling, tenderness, numbness or tingling in his extremities att this time.  No falls or syncope.  Appetite and hydration are good. Weight is stable at 289 lbs.  No blood loss, bruising or petechiae.   ECOG Performance Status: 1 - Symptomatic but completely ambulatory  Medications:  Allergies as of 01/20/2024       Reactions   Paclitaxel  Anaphylaxis   Penicillins Other (See Comments)   Unknown Reaction when he was an infant.        Medication List        Accurate as of January 20, 2024 10:19 AM. If you have any questions, ask your nurse or doctor.          amLODipine  5 MG tablet Commonly known as: NORVASC  Take 5 mg by mouth at bedtime.   clonazePAM  1 MG tablet Commonly known as: KLONOPIN  Take 1 mg by mouth 2 (two) times daily as needed for anxiety (May take additional 0.5mg  as needed for panic attack).   pantoprazole  40 MG tablet Commonly known as: PROTONIX  TAKE 1 TABLET (40 MG TOTAL) BY MOUTH TWICE A DAY BEFORE MEALS What changed: See the new instructions.   potassium chloride  SA 20  MEQ tablet Commonly known as: KLOR-CON  M TAKE 1 TABLET BY MOUTH EVERY DAY   psyllium 58.6 % packet Commonly known as: METAMUCIL Take 1 packet by mouth daily.   QUEtiapine  400 MG 24 hr tablet Commonly known as: SEROQUEL  XR Take 400 mg by mouth at bedtime.   sildenafil 20 MG tablet Commonly known as: REVATIO Take 20-100 mg by mouth daily as needed.   solifenacin 10 MG tablet Commonly known as: VESICARE Take 10 mg by mouth daily.   traZODone  100 MG tablet Commonly known as: DESYREL  Take 300 mg by mouth at bedtime as needed for sleep.   venlafaxine  75 MG tablet Commonly known as: EFFEXOR  Take 225 mg by mouth at bedtime.        Allergies:  Allergies  Allergen Reactions   Paclitaxel  Anaphylaxis   Penicillins Other (See Comments)    Unknown Reaction when he was an infant.    Past Medical History, Surgical history, Social history, and Family History were reviewed and updated.  Review of Systems: All other 10 point review of systems is negative.   Physical Exam:  weight is 289 lb 9.6 oz (131.4 kg). His oral temperature is 97.9 F (36.6 C). His blood pressure is 137/90 (abnormal) and his pulse is 72. His respiration is 16 and oxygen saturation is 18% (abnormal).   Wt Readings from Last  3 Encounters:  01/20/24 289 lb 9.6 oz (131.4 kg)  12/23/23 287 lb (130.2 kg)  11/25/23 283 lb (128.4 kg)    Ocular: Sclerae unicteric, pupils equal, round and reactive to light Ear-nose-throat: Oropharynx clear, dentition fair Lymphatic: No cervical or supraclavicular adenopathy Lungs no rales or rhonchi, good excursion bilaterally Heart regular rate and rhythm, no murmur appreciated Abd soft, nontender, positive bowel sounds MSK no focal spinal tenderness, no joint edema Neuro: non-focal, well-oriented, appropriate affect Breasts: Deferred   Lab Results  Component Value Date   WBC 8.5 01/20/2024   HGB 15.0 01/20/2024   HCT 43.3 01/20/2024   MCV 91.5 01/20/2024   PLT 183  01/20/2024   No results found for: "FERRITIN", "IRON", "TIBC", "UIBC", "IRONPCTSAT" Lab Results  Component Value Date   RBC 4.73 01/20/2024   No results found for: "KPAFRELGTCHN", "LAMBDASER", "KAPLAMBRATIO" No results found for: "IGGSERUM", "IGA", "IGMSERUM" No results found for: "TOTALPROTELP", "ALBUMINELP", "A1GS", "A2GS", "BETS", "BETA2SER", "GAMS", "MSPIKE", "SPEI"   Chemistry      Component Value Date/Time   NA 139 12/23/2023 0810   K 3.7 12/23/2023 0810   CL 104 12/23/2023 0810   CO2 28 12/23/2023 0810   BUN 12 12/23/2023 0810   CREATININE 1.19 12/23/2023 0810      Component Value Date/Time   CALCIUM 8.8 (L) 12/23/2023 0810   ALKPHOS 73 12/23/2023 0810   AST 14 (L) 12/23/2023 0810   ALT 17 12/23/2023 0810   BILITOT 0.3 12/23/2023 0810       Impression and Plan: Kevin Hunter is a very pleasant 58 yo caucasian gentleman with 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 He completed his chemotherapy along with immunotherapy.  He now is on maintenance therapy with nivolumab  and tolerating well with a nice response.  So far, we have not seen any evidence of recurrent or progressive disease. We will proceed with treatment today as planned.  TSH/T4 pending.  We will plan to repeat his PET scan in August. Dr. Mark Sil also plans to repeat his MRI of the brain in August as well.  Follow-up in 4 weeks.   Kevin Pea, NP 6/6/202510:19 AM

## 2024-01-21 LAB — T4: T4, Total: 5.2 ug/dL (ref 4.5–12.0)

## 2024-02-13 ENCOUNTER — Other Ambulatory Visit: Payer: Self-pay | Admitting: Hematology & Oncology

## 2024-02-16 ENCOUNTER — Encounter: Payer: Self-pay | Admitting: Hematology & Oncology

## 2024-02-16 ENCOUNTER — Inpatient Hospital Stay

## 2024-02-16 ENCOUNTER — Inpatient Hospital Stay: Attending: Internal Medicine

## 2024-02-16 ENCOUNTER — Inpatient Hospital Stay (HOSPITAL_BASED_OUTPATIENT_CLINIC_OR_DEPARTMENT_OTHER): Admitting: Hematology & Oncology

## 2024-02-16 VITALS — BP 132/90 | HR 60 | Temp 97.9°F | Resp 20 | Ht 71.0 in | Wt 287.0 lb

## 2024-02-16 VITALS — BP 158/97 | HR 57 | Resp 17

## 2024-02-16 DIAGNOSIS — Z79890 Hormone replacement therapy (postmenopausal): Secondary | ICD-10-CM | POA: Insufficient documentation

## 2024-02-16 DIAGNOSIS — C3491 Malignant neoplasm of unspecified part of right bronchus or lung: Secondary | ICD-10-CM

## 2024-02-16 DIAGNOSIS — C7931 Secondary malignant neoplasm of brain: Secondary | ICD-10-CM | POA: Insufficient documentation

## 2024-02-16 DIAGNOSIS — C3411 Malignant neoplasm of upper lobe, right bronchus or lung: Secondary | ICD-10-CM | POA: Insufficient documentation

## 2024-02-16 DIAGNOSIS — E039 Hypothyroidism, unspecified: Secondary | ICD-10-CM | POA: Diagnosis not present

## 2024-02-16 DIAGNOSIS — Z5112 Encounter for antineoplastic immunotherapy: Secondary | ICD-10-CM | POA: Diagnosis present

## 2024-02-16 DIAGNOSIS — C3490 Malignant neoplasm of unspecified part of unspecified bronchus or lung: Secondary | ICD-10-CM

## 2024-02-16 DIAGNOSIS — E032 Hypothyroidism due to medicaments and other exogenous substances: Secondary | ICD-10-CM

## 2024-02-16 LAB — CBC WITH DIFFERENTIAL (CANCER CENTER ONLY)
Abs Immature Granulocytes: 0.05 10*3/uL (ref 0.00–0.07)
Basophils Absolute: 0.1 10*3/uL (ref 0.0–0.1)
Basophils Relative: 1 %
Eosinophils Absolute: 0.2 10*3/uL (ref 0.0–0.5)
Eosinophils Relative: 2 %
HCT: 43.6 % (ref 39.0–52.0)
Hemoglobin: 15 g/dL (ref 13.0–17.0)
Immature Granulocytes: 1 %
Lymphocytes Relative: 18 %
Lymphs Abs: 1.3 10*3/uL (ref 0.7–4.0)
MCH: 31.7 pg (ref 26.0–34.0)
MCHC: 34.4 g/dL (ref 30.0–36.0)
MCV: 92.2 fL (ref 80.0–100.0)
Monocytes Absolute: 0.6 10*3/uL (ref 0.1–1.0)
Monocytes Relative: 8 %
Neutro Abs: 5.1 10*3/uL (ref 1.7–7.7)
Neutrophils Relative %: 70 %
Platelet Count: 167 10*3/uL (ref 150–400)
RBC: 4.73 MIL/uL (ref 4.22–5.81)
RDW: 14.1 % (ref 11.5–15.5)
WBC Count: 7.2 10*3/uL (ref 4.0–10.5)
nRBC: 0 % (ref 0.0–0.2)

## 2024-02-16 LAB — CMP (CANCER CENTER ONLY)
ALT: 16 U/L (ref 0–44)
AST: 14 U/L — ABNORMAL LOW (ref 15–41)
Albumin: 4.3 g/dL (ref 3.5–5.0)
Alkaline Phosphatase: 61 U/L (ref 38–126)
Anion gap: 8 (ref 5–15)
BUN: 13 mg/dL (ref 6–20)
CO2: 27 mmol/L (ref 22–32)
Calcium: 8.9 mg/dL (ref 8.9–10.3)
Chloride: 103 mmol/L (ref 98–111)
Creatinine: 1.2 mg/dL (ref 0.61–1.24)
GFR, Estimated: >60 mL/min (ref >60)
Glucose, Bld: 83 mg/dL (ref 70–99)
Potassium: 3.6 mmol/L (ref 3.5–5.1)
Sodium: 138 mmol/L (ref 135–145)
Total Bilirubin: 0.4 mg/dL (ref 0.0–1.2)
Total Protein: 7.3 g/dL (ref 6.5–8.1)

## 2024-02-16 LAB — TSH: TSH: 3.68 u[IU]/mL (ref 0.350–4.500)

## 2024-02-16 MED ORDER — SODIUM CHLORIDE 0.9% FLUSH
10.0000 mL | INTRAVENOUS | Status: DC | PRN
  Administered 2024-02-16: 10 mL

## 2024-02-16 MED ORDER — PANTOPRAZOLE SODIUM 40 MG PO TBEC
40.0000 mg | DELAYED_RELEASE_TABLET | Freq: Every day | ORAL | 6 refills | Status: AC
Start: 2024-02-16 — End: ?

## 2024-02-16 MED ORDER — SODIUM CHLORIDE 0.9 % IV SOLN: Freq: Once | INTRAVENOUS | Status: AC

## 2024-02-16 MED ORDER — HEPARIN SOD (PORK) LOCK FLUSH 100 UNIT/ML IV SOLN
500.0000 [IU] | Freq: Once | INTRAVENOUS | Status: AC | PRN
  Administered 2024-02-16: 500 [IU]

## 2024-02-16 MED ORDER — SODIUM CHLORIDE 0.9 % IV SOLN
480.0000 mg | Freq: Once | INTRAVENOUS | Status: AC
  Administered 2024-02-16: 480 mg via INTRAVENOUS
  Filled 2024-02-16: qty 48

## 2024-02-16 NOTE — Progress Notes (Signed)
 Hematology and Oncology Follow Up Visit  Kevin Hunter [id number] 1965-08-21 58 y.o. 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  Overall, he continues to do amazingly well.  He really has had no complaints.  He has had no issues with the Opdivo .  He recently was found to have hypothyroidism.  He now is on Synthroid  at 50 mcg p.o. daily.  He continues to work.  He is busy at work.   He has  had no problems working.  He has had no problems with headaches.  His next MRI is due in August.  His last PET scan was done back in April.  He has had no rashes.  There has been no bleeding.  He has had no tingling in hands or feet.  He has had no visual changes.  Overall, I would say that his performance status is ECOG 0.  .    Medications:  Allergies as of 02/16/2024       Reactions   Paclitaxel  Anaphylaxis   Penicillins Other (See Comments)   Unknown Reaction when he was an infant.        Medication List        Accurate as of February 16, 2024  8:40 AM. If you have any questions, ask your nurse or doctor.          amLODipine  5 MG tablet Commonly known as: NORVASC  Take 5 mg by mouth at bedtime.   clonazePAM  1 MG tablet Commonly known as: KLONOPIN  Take 1 mg by mouth 2 (two) times daily as needed for anxiety (May take additional 0.5mg  as needed for panic attack).   levothyroxine  50 MCG tablet Commonly known as: SYNTHROID  TAKE 1 TABLET BY MOUTH DAILY BEFORE BREAKFAST   pantoprazole  40 MG  tablet Commonly known as: PROTONIX  Take 1 tablet (40 mg total) by mouth daily. What changed: See the new instructions. Changed by: Kevin Hunter   potassium chloride  SA 20 MEQ tablet Commonly known as: KLOR-CON  M TAKE 1 TABLET BY MOUTH EVERY DAY   psyllium 58.6 % packet Commonly known as: METAMUCIL Take 1 packet by mouth daily.   QUEtiapine  400 MG 24 hr tablet Commonly known as: SEROQUEL  XR Take 400 mg by mouth at bedtime.   sildenafil 20 MG tablet Commonly known as: REVATIO Take 20-100 mg by mouth daily as needed.   solifenacin 10 MG tablet Commonly known as: VESICARE Take 10 mg by mouth daily.   traZODone  100 MG tablet Commonly known as: DESYREL  Take 300 mg by mouth at bedtime as needed for sleep.   venlafaxine  75 MG tablet Commonly known as: EFFEXOR  Take 225 mg by mouth at bedtime.        Allergies:  Allergies  Allergen Reactions   Paclitaxel  Anaphylaxis   Penicillins Other (See Comments)    Unknown Reaction when he was an infant.    Past Medical History, Surgical history, Social history, and Family History were reviewed and updated.  Review of Systems: Review  of Systems  Constitutional: Negative.   HENT: Negative.    Eyes: Negative.   Respiratory: Negative.    Cardiovascular: Negative.   Gastrointestinal: Negative.   Genitourinary: Negative.   Musculoskeletal: Negative.   Skin: Negative.   Neurological:  Positive for tingling.  Endo/Heme/Allergies: Negative.   Psychiatric/Behavioral: Negative.        Physical Exam: Vital signs are a temperature 97.9.  Pulse 60.  Blood pressure 132/90.  Weight is 287 pounds.    Wt Readings from Last 3 Encounters:  02/16/24 287 lb (130.2 kg)  01/20/24 289 lb 9.6 oz (131.4 kg)  12/23/23 287 lb (130.2 kg)    Physical Exam Vitals reviewed.  HENT:     Head: Normocephalic and atraumatic.  Eyes:     Pupils: Pupils are equal, round, and reactive to light.  Cardiovascular:     Rate and Rhythm: Normal rate  and regular rhythm.     Heart sounds: Normal heart sounds.     Comments: Cardiac exam is markedly tachycardic but regular.  He has no murmurs, rubs or bruits. Pulmonary:     Effort: Pulmonary effort is normal.     Breath sounds: Normal breath sounds.     Comments: His lungs sound clear bilaterally.  I do not hear any wheezing.  There is no rhonchi. Abdominal:     General: Bowel sounds are normal.     Palpations: Abdomen is soft.  Musculoskeletal:        General: No tenderness or deformity. Normal range of motion.     Cervical back: Normal range of motion.  Lymphadenopathy:     Cervical: No cervical adenopathy.  Skin:    General: Skin is warm and dry.     Findings: No erythema or rash.  Neurological:     Mental Status: He is alert and oriented to person, place, and time.  Psychiatric:        Behavior: Behavior normal.        Thought Content: Thought content normal.        Judgment: Judgment normal.     Lab Results  Component Value Date   WBC 7.2 02/16/2024   HGB 15.0 02/16/2024   HCT 43.6 02/16/2024   MCV 92.2 02/16/2024   PLT 167 02/16/2024   No results found for: FERRITIN, IRON, TIBC, UIBC, IRONPCTSAT Lab Results  Component Value Date   RBC 4.73 02/16/2024   No results found for: KPAFRELGTCHN, LAMBDASER, KAPLAMBRATIO No results found for: IGGSERUM, IGA, IGMSERUM No results found for: Kevin Hunter, SPEI   Chemistry      Component Value Date/Time   NA 138 01/20/2024 0950   K 3.4 (L) 01/20/2024 0950   CL 102 01/20/2024 0950   CO2 27 01/20/2024 0950   BUN 14 01/20/2024 0950   CREATININE 1.20 01/20/2024 0950      Component Value Date/Time   CALCIUM 9.0 01/20/2024 0950   ALKPHOS 69 01/20/2024 0950   AST 16 01/20/2024 0950   ALT 18 01/20/2024 0950   BILITOT 0.4 01/20/2024 0950       Impression and Plan: Kevin Hunter is a very pleasant 58 yo caucasian gentleman with  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 He completed his chemotherapy along with immunotherapy.   He now is on maintenance therapy with nivolumab  and tolerating well with a nice response.  So far, we have not seen any evidence of recurrent or progressive disease.  He probably does not  need another PET scan until August.  We will plan to get him back in 1 month.     7/3/20258:40 AM

## 2024-02-16 NOTE — Patient Instructions (Signed)
 [facility] - A DEPT OF [facility]  Discharge Instructions: Thank you for choosing [facility] to provide your oncology and hematology care.   If you have a lab appointment with the Cancer Center, please go directly to the Cancer Center and check in at the registration area.  Wear comfortable clothing and clothing appropriate for easy access to any Portacath or PICC line.   We strive to give you quality time with your provider. You may need to reschedule your appointment if you arrive late (15 or more minutes).  Arriving late affects you and other patients whose appointments are after yours.  Also, if you miss three or more appointments without notifying the office, you may be dismissed from the clinic at the provider's discretion.      For prescription refill requests, have your pharmacy contact our office and allow 72 hours for refills to be completed.    Today you received the following chemotherapy and/or immunotherapy agents Nivolumab    To help prevent nausea and vomiting after your treatment, we encourage you to take your nausea medication as directed.  BELOW ARE SYMPTOMS THAT SHOULD BE REPORTED IMMEDIATELY: *FEVER GREATER THAN 100.4 F (38 C) OR HIGHER *CHILLS OR SWEATING *NAUSEA AND VOMITING THAT IS NOT CONTROLLED WITH YOUR NAUSEA MEDICATION *UNUSUAL SHORTNESS OF BREATH *UNUSUAL BRUISING OR BLEEDING *URINARY PROBLEMS (pain or burning when urinating, or frequent urination) *BOWEL PROBLEMS (unusual diarrhea, constipation, pain near the anus) TENDERNESS IN MOUTH AND THROAT WITH OR WITHOUT PRESENCE OF ULCERS (sore throat, sores in mouth, or a toothache) UNUSUAL RASH, SWELLING OR PAIN  UNUSUAL VAGINAL DISCHARGE OR ITCHING   Items with * indicate a potential emergency and should be followed up as soon as possible or go to the Emergency Department if any problems should occur.  Please show the CHEMOTHERAPY ALERT CARD or IMMUNOTHERAPY  ALERT CARD at check-in to the Emergency Department and triage nurse. Should you have questions after your visit or need to cancel or reschedule your appointment, please contact [facility] - A DEPT OF [facility]  [phone] and follow the prompts.  Office hours are 8:00 a.m. to 4:30 p.m. Monday - Friday. Please note that voicemails left after 4:00 p.m. may not be returned until the following business day.  We are closed weekends and major holidays. You have access to a nurse at all times for urgent questions. Please call the main number to the clinic [phone] and follow the prompts.  For any non-urgent questions, you may also contact your provider using MyChart. We now offer e-Visits for anyone 14 and older to request care online for non-urgent symptoms. For details visit [email or web address].   Also download the MyChart app! Go to the app store, search "MyChart", open the app, select [place], and log in with your MyChart username and password.

## 2024-02-16 NOTE — Progress Notes (Signed)
 Per Dr. Timmy ok to treat with current blood pressure. Pt states he forgot to take his blood pressure medication.

## 2024-02-16 NOTE — Patient Instructions (Signed)

## 2024-02-17 LAB — T4: T4, Total: 6.1 ug/dL (ref 4.5–12.0)

## 2024-02-21 ENCOUNTER — Other Ambulatory Visit: Payer: Self-pay

## 2024-03-08 ENCOUNTER — Other Ambulatory Visit: Payer: Self-pay | Admitting: *Deleted

## 2024-03-08 ENCOUNTER — Telehealth: Payer: Self-pay | Admitting: *Deleted

## 2024-03-08 DIAGNOSIS — C7931 Secondary malignant neoplasm of brain: Secondary | ICD-10-CM

## 2024-03-08 NOTE — Telephone Encounter (Signed)
 Left VM for patient, informed him MRI location has been changed to [facility] due to his insurance.  MRI is scheduled on 03/29/24 at 2:00, he is to arrive at 1:30.  Patient instructed to contact this office with any questions/concerns, [phone].

## 2024-03-16 ENCOUNTER — Encounter: Payer: Self-pay | Admitting: Hematology & Oncology

## 2024-03-16 ENCOUNTER — Inpatient Hospital Stay

## 2024-03-16 ENCOUNTER — Inpatient Hospital Stay (HOSPITAL_BASED_OUTPATIENT_CLINIC_OR_DEPARTMENT_OTHER): Admitting: Hematology & Oncology

## 2024-03-16 ENCOUNTER — Ambulatory Visit: Payer: Self-pay | Admitting: Hematology & Oncology

## 2024-03-16 ENCOUNTER — Inpatient Hospital Stay: Attending: Internal Medicine

## 2024-03-16 VITALS — BP 133/79 | HR 59

## 2024-03-16 VITALS — BP 108/86 | HR 64 | Temp 97.8°F | Resp 20 | Ht 71.0 in | Wt 286.0 lb

## 2024-03-16 DIAGNOSIS — Z79620 Long term (current) use of immunosuppressive biologic: Secondary | ICD-10-CM | POA: Diagnosis not present

## 2024-03-16 DIAGNOSIS — Z5112 Encounter for antineoplastic immunotherapy: Secondary | ICD-10-CM | POA: Insufficient documentation

## 2024-03-16 DIAGNOSIS — C7931 Secondary malignant neoplasm of brain: Secondary | ICD-10-CM | POA: Diagnosis not present

## 2024-03-16 DIAGNOSIS — C3491 Malignant neoplasm of unspecified part of right bronchus or lung: Secondary | ICD-10-CM | POA: Diagnosis not present

## 2024-03-16 DIAGNOSIS — C7970 Secondary malignant neoplasm of unspecified adrenal gland: Secondary | ICD-10-CM | POA: Diagnosis not present

## 2024-03-16 DIAGNOSIS — C3411 Malignant neoplasm of upper lobe, right bronchus or lung: Secondary | ICD-10-CM | POA: Diagnosis present

## 2024-03-16 LAB — CMP (CANCER CENTER ONLY)
ALT: 21 U/L (ref 0–44)
AST: 21 U/L (ref 15–41)
Albumin: 4.3 g/dL (ref 3.5–5.0)
Alkaline Phosphatase: 68 U/L (ref 38–126)
Anion gap: 12 (ref 5–15)
BUN: 11 mg/dL (ref 6–20)
CO2: 25 mmol/L (ref 22–32)
Calcium: 8.7 mg/dL — ABNORMAL LOW (ref 8.9–10.3)
Chloride: 103 mmol/L (ref 98–111)
Creatinine: 1.16 mg/dL (ref 0.61–1.24)
GFR, Estimated: >60 mL/min (ref >60)
Glucose, Bld: 127 mg/dL — ABNORMAL HIGH (ref 70–99)
Potassium: 3.4 mmol/L — ABNORMAL LOW (ref 3.5–5.1)
Sodium: 140 mmol/L (ref 135–145)
Total Bilirubin: 0.3 mg/dL (ref 0.0–1.2)
Total Protein: 7.1 g/dL (ref 6.5–8.1)

## 2024-03-16 LAB — TSH: TSH: 2.26 u[IU]/mL (ref 0.350–4.500)

## 2024-03-16 LAB — CBC WITH DIFFERENTIAL (CANCER CENTER ONLY)
Abs Immature Granulocytes: 0.04 K/uL (ref 0.00–0.07)
Basophils Absolute: 0.1 K/uL (ref 0.0–0.1)
Basophils Relative: 1 %
Eosinophils Absolute: 0.1 K/uL (ref 0.0–0.5)
Eosinophils Relative: 2 %
HCT: 42.8 % (ref 39.0–52.0)
Hemoglobin: 14.8 g/dL (ref 13.0–17.0)
Immature Granulocytes: 1 %
Lymphocytes Relative: 18 %
Lymphs Abs: 1.2 K/uL (ref 0.7–4.0)
MCH: 31.8 pg (ref 26.0–34.0)
MCHC: 34.6 g/dL (ref 30.0–36.0)
MCV: 91.8 fL (ref 80.0–100.0)
Monocytes Absolute: 0.5 K/uL (ref 0.1–1.0)
Monocytes Relative: 8 %
Neutro Abs: 4.7 K/uL (ref 1.7–7.7)
Neutrophils Relative %: 70 %
Platelet Count: 170 K/uL (ref 150–400)
RBC: 4.66 MIL/uL (ref 4.22–5.81)
RDW: 14.3 % (ref 11.5–15.5)
WBC Count: 6.6 K/uL (ref 4.0–10.5)
nRBC: 0 % (ref 0.0–0.2)

## 2024-03-16 MED ORDER — SODIUM CHLORIDE 0.9 % IV SOLN
Freq: Once | INTRAVENOUS | Status: AC
Start: 2024-03-16 — End: 2024-03-16

## 2024-03-16 MED ORDER — HEPARIN SOD (PORK) LOCK FLUSH 100 UNIT/ML IV SOLN
500.0000 [IU] | Freq: Once | INTRAVENOUS | Status: AC | PRN
  Administered 2024-03-16: 500 [IU]

## 2024-03-16 MED ORDER — SODIUM CHLORIDE 0.9 % IV SOLN
480.0000 mg | Freq: Once | INTRAVENOUS | Status: AC
  Administered 2024-03-16: 480 mg via INTRAVENOUS
  Filled 2024-03-16: qty 48

## 2024-03-16 MED ORDER — KETOROLAC TROMETHAMINE 15 MG/ML IJ SOLN
30.0000 mg | Freq: Once | INTRAMUSCULAR | Status: DC
  Filled 2024-03-16: qty 2

## 2024-03-16 MED ORDER — SODIUM CHLORIDE 0.9% FLUSH
10.0000 mL | INTRAVENOUS | Status: DC | PRN
Start: 2024-03-16 — End: 2024-03-16
  Administered 2024-03-16: 10 mL

## 2024-03-16 MED ORDER — KETOROLAC TROMETHAMINE 15 MG/ML IJ SOLN
30.0000 mg | Freq: Once | INTRAMUSCULAR | Status: AC
  Administered 2024-03-16: 30 mg via INTRAVENOUS

## 2024-03-16 NOTE — Patient Instructions (Signed)
 Nivolumab Injection What is this medication? NIVOLUMAB (nye VOL ue mab) treats some types of cancer. It works by helping your immune system slow or stop the spread of cancer cells. It is a monoclonal antibody. This medicine may be used for other purposes; ask your health care provider or pharmacist if you have questions. COMMON BRAND NAME(S): Opdivo What should I tell my care team before I take this medication? They need to know if you have any of these conditions: Allogeneic stem cell transplant (uses someone else's stem cells) Autoimmune diseases, such as Crohn disease, ulcerative colitis, lupus History of chest radiation Nervous system problems, such as Guillain-Barre syndrome or myasthenia gravis Organ transplant An unusual or allergic reaction to nivolumab, other medications, foods, dyes, or preservatives Pregnant or trying to get pregnant Breast-feeding How should I use this medication? This medication is infused into a vein. It is given in a hospital or clinic setting. A special MedGuide will be given to you before each treatment. Be sure to read this information carefully each time. Talk to your care team about the use of this medication in children. While it may be prescribed for children as young as 12 years for selected conditions, precautions do apply. Overdosage: If you think you have taken too much of this medicine contact a poison control center or emergency room at once. NOTE: This medicine is only for you. Do not share this medicine with others. What if I miss a dose? Keep appointments for follow-up doses. It is important not to miss your dose. Call your care team if you are unable to keep an appointment. What may interact with this medication? Interactions have not been studied. This list may not describe all possible interactions. Give your health care provider a list of all the medicines, herbs, non-prescription drugs, or dietary supplements you use. Also tell them if you  smoke, drink alcohol, or use illegal drugs. Some items may interact with your medicine. What should I watch for while using this medication? Your condition will be monitored carefully while you are receiving this medication. You may need blood work while taking this medication. This medication may cause serious skin reactions. They can happen weeks to months after starting the medication. Contact your care team right away if you notice fevers or flu-like symptoms with a rash. The rash may be red or purple and then turn into blisters or peeling of the skin. You may also notice a red rash with swelling of the face, lips, or lymph nodes in your neck or under your arms. Tell your care team right away if you have any change in your eyesight. Talk to your care team if you are pregnant or think you might be pregnant. A negative pregnancy test is required before starting this medication. A reliable form of contraception is recommended while taking this medication and for 5 months after the last dose. Talk to your care team about effective forms of contraception. Do not breast-feed while taking this medication and for 5 months after the last dose. What side effects may I notice from receiving this medication? Side effects that you should report to your care team as soon as possible: Allergic reactions--skin rash, itching, hives, swelling of the face, lips, tongue, or throat Dry cough, shortness of breath or trouble breathing Eye pain, redness, irritation, or discharge with blurry or decreased vision Heart muscle inflammation--unusual weakness or fatigue, shortness of breath, chest pain, fast or irregular heartbeat, dizziness, swelling of the ankles, feet, or hands Hormone  gland problems--headache, sensitivity to light, unusual weakness or fatigue, dizziness, fast or irregular heartbeat, increased sensitivity to cold or heat, excessive sweating, constipation, hair loss, increased thirst or amount of urine,  tremors or shaking, irritability Infusion reactions--chest pain, shortness of breath or trouble breathing, feeling faint or lightheaded Kidney injury (glomerulonephritis)--decrease in the amount of urine, red or dark brown urine, foamy or bubbly urine, swelling of the ankles, hands, or feet Liver injury--right upper belly pain, loss of appetite, nausea, light-colored stool, dark yellow or brown urine, yellowing skin or eyes, unusual weakness or fatigue Pain, tingling, or numbness in the hands or feet, muscle weakness, change in vision, confusion or trouble speaking, loss of balance or coordination, trouble walking, seizures Rash, fever, and swollen lymph nodes Redness, blistering, peeling, or loosening of the skin, including inside the mouth Sudden or severe stomach pain, bloody diarrhea, fever, nausea, vomiting Side effects that usually do not require medical attention (report these to your care team if they continue or are bothersome): Bone, joint, or muscle pain Diarrhea Fatigue Loss of appetite Nausea Skin rash This list may not describe all possible side effects. Call your doctor for medical advice about side effects. You may report side effects to FDA at 1-800-FDA-1088. Where should I keep my medication? This medication is given in a hospital or clinic. It will not be stored at home. NOTE: This sheet is a summary. It may not cover all possible information. If you have questions about this medicine, talk to your doctor, pharmacist, or health care provider.  2024 Elsevier/Gold Standard (2021-11-30 00:00:00)

## 2024-03-16 NOTE — Patient Instructions (Signed)

## 2024-03-16 NOTE — Progress Notes (Signed)
 Hematology and Oncology Follow Up Visit  Kevin Hunter [id number] August 09, 1966 58 y.o. 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  Overall, he continues to do amazingly well.  He really has had no complaints.  He has had no issues with the Opdivo .  His last TSH was 3.68.  He is doing well on the Synthroid .  His mother is in town.  She came in from [place].  I know that there are going to have a wonderful time.  She would be able to see some of her grandchildren.  He has had no problems with bowels or bladder.  He has had no problem with headache.  He has another MRI of the brain I think August 14.  He has had no rashes.  There is been no bleeding.  He has had no problems with diarrhea.  He has had no leg swelling.  He is working quite a bit.  Overall, I will have to say that his performance status is probably ECOG 0.   Medications:  Allergies as of 03/16/2024       Reactions   Paclitaxel  Anaphylaxis   Penicillins Other (See Comments)   Unknown Reaction when he was an infant.        Medication List        Accurate as of March 16, 2024  8:26 AM. If you have any questions, ask your nurse or doctor.          STOP taking these medications    psyllium 58.6 % packet Commonly known as: METAMUCIL Stopped by: Kevin Hunter       TAKE these medications    amLODipine  5 MG tablet Commonly known as: NORVASC  Take 5 mg by mouth at bedtime.   clonazePAM  1 MG tablet Commonly known as:  KLONOPIN  Take 1 mg by mouth 2 (two) times daily as needed for anxiety (May take additional 0.5mg  as needed for panic attack).   levothyroxine  50 MCG tablet Commonly known as: SYNTHROID  TAKE 1 TABLET BY MOUTH DAILY BEFORE BREAKFAST   pantoprazole  40 MG tablet Commonly known as: PROTONIX  Take 1 tablet (40 mg total) by mouth daily.   potassium chloride  SA 20 MEQ tablet Commonly known as: KLOR-CON  M TAKE 1 TABLET BY MOUTH EVERY DAY   QUEtiapine  400 MG 24 hr tablet Commonly known as: SEROQUEL  XR Take 400 mg by mouth at bedtime.   sildenafil 20 MG tablet Commonly known as: REVATIO Take 20-100 mg by mouth daily as needed.   solifenacin 10 MG tablet Commonly known as: VESICARE Take 10 mg by mouth daily.   traZODone  100 MG tablet Commonly known as: DESYREL  Take 300 mg by mouth at bedtime as needed for sleep.   venlafaxine  75 MG tablet Commonly known as: EFFEXOR  Take 225 mg by mouth at bedtime.        Allergies:  Allergies  Allergen Reactions   Paclitaxel  Anaphylaxis   Penicillins Other (See Comments)    Unknown Reaction when he  was an infant.    Past Medical History, Surgical history, Social history, and Family History were reviewed and updated.  Review of Systems: Review of Systems  Constitutional: Negative.   HENT: Negative.    Eyes: Negative.   Respiratory: Negative.    Cardiovascular: Negative.   Gastrointestinal: Negative.   Genitourinary: Negative.   Musculoskeletal: Negative.   Skin: Negative.   Neurological:  Positive for tingling.  Endo/Heme/Allergies: Negative.   Psychiatric/Behavioral: Negative.        Physical Exam: Vital signs are a temperature 97.8.  Pulse 64.  Blood pressure 108/86.  Weight is 286 pounds.     Wt Readings from Last 3 Encounters:  03/16/24 286 lb (129.7 kg)  02/16/24 287 lb (130.2 kg)  01/20/24 289 lb 9.6 oz (131.4 kg)    Physical Exam Vitals reviewed.  HENT:     Head: Normocephalic and atraumatic.  Eyes:      Pupils: Pupils are equal, round, and reactive to light.  Cardiovascular:     Rate and Rhythm: Normal rate and regular rhythm.     Heart sounds: Normal heart sounds.     Comments: Cardiac exam is markedly tachycardic but regular.  He has no murmurs, rubs or bruits. Pulmonary:     Effort: Pulmonary effort is normal.     Breath sounds: Normal breath sounds.     Comments: His lungs sound clear bilaterally.  I do not hear any wheezing.  There is no rhonchi. Abdominal:     General: Bowel sounds are normal.     Palpations: Abdomen is soft.  Musculoskeletal:        General: No tenderness or deformity. Normal range of motion.     Cervical back: Normal range of motion.  Lymphadenopathy:     Cervical: No cervical adenopathy.  Skin:    General: Skin is warm and dry.     Findings: No erythema or rash.  Neurological:     Mental Status: He is alert and oriented to person, place, and time.  Psychiatric:        Behavior: Behavior normal.        Thought Content: Thought content normal.        Judgment: Judgment normal.     Lab Results  Component Value Date   WBC 6.6 03/16/2024   HGB 14.8 03/16/2024   HCT 42.8 03/16/2024   MCV 91.8 03/16/2024   PLT 170 03/16/2024   No results found for: FERRITIN, IRON, TIBC, UIBC, IRONPCTSAT Lab Results  Component Value Date   RBC 4.66 03/16/2024   No results found for: KPAFRELGTCHN, LAMBDASER, KAPLAMBRATIO No results found for: IGGSERUM, IGA, IGMSERUM No results found for: Kevin Hunter, Kevin Hunter   Chemistry      Component Value Date/Time   NA 138 02/16/2024 0745   K 3.6 02/16/2024 0745   CL 103 02/16/2024 0745   CO2 27 02/16/2024 0745   BUN 13 02/16/2024 0745   CREATININE 1.20 02/16/2024 0745      Component Value Date/Time   CALCIUM 8.9 02/16/2024 0745   ALKPHOS 61 02/16/2024 0745   AST 14 (L) 02/16/2024 0745   ALT 16 02/16/2024 0745   BILITOT 0.4  02/16/2024 0745       Impression and Plan: Kevin Hunter is a very pleasant 58 yo caucasian gentleman with 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 He completed his chemotherapy along with immunotherapy.   He now is on maintenance therapy with nivolumab   and tolerating well with a nice response.  So far, we have not seen any evidence of recurrent or progressive disease.  I will have another PET scan before I see him back.  Hopefully, this will show that he is still in remission.  I would have to think that he is in remission by his physical exam and by his clinical status.  I am just happy that his quality of life is so good right now.  We will plan to get him back in 5 weeks.   8/1/20258:26 AM

## 2024-03-17 LAB — T4: T4, Total: 6 ug/dL (ref 4.5–12.0)

## 2024-03-20 ENCOUNTER — Other Ambulatory Visit: Payer: Self-pay

## 2024-03-23 ENCOUNTER — Encounter: Payer: Self-pay | Admitting: Hematology & Oncology

## 2024-03-26 ENCOUNTER — Encounter: Payer: Self-pay | Admitting: Hematology & Oncology

## 2024-03-29 ENCOUNTER — Ambulatory Visit (HOSPITAL_COMMUNITY)
Admission: RE | Admit: 2024-03-29 | Discharge: 2024-03-29 | Disposition: A | Discharge status: Home / Self Care | Payer: Self-pay | Source: Ambulatory Visit | Attending: Internal Medicine | Admitting: Internal Medicine

## 2024-03-29 ENCOUNTER — Other Ambulatory Visit

## 2024-03-29 DIAGNOSIS — C7931 Secondary malignant neoplasm of brain: Secondary | ICD-10-CM | POA: Insufficient documentation

## 2024-03-29 MED ORDER — GADOBUTROL 1 MMOL/ML IV SOLN
10.0000 mL | Freq: Once | INTRAVENOUS | Status: AC | PRN
Start: 2024-03-29 — End: 2024-03-29
  Administered 2024-03-29: 10 mL via INTRAVENOUS

## 2024-04-03 ENCOUNTER — Inpatient Hospital Stay (HOSPITAL_BASED_OUTPATIENT_CLINIC_OR_DEPARTMENT_OTHER): Payer: BC Managed Care – PPO | Admitting: Internal Medicine

## 2024-04-03 VITALS — BP 154/95 | HR 66 | Temp 97.7°F | Resp 18 | Wt 289.4 lb

## 2024-04-03 DIAGNOSIS — Z5112 Encounter for antineoplastic immunotherapy: Secondary | ICD-10-CM | POA: Diagnosis not present

## 2024-04-03 DIAGNOSIS — C7931 Secondary malignant neoplasm of brain: Secondary | ICD-10-CM

## 2024-04-03 DIAGNOSIS — C3490 Malignant neoplasm of unspecified part of unspecified bronchus or lung: Secondary | ICD-10-CM

## 2024-04-03 NOTE — Progress Notes (Signed)
 [facility] at [facility] [address]  [place], [place] [address] [phone]   Interval Evaluation  Date of Service: 04/03/24 Patient Name: Kevin Hunter Patient MRN: [id number] Patient DOB: [birth year] Provider: Arthea MARLA Manns, MD  Identifying Statement:  Kevin Hunter is a 58 y.o. male with Non-small cell lung cancer metastatic to brain [facility])   Primary Cancer:  Oncologic History: Oncology History  Lung cancer, primary, with metastasis from lung to other site, right (HCC)  05/27/2021 Initial Diagnosis   Lung cancer, primary, with metastasis from lung to other site, right [facility])   05/27/2021 Cancer Staging   Staging form: Lung, AJCC 8th Edition - Clinical stage from 05/27/2021: Stage IV (cT3, cN2, pM1) - Signed by Timmy Maude SAUNDERS, MD on 05/27/2021 Histologic grade (G): G2 Histologic grading system: 4 grade system   06/25/2021 - 07/14/2021 Chemotherapy   Patient is on Treatment Plan : LUNG NSCLC SQUAMOUS Nivolumab  + Ipilimumab  + Carboplatin  + Paclitaxel  q42d X 1 cycle / Nivolumab  + Ipilimumab  q42d     07/22/2021 - 10/16/2021 Chemotherapy   Patient is on Treatment Plan : LUNG Carboplatin  / Docetaxel  q21d     07/22/2021 - 03/17/2022 Chemotherapy   Patient is on Treatment Plan : LUNG Nivolumab  q21d     07/22/2021 -  Chemotherapy   Patient is on Treatment Plan : LUNG Nivolumab  (240) q14d/Nivolumab  480 mg q28d      CNS Oncologic History 06/12/21: SRS x3 [facility])  Interval History: Kevin Hunter presents today for follow up after recent MRI brain.  No new or progressive changes today.  Denies speech issues.  Remains off decadron .  Continues on opdivo  q4 weeks with Dr. Timmy, now having completed 37 cycles.    H+P (02/08/22) Patient presents today for follow up after recent neurologic symptoms.  He describes numbness and weakness of his left arm and leg, progressive over several weeks.  He began dragging the left leg, interfering with ambulation.   Also describes issues with cognition, short term memory.  Morning headaches and blurry vision have accompanied these changes.  Decadron  was started late last week, this has led to significantly improved symptom burden.  He feels close to his baseline at this time.  Continues on opdivo  with Dr. Timmy.    Medications: Current Outpatient Medications on File Prior to Visit  Medication Sig Dispense Refill   amLODipine  (NORVASC ) 5 MG tablet Take 5 mg by mouth at bedtime.     clonazePAM  (KLONOPIN ) 1 MG tablet Take 1 mg by mouth 2 (two) times daily as needed for anxiety (May take additional 0.5mg  as needed for panic attack).     levothyroxine  (SYNTHROID ) 50 MCG tablet TAKE 1 TABLET BY MOUTH DAILY BEFORE BREAKFAST 90 tablet 1   pantoprazole  (PROTONIX ) 40 MG tablet Take 1 tablet (40 mg total) by mouth daily. 60 tablet 6   potassium chloride  SA (KLOR-CON  M) 20 MEQ tablet TAKE 1 TABLET BY MOUTH EVERY DAY 90 tablet 4   QUEtiapine  (SEROQUEL  XR) 400 MG 24 hr tablet Take 400 mg by mouth at bedtime.     sildenafil (REVATIO) 20 MG tablet Take 20-100 mg by mouth daily as needed.     solifenacin (VESICARE) 10 MG tablet Take 10 mg by mouth daily.     traZODone  (DESYREL ) 100 MG tablet Take 300 mg by mouth at bedtime as needed for sleep.     venlafaxine  (EFFEXOR ) 75 MG tablet Take 225 mg by mouth at bedtime.     [  DISCONTINUED] prochlorperazine  (COMPAZINE ) 10 MG tablet Take 1 tablet (10 mg total) by mouth every 6 (six) hours as needed (Nausea or vomiting). 30 tablet 1   No current facility-administered medications on file prior to visit.    Allergies:  Allergies  Allergen Reactions   Paclitaxel  Anaphylaxis   Penicillins Other (See Comments)    Unknown Reaction when he was an infant.   Past Medical History:  Past Medical History:  Diagnosis Date   Anxiety    Bipolar 1 disorder (HCC)    Goals of care, counseling/discussion 05/27/2021   Hypertension    Lung cancer, primary, with metastasis from lung to  other site, right (HCC) 05/27/2021   Mood disorder (HCC)    Sleep apnea    Past Surgical History:  Past Surgical History:  Procedure Laterality Date   BRONCHIAL BIOPSY  05/22/2021   Procedure: BRONCHIAL BIOPSIES;  Surgeon: Brenna Adine CROME, DO;  Location: [facility];  Service: Pulmonary;;   BRONCHIAL BRUSHINGS  05/22/2021   Procedure: BRONCHIAL BRUSHINGS;  Surgeon: Brenna Adine CROME, DO;  Location: [facility];  Service: Pulmonary;;   BRONCHIAL NEEDLE ASPIRATION BIOPSY  05/22/2021   Procedure: BRONCHIAL NEEDLE ASPIRATION BIOPSIES;  Surgeon: Brenna Adine CROME, DO;  Location: [facility];  Service: Pulmonary;;   IR IMAGING GUIDED PORT INSERTION  06/24/2021   VIDEO BRONCHOSCOPY WITH ENDOBRONCHIAL NAVIGATION N/A 05/22/2021   Procedure: VIDEO BRONCHOSCOPY WITH ENDOBRONCHIAL NAVIGATION;  Surgeon: Brenna Adine CROME, DO;  Location: [facility];  Service: Pulmonary;  Laterality: N/A;   VIDEO BRONCHOSCOPY WITH RADIAL ENDOBRONCHIAL ULTRASOUND  05/22/2021   Procedure: RADIAL ENDOBRONCHIAL ULTRASOUND;  Surgeon: Brenna Adine CROME, DO;  Location: [facility];  Service: Pulmonary;;   Social History:  Social History   Socioeconomic History   Marital status: Single    Spouse name: Not on file   Number of children: Not on file   Years of education: Not on file   Highest education level: Not on file  Occupational History   Not on file  Tobacco Use   Smoking status: Former    Current packs/day: 0.00    Average packs/day: 1.5 packs/day for 30.0 years (45.0 ttl pk-yrs)    Types: Cigarettes, E-cigarettes    Start date: 05/16/1988    Quit date: 05/16/2018    Years since quitting: 5.8   Smokeless tobacco: Never  Vaping Use   Vaping status: Never Used  Substance and Sexual Activity   Alcohol use: Never   Drug use: Never   Sexual activity: Yes  Other Topics Concern   Not on file  Social History Narrative   Not on file   Social Drivers of Health   Financial Resource Strain: Low Risk  (09/22/2022)    Overall Financial Resource Strain (CARDIA)    Difficulty of Paying Living Expenses: Not hard at all  Food Insecurity: No Food Insecurity (09/22/2022)   Hunger Vital Sign    Worried About Running Out of Food in the Last Year: Never true    Ran Out of Food in the Last Year: Never true  Transportation Needs: No Transportation Needs (09/22/2022)   PRAPARE - Administrator, Civil Service (Medical): No    Lack of Transportation (Non-Medical): No  Physical Activity: Not on file  Stress: Not on file  Social Connections: Not on file  Intimate Partner Violence: Not At Risk (05/28/2021)   Humiliation, Afraid, Rape, and Kick questionnaire    Fear of Current or Ex-Partner: No    Emotionally Abused: No  Physically Abused: No    Sexually Abused: No   Family History:  Family History  Problem Relation Age of Onset   Breast cancer Mother    Lung cancer Father     Review of Systems: Constitutional: Doesn't report fevers, chills or abnormal weight loss Eyes: Doesn't report blurriness of vision Ears, nose, mouth, throat, and face: Doesn't report sore throat Respiratory: Doesn't report cough, dyspnea or wheezes Cardiovascular: Doesn't report palpitation, chest discomfort  Gastrointestinal:  Doesn't report nausea, constipation, diarrhea GU: Doesn't report incontinence Skin: Doesn't report skin rashes Neurological: Per HPI Musculoskeletal: Doesn't report joint pain Behavioral/Psych: Doesn't report anxiety  Physical Exam: There were no vitals filed for this visit.   KPS: 90. General: Alert, cooperative, pleasant, in no acute distress Head: Normal EENT: No conjunctival injection or scleral icterus.  Lungs: Resp effort normal Cardiac: Regular rate Abdomen: Non-distended abdomen Skin: No rashes cyanosis or petechiae. Extremities: No clubbing or edema  Neurologic Exam: Mental Status: Awake, alert, attentive to examiner. Oriented to self and environment. Language is fluent with  intact comprehension.  Cranial Nerves: Visual acuity is grossly normal. Visual fields are full. Extra-ocular movements intact. No ptosis. Face is symmetric Motor: Tone and bulk are normal. Power is 5/5 in left arm and leg. Reflexes are symmetric, no pathologic reflexes present.  Sensory: Intact to light touch Gait: Independent   Labs: I have reviewed the data as listed    Component Value Date/Time   NA 140 03/16/2024 0751   K 3.4 (L) 03/16/2024 0751   CL 103 03/16/2024 0751   CO2 25 03/16/2024 0751   GLUCOSE 127 (H) 03/16/2024 0751   BUN 11 03/16/2024 0751   CREATININE 1.16 03/16/2024 0751   CALCIUM 8.7 (L) 03/16/2024 0751   PROT 7.1 03/16/2024 0751   ALBUMIN 4.3 03/16/2024 0751   AST 21 03/16/2024 0751   ALT 21 03/16/2024 0751   ALKPHOS 68 03/16/2024 0751   BILITOT 0.3 03/16/2024 0751   GFRNONAA >60 03/16/2024 0751   Lab Results  Component Value Date   WBC 6.6 03/16/2024   NEUTROABS 4.7 03/16/2024   HGB 14.8 03/16/2024   HCT 42.8 03/16/2024   MCV 91.8 03/16/2024   PLT 170 03/16/2024    Imaging:  [facility] Clinician Interpretation: I have personally reviewed the CNS images as listed.  My interpretation, in the context of the patient's clinical presentation, is stable disease pending official read  MR Brain W Wo Contrast Result Date: 03/29/2024 EXAM: MRI BRAIN WITH AND WITHOUT CONTRAST 03/29/2024 02:48:33 PM TECHNIQUE: Multiplanar multisequence MRI of the head/brain was performed with and without the administration of intravenous contrast. COMPARISON: MRI 09/29/2023 CLINICAL HISTORY: Brain/CNS neoplasm, monitor. Brain/CNS neoplasm, monitor; Dx: Metastasis to brain FINDINGS: BRAIN AND VENTRICLES: No acute infarct. No acute intracranial hemorrhage. No mass effect or midline shift. No hydrocephalus. Normal flow voids. New lesions: None. Larger lesions: None. Stable or smaller lesions: 1. 10 x 8 mm enhancing lesion in the high posterior right frontal lobe, slightly decreased in size  from prior with stable to slightly decreased, mild edema (series 800 image 246). 2. 20 x 16 mm enhancing lesion in the left parietal lobe, slightly decreased in size from prior with decreased, mild-to-moderate residual edema (series 800 image 200). 3. 10 x 9 mm enhancing lesion in the anterior left frontal lobe, decreased in size from prior with resolved edema (series 800 image 202). Chronic blood products are again noted associated with the 3 treated lesions. There is mild-to-moderate cerebral atrophy. ORBITS: No acute  abnormality. SINUSES: No acute abnormality. BONES AND SOFT TISSUES: Unchanged 1.5 cm enhancing skull lesion at the right frontoparietal vertex. No acute soft tissue abnormality. IMPRESSION: 1. Decreased size of the 3 treated brain metastases with decreased edema. 2. No new brain metastases. Electronically signed by: Dasie Hamburg MD 03/29/2024 03:35 PM EDT RP Workstation: [id number]     Assessment/Plan Non-small cell lung cancer metastatic to brain [facility])  Norleen JONETTA Browner is clinically stable today, now new or progressive changes.  MRI brain demonstrates overall stable findings without post-RT treatment effect.  Decadron  should remain off, if tolerated.  Dr. Timmy will con't to follow.  We ask that Norleen JONETTA Browner return to clinic in 9 months with MRI brain, or sooner as needed.  All questions were answered. The patient knows to call the clinic with any problems, questions or concerns. No barriers to learning were detected.  The total time spent in the encounter was 30 minutes and more than 50% was on counseling and review of test results   Arthea MARLA Manns, MD Medical Director of Neuro-Oncology [facility] at [facility] 04/03/24 11:11 AM

## 2024-04-04 ENCOUNTER — Telehealth: Payer: Self-pay | Admitting: Internal Medicine

## 2024-04-04 NOTE — Telephone Encounter (Signed)
 Scheduled appointment per 8/19 los. Called and left a VM with appointment details for the patient.

## 2024-04-05 ENCOUNTER — Other Ambulatory Visit: Payer: Self-pay | Admitting: Radiation Therapy

## 2024-04-05 ENCOUNTER — Other Ambulatory Visit: Payer: Self-pay

## 2024-04-06 ENCOUNTER — Encounter: Payer: Self-pay | Admitting: Hematology & Oncology

## 2024-04-09 ENCOUNTER — Encounter: Payer: Self-pay | Admitting: Hematology & Oncology

## 2024-04-09 ENCOUNTER — Encounter (HOSPITAL_COMMUNITY)
Admission: RE | Admit: 2024-04-09 | Discharge: 2024-04-09 | Disposition: A | Discharge status: Home / Self Care | Payer: Self-pay | Source: Ambulatory Visit | Attending: Hematology & Oncology | Admitting: Hematology & Oncology

## 2024-04-09 DIAGNOSIS — C3490 Malignant neoplasm of unspecified part of unspecified bronchus or lung: Secondary | ICD-10-CM | POA: Insufficient documentation

## 2024-04-09 DIAGNOSIS — C3491 Malignant neoplasm of unspecified part of right bronchus or lung: Secondary | ICD-10-CM | POA: Insufficient documentation

## 2024-04-09 DIAGNOSIS — C7931 Secondary malignant neoplasm of brain: Secondary | ICD-10-CM | POA: Insufficient documentation

## 2024-04-09 LAB — GLUCOSE, CAPILLARY: Glucose-Capillary: 99 mg/dL (ref 70–99)

## 2024-04-09 MED ORDER — FLUDEOXYGLUCOSE F - 18 (FDG) INJECTION
14.2900 | Freq: Once | INTRAVENOUS | Status: AC
  Administered 2024-04-09: 14.29 via INTRAVENOUS

## 2024-04-13 ENCOUNTER — Other Ambulatory Visit

## 2024-04-13 ENCOUNTER — Ambulatory Visit: Admitting: Hematology & Oncology

## 2024-04-13 ENCOUNTER — Ambulatory Visit

## 2024-04-16 ENCOUNTER — Encounter: Payer: Self-pay | Admitting: Hematology & Oncology

## 2024-04-17 ENCOUNTER — Ambulatory Visit: Payer: Self-pay | Admitting: Hematology & Oncology

## 2024-04-20 ENCOUNTER — Inpatient Hospital Stay: Payer: Self-pay | Attending: Internal Medicine

## 2024-04-20 ENCOUNTER — Inpatient Hospital Stay: Payer: Self-pay

## 2024-04-20 ENCOUNTER — Encounter: Payer: Self-pay | Admitting: Hematology & Oncology

## 2024-04-20 ENCOUNTER — Ambulatory Visit: Payer: Self-pay | Admitting: Hematology & Oncology

## 2024-04-20 VITALS — BP 126/92 | HR 65 | Temp 97.8°F | Resp 18 | Ht 71.0 in | Wt 288.1 lb

## 2024-04-20 VITALS — BP 148/84 | HR 59

## 2024-04-20 DIAGNOSIS — Z5112 Encounter for antineoplastic immunotherapy: Secondary | ICD-10-CM | POA: Insufficient documentation

## 2024-04-20 DIAGNOSIS — Z79620 Long term (current) use of immunosuppressive biologic: Secondary | ICD-10-CM | POA: Insufficient documentation

## 2024-04-20 DIAGNOSIS — C3491 Malignant neoplasm of unspecified part of right bronchus or lung: Secondary | ICD-10-CM

## 2024-04-20 DIAGNOSIS — C7970 Secondary malignant neoplasm of unspecified adrenal gland: Secondary | ICD-10-CM | POA: Insufficient documentation

## 2024-04-20 DIAGNOSIS — C3411 Malignant neoplasm of upper lobe, right bronchus or lung: Secondary | ICD-10-CM | POA: Insufficient documentation

## 2024-04-20 DIAGNOSIS — C7931 Secondary malignant neoplasm of brain: Secondary | ICD-10-CM | POA: Diagnosis not present

## 2024-04-20 LAB — CBC WITH DIFFERENTIAL (CANCER CENTER ONLY)
Abs Immature Granulocytes: 0.05 K/uL (ref 0.00–0.07)
Basophils Absolute: 0.1 K/uL (ref 0.0–0.1)
Basophils Relative: 1 %
Eosinophils Absolute: 0.2 K/uL (ref 0.0–0.5)
Eosinophils Relative: 2 %
HCT: 43.7 % (ref 39.0–52.0)
Hemoglobin: 15.4 g/dL (ref 13.0–17.0)
Immature Granulocytes: 1 %
Lymphocytes Relative: 18 %
Lymphs Abs: 1.4 K/uL (ref 0.7–4.0)
MCH: 32.3 pg (ref 26.0–34.0)
MCHC: 35.2 g/dL (ref 30.0–36.0)
MCV: 91.6 fL (ref 80.0–100.0)
Monocytes Absolute: 0.6 K/uL (ref 0.1–1.0)
Monocytes Relative: 8 %
Neutro Abs: 5.5 K/uL (ref 1.7–7.7)
Neutrophils Relative %: 70 %
Platelet Count: 183 K/uL (ref 150–400)
RBC: 4.77 MIL/uL (ref 4.22–5.81)
RDW: 14.1 % (ref 11.5–15.5)
WBC Count: 7.8 K/uL (ref 4.0–10.5)
nRBC: 0 % (ref 0.0–0.2)

## 2024-04-20 LAB — CMP (CANCER CENTER ONLY)
ALT: 20 U/L (ref 0–44)
AST: 21 U/L (ref 15–41)
Albumin: 4.5 g/dL (ref 3.5–5.0)
Alkaline Phosphatase: 73 U/L (ref 38–126)
Anion gap: 11 (ref 5–15)
BUN: 10 mg/dL (ref 6–20)
CO2: 26 mmol/L (ref 22–32)
Calcium: 8.9 mg/dL (ref 8.9–10.3)
Chloride: 101 mmol/L (ref 98–111)
Creatinine: 1.05 mg/dL (ref 0.61–1.24)
GFR, Estimated: >60 mL/min (ref >60)
Glucose, Bld: 89 mg/dL (ref 70–99)
Potassium: 3.4 mmol/L — ABNORMAL LOW (ref 3.5–5.1)
Sodium: 138 mmol/L (ref 135–145)
Total Bilirubin: 0.3 mg/dL (ref 0.0–1.2)
Total Protein: 7.5 g/dL (ref 6.5–8.1)

## 2024-04-20 LAB — TSH: TSH: 2.56 u[IU]/mL (ref 0.350–4.500)

## 2024-04-20 LAB — LACTATE DEHYDROGENASE: LDH: 176 U/L (ref 98–192)

## 2024-04-20 MED ORDER — SODIUM CHLORIDE 0.9 % IV SOLN
480.0000 mg | Freq: Once | INTRAVENOUS | Status: AC
  Administered 2024-04-20: 480 mg via INTRAVENOUS
  Filled 2024-04-20: qty 48

## 2024-04-20 MED ORDER — SODIUM CHLORIDE 0.9 % IV SOLN: Freq: Once | INTRAVENOUS | Status: AC

## 2024-04-20 NOTE — Progress Notes (Signed)
 Hematology and Oncology Follow Up Visit  Kevin Hunter [id number] [birth year] 58 y.o. 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 is here today for follow-up and treatment.  We did do a PET scan on him.  The PET scan was done on 04/09/2024.  There is no evidence of obvious recurrence of his metastatic disease.  There was a left-sided cervical lymph node with low-level activity which we will have to follow.  He is having some numbness in the bottom of his right foot.  I had believe this might be coming from when he had treatment for his CNS metastasis.  His last MRI of the brain showed that everything was decreasing in the brain.  He is working.  He is having no problems with fatigue or weakness.  He did not sleep well at all last night.  He has been up since 12:30 AM.  He has had a good appetite.  He has had no nausea or vomiting.  He has had no rashes.  He has had no change in bowel or bladder habits.  His last TSH back in August was 2.3.  Overall, I would say that his performance status is probably ECOG 0.  Medications:  Allergies as of 04/20/2024       Reactions   Paclitaxel  Anaphylaxis   Penicillins Other (See Comments)   Unknown Reaction when he was an infant.        Medication List        Accurate as of April 20, 2024  1:38 PM. If you have any questions, ask your nurse or doctor.          amLODipine  5 MG tablet Commonly known as: NORVASC  Take 5 mg by mouth at bedtime.   clonazePAM  1 MG tablet Commonly known as:  KLONOPIN  Take 1 mg by mouth 2 (two) times daily as needed for anxiety (May take additional 0.5mg  as needed for panic attack).   levothyroxine  50 MCG tablet Commonly known as: SYNTHROID  TAKE 1 TABLET BY MOUTH DAILY BEFORE BREAKFAST   pantoprazole  40 MG tablet Commonly known as: PROTONIX  Take 1 tablet (40 mg total) by mouth daily.   potassium chloride  SA 20 MEQ tablet Commonly known as: KLOR-CON  M TAKE 1 TABLET BY MOUTH EVERY DAY   QUEtiapine  400 MG 24 hr tablet Commonly known as: SEROQUEL  XR Take 400 mg by mouth at bedtime.   sildenafil 20 MG tablet Commonly known as: REVATIO Take 20-100 mg by mouth daily as needed.   solifenacin 10 MG tablet Commonly known as: VESICARE Take 10 mg by mouth daily.   traZODone  100 MG tablet Commonly known as: DESYREL  Take 300 mg by mouth at bedtime as needed for sleep.   venlafaxine  75 MG tablet Commonly known as: EFFEXOR  Take 225 mg by mouth at bedtime.        Allergies:  Allergies  Allergen Reactions   Paclitaxel  Anaphylaxis   Penicillins Other (See Comments)    Unknown Reaction when he was an infant.    Past Medical History,  Surgical history, Social history, and Family History were reviewed and updated.  Review of Systems: Review of Systems  Constitutional: Negative.   HENT: Negative.    Eyes: Negative.   Respiratory: Negative.    Cardiovascular: Negative.   Gastrointestinal: Negative.   Genitourinary: Negative.   Musculoskeletal: Negative.   Skin: Negative.   Neurological:  Positive for tingling.  Endo/Heme/Allergies: Negative.   Psychiatric/Behavioral: Negative.        Physical Exam: Vital signs are a temperature 97.8.  Pulse 65.  Blood pressure 126/92.  Weight is 288 pounds.      Wt Readings from Last 3 Encounters:  04/20/24 288 lb 1.6 oz (130.7 kg)  04/03/24 289 lb 6 oz (131.3 kg)  03/16/24 286 lb (129.7 kg)    Physical Exam Vitals reviewed.  HENT:     Head: Normocephalic and atraumatic.  Eyes:      Pupils: Pupils are equal, round, and reactive to light.  Cardiovascular:     Rate and Rhythm: Normal rate and regular rhythm.     Heart sounds: Normal heart sounds.     Comments: Cardiac exam is markedly tachycardic but regular.  He has no murmurs, rubs or bruits. Pulmonary:     Effort: Pulmonary effort is normal.     Breath sounds: Normal breath sounds.     Comments: His lungs sound clear bilaterally.  I do not hear any wheezing.  There is no rhonchi. Abdominal:     General: Bowel sounds are normal.     Palpations: Abdomen is soft.  Musculoskeletal:        General: No tenderness or deformity. Normal range of motion.     Cervical back: Normal range of motion.  Lymphadenopathy:     Cervical: No cervical adenopathy.  Skin:    General: Skin is warm and dry.     Findings: No erythema or rash.  Neurological:     Mental Status: He is alert and oriented to person, place, and time.  Psychiatric:        Behavior: Behavior normal.        Thought Content: Thought content normal.        Judgment: Judgment normal.     Lab Results  Component Value Date   WBC 7.8 04/20/2024   HGB 15.4 04/20/2024   HCT 43.7 04/20/2024   MCV 91.6 04/20/2024   PLT 183 04/20/2024   No results found for: FERRITIN, IRON, TIBC, UIBC, IRONPCTSAT Lab Results  Component Value Date   RBC 4.77 04/20/2024   No results found for: KPAFRELGTCHN, LAMBDASER, KAPLAMBRATIO No results found for: IGGSERUM, IGA, IGMSERUM No results found for: STEPHANY CARLOTA BENSON MARKEL EARLA JOANNIE DOC VICK, SPEI   Chemistry      Component Value Date/Time   NA 138 04/20/2024 1134   K 3.4 (L) 04/20/2024 1134   CL 101 04/20/2024 1134   CO2 26 04/20/2024 1134   BUN 10 04/20/2024 1134   CREATININE 1.05 04/20/2024 1134      Component Value Date/Time   CALCIUM 8.9 04/20/2024 1134   ALKPHOS 73 04/20/2024 1134   AST 21 04/20/2024 1134   ALT 20 04/20/2024 1134   BILITOT 0.3  04/20/2024 1134       Impression and Plan: Mr. 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 He completed his chemotherapy along with immunotherapy.   He now is on maintenance therapy with nivolumab  and tolerating well with a nice  response.  So far, we have not seen any evidence of recurrent or progressive disease.  I do not think we need another PET scan until the end of the year.  I am just happy that everything is going so well for him.  If this numbness in the right foot does not improve, we may need to do another MRI of the brain.  We will plan to get him back in 1 month.   9/5/20251:38 PM

## 2024-04-20 NOTE — Patient Instructions (Signed)

## 2024-04-20 NOTE — Progress Notes (Signed)
 BP elevated today. Pt. denied symptoms. Encouraged to check BP and record. Report any symptoms to PCP.  Tolerated tmt well today.

## 2024-04-20 NOTE — Patient Instructions (Signed)
 [facility] - A DEPT OF [facility]  Discharge Instructions: Thank you for choosing [facility] to provide your oncology and hematology care.   If you have a lab appointment with the Cancer Center, please go directly to the Cancer Center and check in at the registration area.  Wear comfortable clothing and clothing appropriate for easy access to any Portacath or PICC line.   We strive to give you quality time with your provider. You may need to reschedule your appointment if you arrive late (15 or more minutes).  Arriving late affects you and other patients whose appointments are after yours.  Also, if you miss three or more appointments without notifying the office, you may be dismissed from the clinic at the provider's discretion.      For prescription refill requests, have your pharmacy contact our office and allow 72 hours for refills to be completed.    Today you received the following chemotherapy and/or immunotherapy agents Nivolumab    To help prevent nausea and vomiting after your treatment, we encourage you to take your nausea medication as directed.  BELOW ARE SYMPTOMS THAT SHOULD BE REPORTED IMMEDIATELY: *FEVER GREATER THAN 100.4 F (38 C) OR HIGHER *CHILLS OR SWEATING *NAUSEA AND VOMITING THAT IS NOT CONTROLLED WITH YOUR NAUSEA MEDICATION *UNUSUAL SHORTNESS OF BREATH *UNUSUAL BRUISING OR BLEEDING *URINARY PROBLEMS (pain or burning when urinating, or frequent urination) *BOWEL PROBLEMS (unusual diarrhea, constipation, pain near the anus) TENDERNESS IN MOUTH AND THROAT WITH OR WITHOUT PRESENCE OF ULCERS (sore throat, sores in mouth, or a toothache) UNUSUAL RASH, SWELLING OR PAIN  UNUSUAL VAGINAL DISCHARGE OR ITCHING   Items with * indicate a potential emergency and should be followed up as soon as possible or go to the Emergency Department if any problems should occur.  Please show the CHEMOTHERAPY ALERT CARD or IMMUNOTHERAPY  ALERT CARD at check-in to the Emergency Department and triage nurse. Should you have questions after your visit or need to cancel or reschedule your appointment, please contact [facility] - A DEPT OF [facility]  [phone] and follow the prompts.  Office hours are 8:00 a.m. to 4:30 p.m. Monday - Friday. Please note that voicemails left after 4:00 p.m. may not be returned until the following business day.  We are closed weekends and major holidays. You have access to a nurse at all times for urgent questions. Please call the main number to the clinic [phone] and follow the prompts.  For any non-urgent questions, you may also contact your provider using MyChart. We now offer e-Visits for anyone 14 and older to request care online for non-urgent symptoms. For details visit [email or web address].   Also download the MyChart app! Go to the app store, search "MyChart", open the app, select [place], and log in with your MyChart username and password.

## 2024-04-20 NOTE — Progress Notes (Signed)
 Per Dr. Timmy ok to proceed with treatment today regardless of blood pressure.

## 2024-04-21 LAB — T4: T4, Total: 7 ug/dL (ref 4.5–12.0)

## 2024-04-27 ENCOUNTER — Other Ambulatory Visit: Payer: Self-pay | Admitting: Radiation Therapy

## 2024-05-01 ENCOUNTER — Encounter: Payer: Self-pay | Admitting: Hematology & Oncology

## 2024-05-18 ENCOUNTER — Inpatient Hospital Stay

## 2024-05-18 ENCOUNTER — Inpatient Hospital Stay: Attending: Internal Medicine

## 2024-05-18 ENCOUNTER — Inpatient Hospital Stay: Admitting: Hematology & Oncology

## 2024-05-18 ENCOUNTER — Inpatient Hospital Stay: Admitting: Family

## 2024-05-18 VITALS — BP 133/87 | HR 66 | Temp 97.8°F | Resp 18 | Ht 70.0 in | Wt 290.1 lb

## 2024-05-18 DIAGNOSIS — C7931 Secondary malignant neoplasm of brain: Secondary | ICD-10-CM | POA: Diagnosis not present

## 2024-05-18 DIAGNOSIS — C7970 Secondary malignant neoplasm of unspecified adrenal gland: Secondary | ICD-10-CM | POA: Insufficient documentation

## 2024-05-18 DIAGNOSIS — Z79620 Long term (current) use of immunosuppressive biologic: Secondary | ICD-10-CM | POA: Insufficient documentation

## 2024-05-18 DIAGNOSIS — C3490 Malignant neoplasm of unspecified part of unspecified bronchus or lung: Secondary | ICD-10-CM

## 2024-05-18 DIAGNOSIS — C3491 Malignant neoplasm of unspecified part of right bronchus or lung: Secondary | ICD-10-CM | POA: Diagnosis not present

## 2024-05-18 DIAGNOSIS — C3411 Malignant neoplasm of upper lobe, right bronchus or lung: Secondary | ICD-10-CM | POA: Insufficient documentation

## 2024-05-18 DIAGNOSIS — Z5112 Encounter for antineoplastic immunotherapy: Secondary | ICD-10-CM | POA: Insufficient documentation

## 2024-05-18 LAB — CBC WITH DIFFERENTIAL (CANCER CENTER ONLY)
Abs Immature Granulocytes: 0.07 K/uL (ref 0.00–0.07)
Basophils Absolute: 0.1 K/uL (ref 0.0–0.1)
Basophils Relative: 1 %
Eosinophils Absolute: 0.2 K/uL (ref 0.0–0.5)
Eosinophils Relative: 3 %
HCT: 42.8 % (ref 39.0–52.0)
Hemoglobin: 14.6 g/dL (ref 13.0–17.0)
Immature Granulocytes: 1 %
Lymphocytes Relative: 17 %
Lymphs Abs: 1.4 K/uL (ref 0.7–4.0)
MCH: 31.5 pg (ref 26.0–34.0)
MCHC: 34.1 g/dL (ref 30.0–36.0)
MCV: 92.2 fL (ref 80.0–100.0)
Monocytes Absolute: 0.6 K/uL (ref 0.1–1.0)
Monocytes Relative: 8 %
Neutro Abs: 5.6 K/uL (ref 1.7–7.7)
Neutrophils Relative %: 70 %
Platelet Count: 167 K/uL (ref 150–400)
RBC: 4.64 MIL/uL (ref 4.22–5.81)
RDW: 14.3 % (ref 11.5–15.5)
WBC Count: 7.9 K/uL (ref 4.0–10.5)
nRBC: 0 % (ref 0.0–0.2)

## 2024-05-18 LAB — CMP (CANCER CENTER ONLY)
ALT: 25 U/L (ref 0–44)
AST: 21 U/L (ref 15–41)
Albumin: 4.1 g/dL (ref 3.5–5.0)
Alkaline Phosphatase: 77 U/L (ref 38–126)
Anion gap: 10 (ref 5–15)
BUN: 13 mg/dL (ref 6–20)
CO2: 26 mmol/L (ref 22–32)
Calcium: 8.6 mg/dL — ABNORMAL LOW (ref 8.9–10.3)
Chloride: 103 mmol/L (ref 98–111)
Creatinine: 1.03 mg/dL (ref 0.61–1.24)
GFR, Estimated: >60 mL/min (ref >60)
Glucose, Bld: 129 mg/dL — ABNORMAL HIGH (ref 70–99)
Potassium: 3.2 mmol/L — ABNORMAL LOW (ref 3.5–5.1)
Sodium: 139 mmol/L (ref 135–145)
Total Bilirubin: 0.4 mg/dL (ref 0.0–1.2)
Total Protein: 6.9 g/dL (ref 6.5–8.1)

## 2024-05-18 LAB — TSH: TSH: 2.11 u[IU]/mL (ref 0.350–4.500)

## 2024-05-18 MED ORDER — SODIUM CHLORIDE 0.9 % IV SOLN
480.0000 mg | Freq: Once | INTRAVENOUS | Status: AC
  Administered 2024-05-18: 480 mg via INTRAVENOUS
  Filled 2024-05-18: qty 48

## 2024-05-18 MED ORDER — SODIUM CHLORIDE 0.9 % IV SOLN: Freq: Once | INTRAVENOUS | Status: AC

## 2024-05-18 NOTE — Progress Notes (Signed)
 Hematology and Oncology Follow Up Visit  Kevin Hunter [id number] [birth year] 58 y.o. 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 He is doing quite well and has no complaints at this time.  He is still working and staying busy.  No issue with abnormal fatigue.  TSH and T4 have remained stable.  He is taking his potassium supplement daily. We will have him increase to 40 meq daily for a week to improve level from 3.2.  No fever, chills, n/v, cough, rash, dizziness, SOB, chest pain, palpitations, abdominal pain or changes in bowel or bladder habits.  No swelling, tenderness, numbness or tingling in her extremities at this time. The neuropathy has resolved since his last visit.  No falls or syncope.  Appetite and hydration are good. Weight is stable at 290 lbs.   ECOG Performance Status: 1 - Symptomatic but completely ambulatory  Medications:  Allergies as of 05/18/2024       Reactions   Paclitaxel  Anaphylaxis   Penicillins Other (See Comments)   Unknown Reaction when he was an infant.        Medication List        Accurate as of May 18, 2024  9:29 AM. If you have any questions, ask your nurse or doctor.          amLODipine  5 MG tablet Commonly known as: NORVASC  Take 5 mg by mouth at bedtime.   clonazePAM  1 MG tablet Commonly known as: KLONOPIN  Take 1 mg by mouth 2 (two) times daily as needed for anxiety (May take additional 0.5mg  as needed for panic attack).   levothyroxine  50 MCG  tablet Commonly known as: SYNTHROID  TAKE 1 TABLET BY MOUTH DAILY BEFORE BREAKFAST   pantoprazole  40 MG tablet Commonly known as: PROTONIX  Take 1 tablet (40 mg total) by mouth daily.   potassium chloride  SA 20 MEQ tablet Commonly known as: KLOR-CON  M TAKE 1 TABLET BY MOUTH EVERY DAY   QUEtiapine  400 MG 24 hr tablet Commonly known as: SEROQUEL  XR Take 400 mg by mouth at bedtime.   sildenafil 20 MG tablet Commonly known as: REVATIO Take 20-100 mg by mouth daily as needed.   solifenacin 10 MG tablet Commonly known as: VESICARE Take 10 mg by mouth daily.   traZODone  100 MG tablet Commonly known as: DESYREL  Take 300 mg by mouth at bedtime as needed for sleep.   venlafaxine  75 MG tablet Commonly known as: EFFEXOR  Take 225 mg by mouth at bedtime.        Allergies:  Allergies  Allergen Reactions   Paclitaxel  Anaphylaxis   Penicillins Other (See Comments)    Unknown Reaction when he was an infant.    Past Medical History, Surgical history, Social history, and Family History were reviewed and updated.  Review of Systems: All other 10 point review of systems is negative.   Physical Exam:  height is 5' 10 (1.778 m) and weight is 290 lb 1.9 oz (131.6 kg). His oral temperature is 97.8  F (36.6 C). His blood pressure is 133/87 and his pulse is 66. His respiration is 18 and oxygen saturation is 96%.   Wt Readings from Last 3 Encounters:  05/18/24 290 lb 1.9 oz (131.6 kg)  04/20/24 288 lb 1.6 oz (130.7 kg)  04/03/24 289 lb 6 oz (131.3 kg)    Ocular: Sclerae unicteric, pupils equal, round and reactive to light Ear-nose-throat: Oropharynx clear, dentition fair Lymphatic: No cervical or supraclavicular adenopathy Lungs no rales or rhonchi, good excursion bilaterally Heart regular rate and rhythm, no murmur appreciated Abd soft, nontender, positive bowel sounds MSK no focal spinal tenderness, no joint edema Neuro: non-focal, well-oriented, appropriate affect Breasts:  Deferred   Lab Results  Component Value Date   WBC 7.9 05/18/2024   HGB 14.6 05/18/2024   HCT 42.8 05/18/2024   MCV 92.2 05/18/2024   PLT 167 05/18/2024   No results found for: FERRITIN, IRON, TIBC, UIBC, IRONPCTSAT Lab Results  Component Value Date   RBC 4.64 05/18/2024   No results found for: KPAFRELGTCHN, LAMBDASER, KAPLAMBRATIO No results found for: IGGSERUM, IGA, IGMSERUM No results found for: STEPHANY CARLOTA BENSON MARKEL EARLA JOANNIE DOC VICK, SPEI   Chemistry      Component Value Date/Time   NA 139 05/18/2024 0819   K 3.2 (L) 05/18/2024 0819   CL 103 05/18/2024 0819   CO2 26 05/18/2024 0819   BUN 13 05/18/2024 0819   CREATININE 1.03 05/18/2024 0819      Component Value Date/Time   CALCIUM 8.6 (L) 05/18/2024 0819   ALKPHOS 77 05/18/2024 0819   AST 21 05/18/2024 0819   ALT 25 05/18/2024 0819   BILITOT 0.4 05/18/2024 0819       Impression and Plan: Kevin Hunter is a very pleasant 58 yo caucasian gentleman with 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 He completed his chemotherapy along with immunotherapy.  He now is on maintenance therapy with nivolumab  and tolerating well with a nice response on recent PET scan. We will proceed with treatment today as planned.  Follow-up in 1 month.   Lauraine Pepper, NP 10/3/20259:29 AM

## 2024-05-18 NOTE — Patient Instructions (Signed)

## 2024-05-18 NOTE — Patient Instructions (Signed)
 [facility] A DEPT OF [facility]  [facility]  Discharge Instructions: Thank you for choosing [facility] to provide your oncology and hematology care.   If you have a lab appointment with the Cancer Center, please go directly to the Cancer Center and check in at the registration area.  Wear comfortable clothing and clothing appropriate for easy access to any Portacath or PICC line.   We strive to give you quality time with your provider. You may need to reschedule your appointment if you arrive late (15 or more minutes).  Arriving late affects you and other patients whose appointments are after yours.  Also, if you miss three or more appointments without notifying the office, you may be dismissed from the clinic at the provider's discretion.      For prescription refill requests, have your pharmacy contact our office and allow 72 hours for refills to be completed.    Today you received the following chemotherapy and/or immunotherapy agents Opdivio.   To help prevent nausea and vomiting after your treatment, we encourage you to take your nausea medication as directed.  BELOW ARE SYMPTOMS THAT SHOULD BE REPORTED IMMEDIATELY: *FEVER GREATER THAN 100.4 F (38 C) OR HIGHER *CHILLS OR SWEATING *NAUSEA AND VOMITING THAT IS NOT CONTROLLED WITH YOUR NAUSEA MEDICATION *UNUSUAL SHORTNESS OF BREATH *UNUSUAL BRUISING OR BLEEDING *URINARY PROBLEMS (pain or burning when urinating, or frequent urination) *BOWEL PROBLEMS (unusual diarrhea, constipation, pain near the anus) TENDERNESS IN MOUTH AND THROAT WITH OR WITHOUT PRESENCE OF ULCERS (sore throat, sores in mouth, or a toothache) UNUSUAL RASH, SWELLING OR PAIN  UNUSUAL VAGINAL DISCHARGE OR ITCHING   Items with * indicate a potential emergency and should be followed up as soon as possible or go to the Emergency Department if any problems should occur.  Please show the CHEMOTHERAPY ALERT CARD or IMMUNOTHERAPY ALERT  CARD at check-in to the Emergency Department and triage nurse. Should you have questions after your visit or need to cancel or reschedule your appointment, please contact [facility] - A DEPT OF [facility]  [phone] and follow the prompts.  Office hours are 8:00 a.m. to 4:30 p.m. Monday - Friday. Please note that voicemails left after 4:00 p.m. may not be returned until the following business day.  We are closed weekends and major holidays. You have access to a nurse at all times for urgent questions. Please call the main number to the clinic [phone] and follow the prompts.  For any non-urgent questions, you may also contact your provider using MyChart. We now offer e-Visits for anyone 73 and older to request care online for non-urgent symptoms. For details visit [email or web address].   Also download the MyChart app! Go to the app store, search MyChart, open the app, select [place], and log in with your MyChart username and password.

## 2024-05-19 LAB — T4: T4, Total: 6 ug/dL (ref 4.5–12.0)

## 2024-06-15 ENCOUNTER — Inpatient Hospital Stay

## 2024-06-15 ENCOUNTER — Encounter: Payer: Self-pay | Admitting: Hematology & Oncology

## 2024-06-15 ENCOUNTER — Inpatient Hospital Stay: Admitting: Hematology & Oncology

## 2024-06-15 VITALS — BP 135/82 | HR 63 | Resp 18

## 2024-06-15 VITALS — BP 144/88 | HR 62 | Temp 97.6°F | Resp 18 | Ht 70.0 in | Wt 295.0 lb

## 2024-06-15 DIAGNOSIS — C3491 Malignant neoplasm of unspecified part of right bronchus or lung: Secondary | ICD-10-CM

## 2024-06-15 DIAGNOSIS — Z5112 Encounter for antineoplastic immunotherapy: Secondary | ICD-10-CM | POA: Diagnosis not present

## 2024-06-15 LAB — CMP (CANCER CENTER ONLY)
ALT: 25 U/L (ref 0–44)
AST: 21 U/L (ref 15–41)
Albumin: 4.2 g/dL (ref 3.5–5.0)
Alkaline Phosphatase: 77 U/L (ref 38–126)
Anion gap: 11 (ref 5–15)
BUN: 12 mg/dL (ref 6–20)
CO2: 27 mmol/L (ref 22–32)
Calcium: 8.7 mg/dL — ABNORMAL LOW (ref 8.9–10.3)
Chloride: 104 mmol/L (ref 98–111)
Creatinine: 1.01 mg/dL (ref 0.61–1.24)
GFR, Estimated: >60 mL/min (ref >60)
Glucose, Bld: 104 mg/dL — ABNORMAL HIGH (ref 70–99)
Potassium: 3.3 mmol/L — ABNORMAL LOW (ref 3.5–5.1)
Sodium: 141 mmol/L (ref 135–145)
Total Bilirubin: 0.4 mg/dL (ref 0.0–1.2)
Total Protein: 7 g/dL (ref 6.5–8.1)

## 2024-06-15 LAB — CBC WITH DIFFERENTIAL (CANCER CENTER ONLY)
Abs Immature Granulocytes: 0.03 K/uL (ref 0.00–0.07)
Basophils Absolute: 0.1 K/uL (ref 0.0–0.1)
Basophils Relative: 1 %
Eosinophils Absolute: 0.2 K/uL (ref 0.0–0.5)
Eosinophils Relative: 3 %
HCT: 43.8 % (ref 39.0–52.0)
Hemoglobin: 14.9 g/dL (ref 13.0–17.0)
Immature Granulocytes: 0 %
Lymphocytes Relative: 19 %
Lymphs Abs: 1.3 K/uL (ref 0.7–4.0)
MCH: 31.6 pg (ref 26.0–34.0)
MCHC: 34 g/dL (ref 30.0–36.0)
MCV: 92.8 fL (ref 80.0–100.0)
Monocytes Absolute: 0.7 K/uL (ref 0.1–1.0)
Monocytes Relative: 10 %
Neutro Abs: 4.9 K/uL (ref 1.7–7.7)
Neutrophils Relative %: 67 %
Platelet Count: 173 K/uL (ref 150–400)
RBC: 4.72 MIL/uL (ref 4.22–5.81)
RDW: 14 % (ref 11.5–15.5)
WBC Count: 7.2 K/uL (ref 4.0–10.5)
nRBC: 0 % (ref 0.0–0.2)

## 2024-06-15 LAB — TSH: TSH: 2.25 u[IU]/mL (ref 0.350–4.500)

## 2024-06-15 MED ORDER — SODIUM CHLORIDE 0.9 % IV SOLN: Freq: Once | INTRAVENOUS | Status: AC

## 2024-06-15 MED ORDER — AMLODIPINE BESYLATE 10 MG PO TABS: 10.0000 mg | ORAL_TABLET | Freq: Every day | ORAL | 6 refills | Status: AC

## 2024-06-15 MED ORDER — SODIUM CHLORIDE 0.9 % IV SOLN
480.0000 mg | Freq: Once | INTRAVENOUS | Status: AC
  Administered 2024-06-15: 480 mg via INTRAVENOUS
  Filled 2024-06-15: qty 48

## 2024-06-15 NOTE — Patient Instructions (Signed)

## 2024-06-15 NOTE — Progress Notes (Signed)
 BP remains elevated, 144/88, monitors at work, instructed to notify PCP if it continues to run over 140/90, verbalized understanding.

## 2024-06-15 NOTE — Progress Notes (Signed)
 Hematology and Oncology Follow Up Visit  OSEIAS HORSEY [id number] 1966/04/30 58 y.o. 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 Cuffee is here today for follow-up and treatment.  He is still working full-time.  He is quite busy at work.  He has had no problems with nausea or vomiting.  He has had no problems with cough or shortness of breath.  He has had no change in bowel or bladder habits.  Diarrhea has not been an issue for him.  Blood pressure has been on the high side.  I do not see a problem with increasing his amlodipine  to 10 mg p.o. daily.  His last TSH was 2.11.  He has had no bleeding.  There has been no rashes.  He has had no headache.  I think he sees Dr. Buckley every 6 months.  Overall, I would have said that his performance status is probably ECOG 0.  Medications:  Allergies as of 06/15/2024       Reactions   Paclitaxel  Anaphylaxis   Penicillins Other (See Comments)   Unknown Reaction when he was an infant.        Medication List        Accurate as of June 15, 2024  9:43 AM. If you have any questions, ask your nurse or doctor.          STOP taking these medications    venlafaxine  75 MG tablet Commonly known as: EFFEXOR  Stopped by: Maude JONELLE Crease       TAKE these medications    amLODipine  5 MG tablet Commonly known as: NORVASC  Take 5 mg by mouth at bedtime.   clonazePAM  1 MG tablet Commonly known as: KLONOPIN  Take 1 mg by mouth 2 (two) times daily as needed for anxiety (May take additional 0.5mg  as  needed for panic attack).   levothyroxine  50 MCG tablet Commonly known as: SYNTHROID  TAKE 1 TABLET BY MOUTH DAILY BEFORE BREAKFAST   pantoprazole  40 MG tablet Commonly known as: PROTONIX  Take 1 tablet (40 mg total) by mouth daily.   potassium chloride  SA 20 MEQ tablet Commonly known as: KLOR-CON  M TAKE 1 TABLET BY MOUTH EVERY DAY   QUEtiapine  400 MG 24 hr tablet Commonly known as: SEROQUEL  XR Take 400 mg by mouth at bedtime.   sildenafil 20 MG tablet Commonly known as: REVATIO Take 20-100 mg by mouth daily as needed.   solifenacin 10 MG tablet Commonly known as: VESICARE Take 10 mg by mouth daily.   traZODone  100 MG tablet Commonly known as: DESYREL  Take 300 mg by mouth at bedtime as needed for sleep.   Trintellix 10 MG Tabs tablet Generic drug: vortioxetine HBr Take 10 mg by mouth daily.        Allergies:  Allergies  Allergen Reactions   Paclitaxel  Anaphylaxis   Penicillins Other (See Comments)    Unknown Reaction when he was an infant.    Past Medical History, Surgical history, Social history, and Family History were reviewed and updated.  Review of Systems: All other  10 point review of systems is negative.   Physical Exam:  height is 5' 10 (1.778 m) and weight is 295 lb (133.8 kg). His oral temperature is 97.6 F (36.4 C). His blood pressure is 144/88 (abnormal) and his pulse is 62. His respiration is 18 and oxygen saturation is 96%.   Wt Readings from Last 3 Encounters:  06/15/24 295 lb (133.8 kg)  05/18/24 290 lb 1.9 oz (131.6 kg)  04/20/24 288 lb 1.6 oz (130.7 kg)    Ocular: Sclerae unicteric, pupils equal, round and reactive to light Ear-nose-throat: Oropharynx clear, dentition fair Lymphatic: No cervical or supraclavicular adenopathy Lungs no rales or rhonchi, good excursion bilaterally Heart regular rate and rhythm, no murmur appreciated Abd soft, nontender, positive bowel sounds MSK no focal spinal tenderness, no joint edema Neuro:  non-focal, well-oriented, appropriate affect Breasts: Deferred   Lab Results  Component Value Date   WBC 7.2 06/15/2024   HGB 14.9 06/15/2024   HCT 43.8 06/15/2024   MCV 92.8 06/15/2024   PLT 173 06/15/2024   No results found for: FERRITIN, IRON, TIBC, UIBC, IRONPCTSAT Lab Results  Component Value Date   RBC 4.72 06/15/2024   No results found for: KPAFRELGTCHN, LAMBDASER, KAPLAMBRATIO No results found for: IGGSERUM, IGA, IGMSERUM No results found for: STEPHANY CARLOTA BENSON MARKEL EARLA JOANNIE DOC VICK, SPEI   Chemistry      Component Value Date/Time   NA 141 06/15/2024 0737   K 3.3 (L) 06/15/2024 0737   CL 104 06/15/2024 0737   CO2 27 06/15/2024 0737   BUN 12 06/15/2024 0737   CREATININE 1.01 06/15/2024 0737      Component Value Date/Time   CALCIUM 8.7 (L) 06/15/2024 0737   ALKPHOS 77 06/15/2024 0737   AST 21 06/15/2024 0737   ALT 25 06/15/2024 0737   BILITOT 0.4 06/15/2024 0737       Impression and Plan: Mr. 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 He completed his chemotherapy along with immunotherapy.   He now is on maintenance therapy with nivolumab  and tolerating well.   We have tried to set him up with another PET scan.  The last PET scan was done back in  August.    I think that this PET scan looks good, maybe we can move his appointments out to every 6 weeks.    I will plan to see him back after Thanksgiving.    Maude JONELLE Crease, MD 10/31/20259:43 AM

## 2024-06-15 NOTE — Addendum Note (Signed)
 Addended by: TIMMY COY R on: 06/15/2024 10:15 AM   Modules accepted: Orders

## 2024-06-15 NOTE — Patient Instructions (Signed)
 [facility]  Discharge Instructions: Thank you for choosing [facility] to provide your oncology and hematology care.   If you have a lab appointment with the Cancer Center, please go directly to the Cancer Center and check in at the registration area.  Wear comfortable clothing and clothing appropriate for easy access to any Portacath or PICC line.   We strive to give you quality time with your provider. You may need to reschedule your appointment if you arrive late (15 or more minutes).  Arriving late affects you and other patients whose appointments are after yours.  Also, if you miss three or more appointments without notifying the office, you may be dismissed from the clinic at the provider's discretion.      For prescription refill requests, have your pharmacy contact our office and allow 72 hours for refills to be completed.    Today you received the following chemotherapy and/or immunotherapy agents Opdivo       To help prevent nausea and vomiting after your treatment, we encourage you to take your nausea medication as directed.  BELOW ARE SYMPTOMS THAT SHOULD BE REPORTED IMMEDIATELY: *FEVER GREATER THAN 100.4 F (38 C) OR HIGHER *CHILLS OR SWEATING *NAUSEA AND VOMITING THAT IS NOT CONTROLLED WITH YOUR NAUSEA MEDICATION *UNUSUAL SHORTNESS OF BREATH *UNUSUAL BRUISING OR BLEEDING *URINARY PROBLEMS (pain or burning when urinating, or frequent urination) *BOWEL PROBLEMS (unusual diarrhea, constipation, pain near the anus) TENDERNESS IN MOUTH AND THROAT WITH OR WITHOUT PRESENCE OF ULCERS (sore throat, sores in mouth, or a toothache) UNUSUAL RASH, SWELLING OR PAIN  UNUSUAL VAGINAL DISCHARGE OR ITCHING   Items with * indicate a potential emergency and should be followed up as soon as possible or go to the Emergency Department if any problems should occur.  Please show the CHEMOTHERAPY ALERT CARD or IMMUNOTHERAPY  ALERT CARD at check-in to the Emergency Department and triage nurse. Should you have questions after your visit or need to cancel or reschedule your appointment, please contact [facility] - A DEPT OF [facility]  [phone] and follow the prompts.  Office hours are 8:00 a.m. to 4:30 p.m. Monday - Friday. Please note that voicemails left after 4:00 p.m. may not be returned until the following business day.  We are closed weekends and major holidays. You have access to a nurse at all times for urgent questions. Please call the main number to the clinic [phone] and follow the prompts.  For any non-urgent questions, you may also contact your provider using MyChart. We now offer e-Visits for anyone 77 and older to request care online for non-urgent symptoms. For details visit [email or web address].   Also download the MyChart app! Go to the app store, search "MyChart", open the app, select [place], and log in with your MyChart username and password.

## 2024-06-16 LAB — T4: T4, Total: 6.7 ug/dL (ref 4.5–12.0)

## 2024-07-06 ENCOUNTER — Encounter (HOSPITAL_COMMUNITY)
Admission: RE | Admit: 2024-07-06 | Discharge: 2024-07-06 | Disposition: A | Discharge status: Home / Self Care | Source: Ambulatory Visit | Attending: Hematology & Oncology | Admitting: Hematology & Oncology

## 2024-07-06 DIAGNOSIS — C3491 Malignant neoplasm of unspecified part of right bronchus or lung: Secondary | ICD-10-CM | POA: Diagnosis present

## 2024-07-06 LAB — GLUCOSE, CAPILLARY: Glucose-Capillary: 91 mg/dL (ref 70–99)

## 2024-07-06 MED ORDER — FLUDEOXYGLUCOSE F - 18 (FDG) INJECTION
15.0000 | Freq: Once | INTRAVENOUS | Status: AC | PRN
  Administered 2024-07-06: 14.4 via INTRAVENOUS

## 2024-07-11 ENCOUNTER — Other Ambulatory Visit: Payer: Self-pay

## 2024-07-11 ENCOUNTER — Ambulatory Visit: Payer: Self-pay | Admitting: Hematology & Oncology

## 2024-07-20 ENCOUNTER — Encounter: Payer: Self-pay | Admitting: Hematology & Oncology

## 2024-07-20 ENCOUNTER — Other Ambulatory Visit: Payer: Self-pay

## 2024-07-20 ENCOUNTER — Inpatient Hospital Stay: Admitting: Hematology & Oncology

## 2024-07-20 ENCOUNTER — Inpatient Hospital Stay: Attending: Internal Medicine

## 2024-07-20 ENCOUNTER — Inpatient Hospital Stay

## 2024-07-20 VITALS — BP 105/73 | HR 95

## 2024-07-20 VITALS — BP 104/85 | HR 140 | Temp 97.7°F | Resp 18 | Ht 70.0 in | Wt 292.0 lb

## 2024-07-20 DIAGNOSIS — C7931 Secondary malignant neoplasm of brain: Secondary | ICD-10-CM | POA: Diagnosis not present

## 2024-07-20 DIAGNOSIS — C7970 Secondary malignant neoplasm of unspecified adrenal gland: Secondary | ICD-10-CM | POA: Diagnosis not present

## 2024-07-20 DIAGNOSIS — Z5112 Encounter for antineoplastic immunotherapy: Secondary | ICD-10-CM | POA: Diagnosis present

## 2024-07-20 DIAGNOSIS — C3491 Malignant neoplasm of unspecified part of right bronchus or lung: Secondary | ICD-10-CM | POA: Diagnosis not present

## 2024-07-20 DIAGNOSIS — C3411 Malignant neoplasm of upper lobe, right bronchus or lung: Secondary | ICD-10-CM | POA: Insufficient documentation

## 2024-07-20 DIAGNOSIS — Z79620 Long term (current) use of immunosuppressive biologic: Secondary | ICD-10-CM | POA: Insufficient documentation

## 2024-07-20 LAB — CMP (CANCER CENTER ONLY)
ALT: 27 U/L (ref 0–44)
AST: 23 U/L (ref 15–41)
Albumin: 4.4 g/dL (ref 3.5–5.0)
Alkaline Phosphatase: 83 U/L (ref 38–126)
Anion gap: 11 (ref 5–15)
BUN: 14 mg/dL (ref 6–20)
CO2: 25 mmol/L (ref 22–32)
Calcium: 8.9 mg/dL (ref 8.9–10.3)
Chloride: 104 mmol/L (ref 98–111)
Creatinine: 1.14 mg/dL (ref 0.61–1.24)
GFR, Estimated: >60 mL/min (ref >60)
Glucose, Bld: 151 mg/dL — ABNORMAL HIGH (ref 70–99)
Potassium: 3.3 mmol/L — ABNORMAL LOW (ref 3.5–5.1)
Sodium: 140 mmol/L (ref 135–145)
Total Bilirubin: 0.4 mg/dL (ref 0.0–1.2)
Total Protein: 7.4 g/dL (ref 6.5–8.1)

## 2024-07-20 LAB — CBC WITH DIFFERENTIAL (CANCER CENTER ONLY)
Abs Immature Granulocytes: 0.02 K/uL (ref 0.00–0.07)
Basophils Absolute: 0.1 K/uL (ref 0.0–0.1)
Basophils Relative: 1 %
Eosinophils Absolute: 0.2 K/uL (ref 0.0–0.5)
Eosinophils Relative: 2 %
HCT: 46.9 % (ref 39.0–52.0)
Hemoglobin: 16.2 g/dL (ref 13.0–17.0)
Immature Granulocytes: 0 %
Lymphocytes Relative: 19 %
Lymphs Abs: 1.7 K/uL (ref 0.7–4.0)
MCH: 31.6 pg (ref 26.0–34.0)
MCHC: 34.5 g/dL (ref 30.0–36.0)
MCV: 91.4 fL (ref 80.0–100.0)
Monocytes Absolute: 0.8 K/uL (ref 0.1–1.0)
Monocytes Relative: 8 %
Neutro Abs: 6.4 K/uL (ref 1.7–7.7)
Neutrophils Relative %: 70 %
Platelet Count: 185 K/uL (ref 150–400)
RBC: 5.13 MIL/uL (ref 4.22–5.81)
RDW: 13.6 % (ref 11.5–15.5)
WBC Count: 9.2 K/uL (ref 4.0–10.5)
nRBC: 0 % (ref 0.0–0.2)

## 2024-07-20 LAB — LACTATE DEHYDROGENASE: LDH: 201 U/L (ref 105–235)

## 2024-07-20 LAB — TSH: TSH: 1.73 u[IU]/mL (ref 0.350–4.500)

## 2024-07-20 MED ORDER — SODIUM CHLORIDE 0.9 % IV SOLN: Freq: Once | INTRAVENOUS | Status: AC

## 2024-07-20 MED ORDER — SODIUM CHLORIDE 0.9 % IV SOLN
480.0000 mg | Freq: Once | INTRAVENOUS | Status: AC
  Administered 2024-07-20: 480 mg via INTRAVENOUS
  Filled 2024-07-20: qty 48

## 2024-07-20 NOTE — Progress Notes (Signed)
 Hematology and Oncology Follow Up Visit  MILBERN DOESCHER [id number] [birth year] 58 y.o. 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 Fleck is here today for follow-up and treatment.  He is still working full-time.  He is quite busy at work.  He works about 10-12 hours a day.  We did do a PET scan on him.  This was done on 07/06/2024.  The PET scan looks fantastic.  There is no evidence of obvious active disease.  Also happy for him.  We have been at this now for probably 3 years.  He does see Dr. Buckley for CNS disease.  He is not sure when he go back to see Dr. Buckley.  He has had no headache.  He has had no cough or shortness of breath.  He has had no nausea or vomiting.  There is been no change in bowel or bladder habits.  He has had no bleeding.  There has been no rashes.  He has had no leg swelling.  Overall, I will say that his performance status is probably ECOG 0.  Medications:  Allergies as of 07/20/2024       Reactions   Paclitaxel  Anaphylaxis   Penicillins Other (See Comments)   Unknown Reaction when he was an infant.        Medication List        Accurate as of July 20, 2024  8:15 AM. If you have any questions, ask your nurse or doctor.          amLODipine  10 MG tablet Commonly known as: NORVASC  Take 1 tablet (10 mg total) by mouth at bedtime.   clonazePAM  1 MG tablet Commonly known as: KLONOPIN  Take 1 mg by mouth 2 (two) times daily as needed for anxiety (May take additional 0.5mg  as needed for panic attack).    levothyroxine  50 MCG tablet Commonly known as: SYNTHROID  TAKE 1 TABLET BY MOUTH DAILY BEFORE BREAKFAST   pantoprazole  40 MG tablet Commonly known as: PROTONIX  Take 1 tablet (40 mg total) by mouth daily.   potassium chloride  SA 20 MEQ tablet Commonly known as: KLOR-CON  M TAKE 1 TABLET BY MOUTH EVERY DAY   QUEtiapine  400 MG 24 hr tablet Commonly known as: SEROQUEL  XR Take 400 mg by mouth at bedtime.   sildenafil 20 MG tablet Commonly known as: REVATIO Take 20-100 mg by mouth daily as needed.   solifenacin 10 MG tablet Commonly known as: VESICARE Take 10 mg by mouth daily.   traZODone  100 MG tablet Commonly known as: DESYREL  Take 300 mg by mouth at bedtime as needed for sleep.   Trintellix 10 MG Tabs tablet Generic drug: vortioxetine HBr Take 10 mg by mouth daily.        Allergies:  Allergies  Allergen Reactions   Paclitaxel  Anaphylaxis   Penicillins Other (See Comments)    Unknown Reaction when he was an infant.    Past Medical History, Surgical history, Social history, and Family History were reviewed and updated.  Review of Systems: Review of Systems  Constitutional: Negative.   HENT: Negative.    Eyes: Negative.   Respiratory: Negative.    Cardiovascular: Negative.   Gastrointestinal: Negative.   Genitourinary: Negative.   Musculoskeletal: Negative.   Skin: Negative.   Neurological: Negative.   Endo/Heme/Allergies: Negative.   Psychiatric/Behavioral: Negative.       Physical Exam:  Vital signs show temperature of 97.6.  Pulse 62.  Blood pressure 144/88.  Weight is 295 pounds.  Wt Readings from Last 3 Encounters:  06/15/24 295 lb (133.8 kg)  05/18/24 290 lb 1.9 oz (131.6 kg)  04/20/24 288 lb 1.6 oz (130.7 kg)   Physical Exam Vitals reviewed.  HENT:     Head: Normocephalic and atraumatic.  Eyes:     Pupils: Pupils are equal, round, and reactive to light.  Cardiovascular:     Rate and Rhythm: Normal rate and regular rhythm.     Heart  sounds: Normal heart sounds.  Pulmonary:     Effort: Pulmonary effort is normal.     Breath sounds: Normal breath sounds.  Abdominal:     General: Bowel sounds are normal.     Palpations: Abdomen is soft.  Musculoskeletal:        General: No tenderness or deformity. Normal range of motion.     Cervical back: Normal range of motion.  Lymphadenopathy:     Cervical: No cervical adenopathy.  Skin:    General: Skin is warm and dry.     Findings: No erythema or rash.  Neurological:     Mental Status: He is alert and oriented to person, place, and time.  Psychiatric:        Behavior: Behavior normal.        Thought Content: Thought content normal.        Judgment: Judgment normal.      Lab Results  Component Value Date   WBC 7.2 06/15/2024   HGB 14.9 06/15/2024   HCT 43.8 06/15/2024   MCV 92.8 06/15/2024   PLT 173 06/15/2024   No results found for: FERRITIN, IRON, TIBC, UIBC, IRONPCTSAT Lab Results  Component Value Date   RBC 4.72 06/15/2024   No results found for: KPAFRELGTCHN, LAMBDASER, KAPLAMBRATIO No results found for: IGGSERUM, IGA, IGMSERUM No results found for: STEPHANY CARLOTA BENSON MARKEL EARLA JOANNIE DOC VICK, SPEI   Chemistry      Component Value Date/Time   NA 141 06/15/2024 0737   K 3.3 (L) 06/15/2024 0737   CL 104 06/15/2024 0737   CO2 27 06/15/2024 0737   BUN 12 06/15/2024 0737   CREATININE 1.01 06/15/2024 0737      Component Value Date/Time   CALCIUM 8.7 (L) 06/15/2024 0737   ALKPHOS 77 06/15/2024 0737   AST 21 06/15/2024 0737   ALT 25 06/15/2024 0737   BILITOT 0.4 06/15/2024 0737       Impression and Plan: Mr. 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 He completed his chemotherapy along with immunotherapy.   He now is on maintenance therapy with nivolumab  and tolerating well.   Given that the PET scan looks  so great, we will now move his appointments out every 6 weeks.  I think this is very reasonable.  He is certainly looking forward to this.  Again he is not sure when he sees Dr. Buckley for CNS disease.  We will plan to see him back after the Holiday season.  As always, I am sure  that he will continue to work incredibly hard.    Maude JONELLE Crease, MD 12/5/20258:15 AM

## 2024-07-20 NOTE — Patient Instructions (Signed)

## 2024-07-20 NOTE — Patient Instructions (Signed)
 [facility]  Discharge Instructions: Thank you for choosing [facility] to provide your oncology and hematology care.   If you have a lab appointment with the Cancer Center, please go directly to the Cancer Center and check in at the registration area.  Wear comfortable clothing and clothing appropriate for easy access to any Portacath or PICC line.   We strive to give you quality time with your provider. You may need to reschedule your appointment if you arrive late (15 or more minutes).  Arriving late affects you and other patients whose appointments are after yours.  Also, if you miss three or more appointments without notifying the office, you may be dismissed from the clinic at the provider's discretion.      For prescription refill requests, have your pharmacy contact our office and allow 72 hours for refills to be completed.    Today you received the following chemotherapy and/or immunotherapy agents Opdivo       To help prevent nausea and vomiting after your treatment, we encourage you to take your nausea medication as directed.  BELOW ARE SYMPTOMS THAT SHOULD BE REPORTED IMMEDIATELY: *FEVER GREATER THAN 100.4 F (38 C) OR HIGHER *CHILLS OR SWEATING *NAUSEA AND VOMITING THAT IS NOT CONTROLLED WITH YOUR NAUSEA MEDICATION *UNUSUAL SHORTNESS OF BREATH *UNUSUAL BRUISING OR BLEEDING *URINARY PROBLEMS (pain or burning when urinating, or frequent urination) *BOWEL PROBLEMS (unusual diarrhea, constipation, pain near the anus) TENDERNESS IN MOUTH AND THROAT WITH OR WITHOUT PRESENCE OF ULCERS (sore throat, sores in mouth, or a toothache) UNUSUAL RASH, SWELLING OR PAIN  UNUSUAL VAGINAL DISCHARGE OR ITCHING   Items with * indicate a potential emergency and should be followed up as soon as possible or go to the Emergency Department if any problems should occur.  Please show the CHEMOTHERAPY ALERT CARD or IMMUNOTHERAPY  ALERT CARD at check-in to the Emergency Department and triage nurse. Should you have questions after your visit or need to cancel or reschedule your appointment, please contact [facility] - A DEPT OF [facility]  [phone] and follow the prompts.  Office hours are 8:00 a.m. to 4:30 p.m. Monday - Friday. Please note that voicemails left after 4:00 p.m. may not be returned until the following business day.  We are closed weekends and major holidays. You have access to a nurse at all times for urgent questions. Please call the main number to the clinic [phone] and follow the prompts.  For any non-urgent questions, you may also contact your provider using MyChart. We now offer e-Visits for anyone 77 and older to request care online for non-urgent symptoms. For details visit [email or web address].   Also download the MyChart app! Go to the app store, search "MyChart", open the app, select [place], and log in with your MyChart username and password.

## 2024-07-21 LAB — T4: T4, Total: 6.9 ug/dL (ref 4.5–12.0)

## 2024-08-01 ENCOUNTER — Other Ambulatory Visit: Payer: Self-pay | Admitting: Hematology & Oncology

## 2024-08-19 ENCOUNTER — Encounter: Payer: Self-pay | Admitting: Hematology & Oncology

## 2024-08-31 ENCOUNTER — Inpatient Hospital Stay: Attending: Internal Medicine

## 2024-08-31 ENCOUNTER — Inpatient Hospital Stay: Admitting: Family

## 2024-08-31 ENCOUNTER — Inpatient Hospital Stay

## 2024-08-31 VITALS — BP 146/89 | HR 70

## 2024-08-31 VITALS — BP 128/75 | HR 70 | Temp 97.7°F | Resp 18 | Ht 70.0 in | Wt 291.0 lb

## 2024-08-31 DIAGNOSIS — Z79620 Long term (current) use of immunosuppressive biologic: Secondary | ICD-10-CM | POA: Diagnosis not present

## 2024-08-31 DIAGNOSIS — C3411 Malignant neoplasm of upper lobe, right bronchus or lung: Secondary | ICD-10-CM | POA: Diagnosis present

## 2024-08-31 DIAGNOSIS — C7970 Secondary malignant neoplasm of unspecified adrenal gland: Secondary | ICD-10-CM | POA: Diagnosis not present

## 2024-08-31 DIAGNOSIS — Z5112 Encounter for antineoplastic immunotherapy: Secondary | ICD-10-CM | POA: Diagnosis present

## 2024-08-31 DIAGNOSIS — E032 Hypothyroidism due to medicaments and other exogenous substances: Secondary | ICD-10-CM

## 2024-08-31 DIAGNOSIS — C3491 Malignant neoplasm of unspecified part of right bronchus or lung: Secondary | ICD-10-CM

## 2024-08-31 DIAGNOSIS — C3490 Malignant neoplasm of unspecified part of unspecified bronchus or lung: Secondary | ICD-10-CM | POA: Diagnosis not present

## 2024-08-31 DIAGNOSIS — C7931 Secondary malignant neoplasm of brain: Secondary | ICD-10-CM

## 2024-08-31 LAB — CBC WITH DIFFERENTIAL (CANCER CENTER ONLY)
Abs Immature Granulocytes: 0.03 K/uL (ref 0.00–0.07)
Basophils Absolute: 0.1 K/uL (ref 0.0–0.1)
Basophils Relative: 1 %
Eosinophils Absolute: 0.2 K/uL (ref 0.0–0.5)
Eosinophils Relative: 2 %
HCT: 44.9 % (ref 39.0–52.0)
Hemoglobin: 15.4 g/dL (ref 13.0–17.0)
Immature Granulocytes: 0 %
Lymphocytes Relative: 17 %
Lymphs Abs: 1.3 K/uL (ref 0.7–4.0)
MCH: 31.2 pg (ref 26.0–34.0)
MCHC: 34.3 g/dL (ref 30.0–36.0)
MCV: 91.1 fL (ref 80.0–100.0)
Monocytes Absolute: 0.7 K/uL (ref 0.1–1.0)
Monocytes Relative: 9 %
Neutro Abs: 5.5 K/uL (ref 1.7–7.7)
Neutrophils Relative %: 71 %
Platelet Count: 183 K/uL (ref 150–400)
RBC: 4.93 MIL/uL (ref 4.22–5.81)
RDW: 14 % (ref 11.5–15.5)
WBC Count: 7.8 K/uL (ref 4.0–10.5)
nRBC: 0 % (ref 0.0–0.2)

## 2024-08-31 LAB — CMP (CANCER CENTER ONLY)
ALT: 24 U/L (ref 0–44)
AST: 18 U/L (ref 15–41)
Albumin: 4.3 g/dL (ref 3.5–5.0)
Alkaline Phosphatase: 72 U/L (ref 38–126)
Anion gap: 9 (ref 5–15)
BUN: 11 mg/dL (ref 6–20)
CO2: 28 mmol/L (ref 22–32)
Calcium: 9 mg/dL (ref 8.9–10.3)
Chloride: 104 mmol/L (ref 98–111)
Creatinine: 1.11 mg/dL (ref 0.61–1.24)
GFR, Estimated: >60 mL/min
Glucose, Bld: 106 mg/dL — ABNORMAL HIGH (ref 70–99)
Potassium: 3.1 mmol/L — ABNORMAL LOW (ref 3.5–5.1)
Sodium: 141 mmol/L (ref 135–145)
Total Bilirubin: 0.4 mg/dL (ref 0.0–1.2)
Total Protein: 7.3 g/dL (ref 6.5–8.1)

## 2024-08-31 LAB — TSH: TSH: 1.87 u[IU]/mL (ref 0.350–4.500)

## 2024-08-31 MED ORDER — SODIUM CHLORIDE 0.9 % IV SOLN: Freq: Once | INTRAVENOUS | Status: AC

## 2024-08-31 MED ORDER — SODIUM CHLORIDE 0.9 % IV SOLN
480.0000 mg | Freq: Once | INTRAVENOUS | Status: AC
  Administered 2024-08-31: 480 mg via INTRAVENOUS
  Filled 2024-08-31: qty 48

## 2024-08-31 NOTE — Progress Notes (Signed)
 " Hematology and Oncology Follow Up Visit  Kevin Hunter [id number] [birth year] 59 y.o. 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 is here today for follow-up and treatment. He continues to do well and has no complaints at this time. He is staying busy with work and had a nice relaxing Christmas with his family.  No issue with fever, chills, n/v, cough, rash, dizziness, SOB, chest pain, palpitations, abdominal pain or changes in bowel or bladder habits.  No swelling, tenderness, numbness or tingling in his extremities.  No falls or syncope.  Appetite and hydration are good. Weight is stable at 291 lbs.   ECOG Performance Status: 0 - Asymptomatic  Medications:  Allergies as of 08/31/2024       Reactions   Paclitaxel  Anaphylaxis   Penicillins Other (See Comments)   Unknown Reaction when he was an infant.        Medication List        Accurate as of August 31, 2024  8:49 AM. If you have any questions, ask your nurse or doctor.          amLODipine  10 MG tablet Commonly known as: NORVASC  Take 1 tablet (10 mg total) by mouth at bedtime.   clonazePAM  1 MG tablet Commonly known as: KLONOPIN  Take 1 mg by mouth 2 (two) times daily as needed for anxiety (May take additional 0.5mg  as needed for panic attack).   levothyroxine  50 MCG tablet Commonly known as: SYNTHROID  TAKE 1 TABLET BY MOUTH EVERY DAY BEFORE BREAKFAST   pantoprazole  40 MG tablet Commonly known as: PROTONIX  Take 1 tablet (40 mg total) by mouth daily.   potassium chloride  SA  20 MEQ tablet Commonly known as: KLOR-CON  M TAKE 1 TABLET BY MOUTH EVERY DAY   QUEtiapine  400 MG 24 hr tablet Commonly known as: SEROQUEL  XR Take 400 mg by mouth at bedtime.   sildenafil 20 MG tablet Commonly known as: REVATIO Take 20-100 mg by mouth daily as needed.   solifenacin 10 MG tablet Commonly known as: VESICARE Take 10 mg by mouth daily.   traZODone  100 MG tablet Commonly known as: DESYREL  Take 300 mg by mouth at bedtime as needed for sleep.   Trintellix 10 MG Tabs tablet Generic drug: vortioxetine HBr Take 10 mg by mouth daily.        Allergies: Allergies[1]  Past Medical History, Surgical history, Social history, and Family History were reviewed and updated.  Review of Systems: All other 10 point review of systems is negative.   Physical Exam:  height is 5' 10 (1.778 m) and weight is 291 lb (132 kg). His oral temperature is 97.7 F (36.5 C). His blood pressure is 128/75 and his pulse is 70. His respiration is 18 and oxygen saturation is 95%.   Wt Readings from Last 3 Encounters:  08/31/24 291 lb (132 kg)  07/20/24 292 lb (132.5 kg)  06/15/24 295 lb (133.8 kg)    Ocular: Sclerae unicteric, pupils equal, round and reactive to light Ear-nose-throat: Oropharynx clear, dentition fair Lymphatic: No cervical  or supraclavicular adenopathy Lungs no rales or rhonchi, good excursion bilaterally Heart regular rate and rhythm, no murmur appreciated Abd soft, nontender, positive bowel sounds MSK no focal spinal tenderness, no joint edema Neuro: non-focal, well-oriented, appropriate affect Breasts: Deferred   Lab Results  Component Value Date   WBC 7.8 08/31/2024   HGB 15.4 08/31/2024   HCT 44.9 08/31/2024   MCV 91.1 08/31/2024   PLT 183 08/31/2024   No results found for: FERRITIN, IRON, TIBC, UIBC, IRONPCTSAT Lab Results  Component Value Date   RBC 4.93 08/31/2024   No results found for: KPAFRELGTCHN, LAMBDASER, KAPLAMBRATIO No  results found for: IGGSERUM, IGA, IGMSERUM No results found for: STEPHANY CARLOTA BENSON MARKEL EARLA JOANNIE DOC VICK, SPEI   Chemistry      Component Value Date/Time   NA 141 08/31/2024 0732   K 3.1 (L) 08/31/2024 0732   CL 104 08/31/2024 0732   CO2 28 08/31/2024 0732   BUN 11 08/31/2024 0732   CREATININE 1.11 08/31/2024 0732      Component Value Date/Time   CALCIUM 9.0 08/31/2024 0732   ALKPHOS 72 08/31/2024 0732   AST 18 08/31/2024 0732   ALT 24 08/31/2024 0732   BILITOT 0.4 08/31/2024 0732       Impression and Plan: 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 He completed his chemotherapy along with immunotherapy.  He now is on maintenance therapy with nivolumab  and tolerating well.  Thyroid  studies pending.  PET scan in November showed continued response.  Follow-up in 6 weeks.   Lauraine Pepper, NP 1/16/20268:49 AM     [1]  Allergies Allergen Reactions   Paclitaxel  Anaphylaxis   Penicillins Other (See Comments)    Unknown Reaction when he was an infant.   "

## 2024-08-31 NOTE — Patient Instructions (Signed)
 Nivolumab Injection What is this medication? NIVOLUMAB (nye VOL ue mab) treats some types of cancer. It works by helping your immune system slow or stop the spread of cancer cells. It is a monoclonal antibody. This medicine may be used for other purposes; ask your health care provider or pharmacist if you have questions. COMMON BRAND NAME(S): Opdivo What should I tell my care team before I take this medication? They need to know if you have any of these conditions: Allogeneic stem cell transplant (uses someone else's stem cells) Autoimmune diseases, such as Crohn disease, ulcerative colitis, lupus History of chest radiation Nervous system problems, such as Guillain-Barre syndrome or myasthenia gravis Organ transplant An unusual or allergic reaction to nivolumab, other medications, foods, dyes, or preservatives Pregnant or trying to get pregnant Breast-feeding How should I use this medication? This medication is infused into a vein. It is given in a hospital or clinic setting. A special MedGuide will be given to you before each treatment. Be sure to read this information carefully each time. Talk to your care team about the use of this medication in children. While it may be prescribed for children as young as 12 years for selected conditions, precautions do apply. Overdosage: If you think you have taken too much of this medicine contact a poison control center or emergency room at once. NOTE: This medicine is only for you. Do not share this medicine with others. What if I miss a dose? Keep appointments for follow-up doses. It is important not to miss your dose. Call your care team if you are unable to keep an appointment. What may interact with this medication? Interactions have not been studied. This list may not describe all possible interactions. Give your health care provider a list of all the medicines, herbs, non-prescription drugs, or dietary supplements you use. Also tell them if you  smoke, drink alcohol, or use illegal drugs. Some items may interact with your medicine. What should I watch for while using this medication? Your condition will be monitored carefully while you are receiving this medication. You may need blood work while taking this medication. This medication may cause serious skin reactions. They can happen weeks to months after starting the medication. Contact your care team right away if you notice fevers or flu-like symptoms with a rash. The rash may be red or purple and then turn into blisters or peeling of the skin. You may also notice a red rash with swelling of the face, lips, or lymph nodes in your neck or under your arms. Tell your care team right away if you have any change in your eyesight. Talk to your care team if you are pregnant or think you might be pregnant. A negative pregnancy test is required before starting this medication. A reliable form of contraception is recommended while taking this medication and for 5 months after the last dose. Talk to your care team about effective forms of contraception. Do not breast-feed while taking this medication and for 5 months after the last dose. What side effects may I notice from receiving this medication? Side effects that you should report to your care team as soon as possible: Allergic reactions--skin rash, itching, hives, swelling of the face, lips, tongue, or throat Dry cough, shortness of breath or trouble breathing Eye pain, redness, irritation, or discharge with blurry or decreased vision Heart muscle inflammation--unusual weakness or fatigue, shortness of breath, chest pain, fast or irregular heartbeat, dizziness, swelling of the ankles, feet, or hands Hormone  gland problems--headache, sensitivity to light, unusual weakness or fatigue, dizziness, fast or irregular heartbeat, increased sensitivity to cold or heat, excessive sweating, constipation, hair loss, increased thirst or amount of urine,  tremors or shaking, irritability Infusion reactions--chest pain, shortness of breath or trouble breathing, feeling faint or lightheaded Kidney injury (glomerulonephritis)--decrease in the amount of urine, red or dark brown urine, foamy or bubbly urine, swelling of the ankles, hands, or feet Liver injury--right upper belly pain, loss of appetite, nausea, light-colored stool, dark yellow or brown urine, yellowing skin or eyes, unusual weakness or fatigue Pain, tingling, or numbness in the hands or feet, muscle weakness, change in vision, confusion or trouble speaking, loss of balance or coordination, trouble walking, seizures Rash, fever, and swollen lymph nodes Redness, blistering, peeling, or loosening of the skin, including inside the mouth Sudden or severe stomach pain, bloody diarrhea, fever, nausea, vomiting Side effects that usually do not require medical attention (report these to your care team if they continue or are bothersome): Bone, joint, or muscle pain Diarrhea Fatigue Loss of appetite Nausea Skin rash This list may not describe all possible side effects. Call your doctor for medical advice about side effects. You may report side effects to FDA at 1-800-FDA-1088. Where should I keep my medication? This medication is given in a hospital or clinic. It will not be stored at home. NOTE: This sheet is a summary. It may not cover all possible information. If you have questions about this medicine, talk to your doctor, pharmacist, or health care provider.  2024 Elsevier/Gold Standard (2021-11-30 00:00:00)

## 2024-08-31 NOTE — Patient Instructions (Signed)

## 2024-09-01 LAB — T4: T4, Total: 6.5 ug/dL (ref 4.5–12.0)

## 2024-10-12 ENCOUNTER — Inpatient Hospital Stay: Attending: Internal Medicine

## 2024-10-12 ENCOUNTER — Inpatient Hospital Stay: Admitting: Hematology & Oncology

## 2024-10-12 ENCOUNTER — Inpatient Hospital Stay

## 2024-11-23 ENCOUNTER — Inpatient Hospital Stay: Admitting: Family

## 2024-11-23 ENCOUNTER — Inpatient Hospital Stay

## 2024-11-23 ENCOUNTER — Inpatient Hospital Stay: Attending: Internal Medicine

## 2024-12-31 ENCOUNTER — Ambulatory Visit: Payer: Self-pay | Admitting: Internal Medicine
